# Patient Record
Sex: Male | Born: 1945 | Race: White | Hispanic: No | Marital: Married | Smoking: Never smoker
Health system: Southern US, Community
[De-identification: ages and names within clinical notes are randomized; demographics above are authoritative.]

## PROBLEM LIST (undated history)

## (undated) DIAGNOSIS — N200 Calculus of kidney: Secondary | ICD-10-CM

## (undated) DIAGNOSIS — I10 Essential (primary) hypertension: Secondary | ICD-10-CM

## (undated) DIAGNOSIS — M109 Gout, unspecified: Secondary | ICD-10-CM

## (undated) DIAGNOSIS — M1990 Unspecified osteoarthritis, unspecified site: Secondary | ICD-10-CM

## (undated) DIAGNOSIS — C4490 Unspecified malignant neoplasm of skin, unspecified: Secondary | ICD-10-CM

## (undated) DIAGNOSIS — C7951 Secondary malignant neoplasm of bone: Secondary | ICD-10-CM

## (undated) HISTORY — DX: Unspecified osteoarthritis, unspecified site: M19.90

## (undated) HISTORY — DX: Essential (primary) hypertension: I10

## (undated) HISTORY — PX: OTHER SURGICAL HISTORY: SHX169

## (undated) HISTORY — DX: Calculus of kidney: N20.0

## (undated) HISTORY — DX: Unspecified malignant neoplasm of skin, unspecified: C44.90

## (undated) HISTORY — DX: Gout, unspecified: M10.9

## (undated) HISTORY — DX: Secondary malignant neoplasm of bone: C79.51

---

## 2011-12-07 ENCOUNTER — Encounter: Payer: Self-pay | Admitting: Cardiology

## 2011-12-20 ENCOUNTER — Ambulatory Visit (INDEPENDENT_AMBULATORY_CARE_PROVIDER_SITE_OTHER): Payer: Medicare Other | Admitting: Cardiology

## 2011-12-20 ENCOUNTER — Telehealth: Payer: Self-pay

## 2011-12-20 ENCOUNTER — Encounter: Payer: Self-pay | Admitting: *Deleted

## 2011-12-20 ENCOUNTER — Other Ambulatory Visit: Payer: Self-pay | Admitting: *Deleted

## 2011-12-20 ENCOUNTER — Encounter: Payer: Self-pay | Admitting: Cardiology

## 2011-12-20 VITALS — BP 129/78 | HR 144 | Ht 72.0 in | Wt 261.0 lb

## 2011-12-20 DIAGNOSIS — Z136 Encounter for screening for cardiovascular disorders: Secondary | ICD-10-CM

## 2011-12-20 DIAGNOSIS — I4891 Unspecified atrial fibrillation: Secondary | ICD-10-CM

## 2011-12-20 MED ORDER — RIVAROXABAN 15 MG PO TABS: 15.0000 mg | ORAL_TABLET | Freq: Every day | ORAL | Status: DC

## 2011-12-20 MED ORDER — DILTIAZEM HCL ER COATED BEADS 180 MG PO CP24: 180.0000 mg | ORAL_CAPSULE | Freq: Every day | ORAL | Status: DC

## 2011-12-20 NOTE — Progress Notes (Addendum)
HPI The patient presents for evaluation of atrial fibrillation. This was newly diagnosed in early September by his primary provider. He was started on Xarelto at that time. This was found incidentally at the time of her routine followup. Even in retrospect he does not feel palpitations, presyncope or syncope. He pushes a lawnmower a couple of times weekly. With this he denies any symptoms with decreased exercise tolerance. He has no shortness of breath, PND or orthopnea. He denies any chest pressure, neck or arm discomfort. He has had no weight gain or edema. He has had no prior cardiac workup. He tolerates the anticoagulation without any suggestion of GI bleeding.   No Known Allergies  Current Outpatient Prescriptions  Medication Sig Dispense Refill  . allopurinol (ZYLOPRIM) 300 MG tablet Take 300 mg by mouth daily.      Marland Kitchen amLODipine (NORVASC) 10 MG tablet Take 10 mg by mouth daily.      . flurazepam (DALMANE) 30 MG capsule Take 30 mg by mouth at bedtime.      . gabapentin (NEURONTIN) 300 MG capsule Take 300 mg by mouth 3 (three) times daily.      Marland Kitchen lisinopril (PRINIVIL,ZESTRIL) 40 MG tablet Take 40 mg by mouth 2 (two) times daily.      . Melatonin 5 MG CAPS Take 10 mg by mouth daily.      . Multiple Vitamin (MULTIVITAMIN) tablet Take 1 tablet by mouth daily.      . Rivaroxaban (XARELTO) 15 MG TABS tablet Take 15 mg by mouth daily.        Past Medical History  Diagnosis Date  . Arthritis, degenerative   . Gout, unspecified   . Unspecified essential hypertension   . Calculus of kidney     Past Surgical History  Procedure Date  . None     Family History  Problem Relation Age of Onset  . Heart disease Mother   . Colon cancer Mother   . Alcohol abuse Father     Cause of death is Suicide    History   Social History  . Marital Status: Married    Spouse Name: N/A    Number of Children: 1  . Years of Education: N/A   Occupational History  . Retired    Social History  Main Topics  . Smoking status: Never Smoker   . Smokeless tobacco: Not on file  . Alcohol Use: No  . Drug Use: No  . Sexually Active: Not on file   Other Topics Concern  . Not on file   Social History Narrative  . No narrative on file    ROS:  Positive for seasonal allergies , easy bruising, foot pain, decreased sleep.   Otherwise as stated in the HPI and negative for all other systems.  PHYSICAL EXAM BP 129/78  Pulse 144  Ht 6' (1.829 m)  Wt 118.389 kg (261 lb)  BMI 35.40 kg/m2 GENERAL:  Well appearing HEENT:  Pupils equal round and reactive, fundi not visualized, oral mucosa unremarkable, upper dentures NECK:  No jugular venous distention, waveform within normal limits, carotid upstroke brisk and symmetric, no bruits, no thyromegaly LYMPHATICS:  No cervical, inguinal adenopathy LUNGS:  Clear to auscultation bilaterally BACK:  No CVA tenderness CHEST:  Unremarkable HEART:  PMI not displaced or sustained,S1 and S2 within normal limits, no S3,no clicks, no rubs, no murmurs, irregular ABD:  Flat, positive bowel sounds normal in frequency in pitch, no bruits, no rebound, no guarding, no midline pulsatile  mass, no hepatomegaly, no splenomegaly, obese EXT:  2 plus pulses throughout, no edema, no cyanosis no clubbing SKIN:  No rashes no nodules NEURO:  Cranial nerves II through XII grossly intact, motor grossly intact throughout PSYCH:  Cognitively intact, oriented to person place and time  EKG:  Atrial fibrillation, rate 144, axis WNL, intervals WNL. Low voltage. Poor anterior R wave progression, nonspecific ST-T wave changes.  12/20/2011  ASSESSMENT AND PLAN   Atrial fibrillation - We had a long discussion about this. I will plan cardioversion. He has been on Xarelto since early September. I will first change Norvasc to Cardizem 180 mg daily. I will check an echocardiogram. He will be scheduled for elective cardioversion. I have discussed the risks benefits of this. Of note his  CHADS score is only one and he would likely not need long-term anticoagulation.  HTN - His blood pressure will be treated in the context of rate control for his arrhythmia.  Obesity - I will begin to educate him about weight loss with diet and exercise.

## 2011-12-20 NOTE — Telephone Encounter (Signed)
No precert required 

## 2011-12-20 NOTE — Telephone Encounter (Signed)
:    DCCV on 12/29/11 with Hochrein at [facility] dx:[id number]

## 2011-12-20 NOTE — Patient Instructions (Addendum)
Your physician recommends that you schedule a follow-up appointment after cardioversion completed. Your physician has recommended you make the following change in your medication: Stop norvasc and start cardizem cd 180 mg daily. All other medications will remain the same. Your new prescription has been sent to your pharmacy. Your physician has requested that you have an echocardiogram. Echocardiography is a painless test that uses sound waves to create images of your heart. It provides your doctor with information about the size and shape of your heart and how well your heart's chambers and valves are working. This procedure takes approximately one hour. There are no restrictions for this procedure. Your physician has recommended that you have a Cardioversion (DCCV). Electrical Cardioversion uses a jolt of electricity to your heart either through paddles or wired patches attached to your chest. This is a controlled, usually prescheduled, procedure. Defibrillation is done under light anesthesia in the hospital, and you usually go home the day of the procedure. This is done to get your heart back into a normal rhythm. You are not awake for the procedure. Please see the instruction sheet given to you today.

## 2011-12-23 ENCOUNTER — Other Ambulatory Visit: Payer: Self-pay

## 2011-12-23 ENCOUNTER — Other Ambulatory Visit (INDEPENDENT_AMBULATORY_CARE_PROVIDER_SITE_OTHER): Payer: Medicare Other

## 2011-12-23 DIAGNOSIS — Z136 Encounter for screening for cardiovascular disorders: Secondary | ICD-10-CM

## 2011-12-23 DIAGNOSIS — I4891 Unspecified atrial fibrillation: Secondary | ICD-10-CM

## 2011-12-27 ENCOUNTER — Telehealth: Payer: Self-pay | Admitting: Cardiology

## 2011-12-27 ENCOUNTER — Telehealth: Payer: Self-pay | Admitting: *Deleted

## 2011-12-27 NOTE — Telephone Encounter (Signed)
Patient said that he is schedule to have procedure at hospital at 9 on Oct 16th and got a call that he is scheduled to see Hochrein 10/16 @ 300  Patient would like to know if he needs to come to Doctor appt

## 2011-12-27 NOTE — Telephone Encounter (Signed)
Patient informed and copy sent to PCP. 

## 2011-12-27 NOTE — Telephone Encounter (Signed)
Spoke with [name]. He understands about appointment for 12-29-2011.

## 2011-12-27 NOTE — Telephone Encounter (Signed)
Message copied by Eustace Moore on Mon Dec 27, 2011  3:45 PM ------      Message from: Meredeth Ide, PAMELA J      Created: Mon Dec 27, 2011 10:18 AM                   ----- Message -----         From: Rollene Rotunda, MD         Sent: 12/27/2011   1:12 AM           To: Rocco Serene, RN            His LA is large but otherwise no acute abnormalities.  Call Mr. Episcopo with the results and send results to [place], [place]

## 2011-12-29 ENCOUNTER — Ambulatory Visit: Payer: Medicare Other | Admitting: Cardiology

## 2011-12-29 DIAGNOSIS — I4891 Unspecified atrial fibrillation: Secondary | ICD-10-CM

## 2011-12-30 ENCOUNTER — Telehealth: Payer: Self-pay | Admitting: *Deleted

## 2011-12-30 NOTE — Telephone Encounter (Signed)
Per Judeth Cornfield, patient doesn't want to be seen at the [facility] office. Awaiting response from MD about whom to schedule patient with.

## 2011-12-30 NOTE — Telephone Encounter (Signed)
Staff message sent to scheduler to call pt to see if he can come in tomorrow afternoon to see Dr Antoine Poche.

## 2011-12-31 NOTE — Telephone Encounter (Signed)
Have somebody double book with me per Dr. Antoine Poche. Spoke with Meka at [facility]. Office and was given appointment for Friday 01/07/12 @4 :15pm. Nurse called and informed wife this morning.

## 2012-01-03 ENCOUNTER — Telehealth: Payer: Self-pay | Admitting: *Deleted

## 2012-01-03 NOTE — Telephone Encounter (Signed)
Message copied by Eustace Moore on Mon Jan 03, 2012  3:21 PM ------      Message from: Rollene Rotunda      Created: Fri Dec 31, 2011  2:12 PM       [facility] [place].  Call Mr. Carothers with the results and send results to [place], [place]

## 2012-01-03 NOTE — Telephone Encounter (Signed)
Patient informed via message machine. Copy sent to PCP.

## 2012-01-07 ENCOUNTER — Ambulatory Visit (INDEPENDENT_AMBULATORY_CARE_PROVIDER_SITE_OTHER): Payer: Medicare Other | Admitting: Cardiology

## 2012-01-07 ENCOUNTER — Encounter: Payer: Self-pay | Admitting: Cardiology

## 2012-01-07 VITALS — BP 152/94 | HR 104 | Ht 72.0 in | Wt 264.0 lb

## 2012-01-07 DIAGNOSIS — I4891 Unspecified atrial fibrillation: Secondary | ICD-10-CM

## 2012-01-07 MED ORDER — DILTIAZEM HCL ER COATED BEADS 240 MG PO CP24: 240.0000 mg | ORAL_CAPSULE | Freq: Every day | ORAL | Status: DC

## 2012-01-07 NOTE — Patient Instructions (Addendum)
Please increase your Diltiazem to 240 mg a day. Continue all other medications as listed.  Your physician has recommended that you wear a holter monitor in 2 weeks.  This will be placed in the [place] office.  Holter monitors are medical devices that record the heart's electrical activity. Doctors most often use these monitors to diagnose arrhythmias. Arrhythmias are problems with the speed or rhythm of the heartbeat. The monitor is a small, portable device. You can wear one while you do your normal daily activities. This is usually used to diagnose what is causing palpitations/syncope (passing out).  Follow up with Dr Antoine Poche in [place] in 2 months.

## 2012-01-07 NOTE — Progress Notes (Signed)
HPI The patient presents for evaluation of atrial fibrillation. Unfortunately he failed cardioversion recently. He does not feel palpitations. He has no presyncope or syncope. He doesn't think it has to tiredness. He has some chronic dyspnea with exertion but this is unchanged. He's not having any PND or orthopnea. He has had no weight gain or edema. He has had no chest pressure, neck or arm discomfort. He does have stress at home.  No Known Allergies  Current Outpatient Prescriptions  Medication Sig Dispense Refill  . allopurinol (ZYLOPRIM) 300 MG tablet Take 300 mg by mouth daily.      Marland Kitchen diltiazem (CARDIZEM CD) 180 MG 24 hr capsule Take 1 capsule (180 mg total) by mouth daily.  30 capsule  6  . flurazepam (DALMANE) 30 MG capsule Take 30 mg by mouth at bedtime.      . gabapentin (NEURONTIN) 300 MG capsule Take 300 mg by mouth 3 (three) times daily.      Marland Kitchen lisinopril (PRINIVIL,ZESTRIL) 40 MG tablet Take 40 mg by mouth 2 (two) times daily.      . Melatonin 5 MG CAPS Take 10 mg by mouth daily.      . Multiple Vitamin (MULTIVITAMIN) tablet Take 1 tablet by mouth daily.      . Rivaroxaban (XARELTO) 15 MG TABS tablet Take 1 tablet (15 mg total) by mouth daily.  15 tablet  0  . Specialty Vitamins Products (MAGNESIUM, AMINO ACID CHELATE,) 133 MG tablet Take 1 tablet by mouth daily.        Past Medical History  Diagnosis Date  . Arthritis, degenerative   . Gout, unspecified   . Unspecified essential hypertension   . Calculus of kidney     Past Surgical History  Procedure Date  . None     ROS:  Positive for seasonal allergies , easy bruising, foot pain, decreased sleep.   Otherwise as stated in the HPI and negative for all other systems.  PHYSICAL EXAM BP 152/94  Pulse 104  Ht 6' (1.829 m)  Wt 264 lb (119.75 kg)  BMI 35.80 kg/m2 GENERAL:  Well appearing HEENT:  Pupils equal round and reactive, fundi not visualized, oral mucosa unremarkable, upper dentures NECK:  No jugular venous  distention, waveform within normal limits, carotid upstroke brisk and symmetric, no bruits, no thyromegaly LUNGS:  Clear to auscultation bilaterally BACK:  No CVA tenderness CHEST:  Unremarkable HEART:  PMI not displaced or sustained,S1 and S2 within normal limits, no S3,no clicks, no rubs, no murmurs, irregular ABD:  Flat, positive bowel sounds normal in frequency in pitch, no bruits, no rebound, no guarding, no midline pulsatile mass, no hepatomegaly, no splenomegaly, obese EXT:  2 plus pulses throughout, no edema, no cyanosis no clubbing SKIN:  Ruddy   ASSESSMENT AND PLAN   Atrial fibrillation - I had a long discussion with the patient about this. His CHADS score is only 1 giving him a low thromboembolic risk. At this point this does not require anticoagulation though it can still be an elective consideration. We will choose to come off of the Xarelto.  We discussed rhythm versus rate management. Since he has no symptoms related to this he would prefer rate management. I will increase his Cardizem to 240 mg daily. He will get a Holter monitor in 2 weeks to judge rate control. I will see him back after this.  HTN - This is being treated in the context of treating his atrial fibrillation.  Obesity - We will  continue to discuss weight control.

## 2012-01-19 ENCOUNTER — Encounter: Payer: Medicare Other | Admitting: Cardiology

## 2012-01-24 ENCOUNTER — Encounter (INDEPENDENT_AMBULATORY_CARE_PROVIDER_SITE_OTHER): Payer: Medicare Other | Admitting: *Deleted

## 2012-01-24 DIAGNOSIS — I4891 Unspecified atrial fibrillation: Secondary | ICD-10-CM

## 2012-01-24 NOTE — Progress Notes (Unsigned)
Patient in office this morning.  24 hour holter monitor placed & instructions given.

## 2012-01-26 ENCOUNTER — Encounter: Payer: Medicare Other | Admitting: Cardiology

## 2012-02-07 ENCOUNTER — Telehealth: Payer: Self-pay | Admitting: *Deleted

## 2012-02-07 MED ORDER — DILTIAZEM HCL ER COATED BEADS 360 MG PO CP24: 360.0000 mg | ORAL_CAPSULE | Freq: Every day | ORAL | Status: DC

## 2012-02-07 NOTE — Telephone Encounter (Signed)
Patient informed and new prescription sent to pharmacy. Appointment scheduled for February 29, 2012 @10 :45 am at [facility] Office. Patient aware of appointment.

## 2012-02-07 NOTE — Telephone Encounter (Signed)
Message copied by Eustace Moore on Mon Feb 07, 2012 11:44 AM ------      Message from: Rande Brunt      Created: Mon Feb 07, 2012 11:37 AM      Regarding: RE: message from [place]       Just spoke with Dr Antoine Poche. Please verify that pt has been taking increased dose of Cardizem 240 daily, as outlined at last OV. If so, we can further increase it to 360 daily. Also, Dr Antoine Poche would like to move up pt's scheduled f/u with him, to December.            ----- Message -----         From: Eustace Moore, LPN         Sent: 02/07/2012  10:53 AM           To: Prescott Parma, PA      Subject: FW: message from [facility]                                                ----- Message -----         From: Rollene Rotunda, MD         Sent: 02/06/2012  11:20 AM           To: Eustace Moore, LPN      Subject: FW: message from [place]                                    I could not get the strips you wanted me to see.  Can you please have Gene call me to discuss.      ----- Message -----         From: Anette Guarneri         Sent: 02/01/2012   9:33 AM           To: Rollene Rotunda, MD      Subject: message from [facility]                                        Dr. Johny Drilling from [place] office called and wants you to check your [place] e-mail she sent you a message regarding the above mentioned pt and the holtor results that need to be reviewed by you.

## 2012-02-29 ENCOUNTER — Ambulatory Visit (INDEPENDENT_AMBULATORY_CARE_PROVIDER_SITE_OTHER): Payer: Medicare Other | Admitting: Cardiology

## 2012-02-29 ENCOUNTER — Encounter: Payer: Self-pay | Admitting: Cardiology

## 2012-02-29 VITALS — BP 145/70 | HR 107 | Ht 72.0 in | Wt 262.8 lb

## 2012-02-29 DIAGNOSIS — I4891 Unspecified atrial fibrillation: Secondary | ICD-10-CM

## 2012-02-29 MED ORDER — METOPROLOL SUCCINATE ER 50 MG PO TB24: 50.0000 mg | ORAL_TABLET | Freq: Every day | ORAL | Status: DC

## 2012-02-29 NOTE — Progress Notes (Signed)
HPI The patient presents for evaluation of atrial fibrillation. Unfortunately he failed cardioversion recently. He does not feel palpitations. He has no presyncope or syncope. He doesn't think it has to tiredness. He has some chronic dyspnea with exertion but this is unchanged. He's not having any PND or orthopnea. He has had no weight gain or edema. He has had no chest pressure, neck or arm discomfort. He does have stress at home.  He did wear a Holter monitor demonstrating his rate was not well controlled. At that time I increased his Cardizem to the current dose. Today I walked him around the office and his heart rate with exertion went up to 150. He doesn't really feel this.  No Known Allergies  Current Outpatient Prescriptions  Medication Sig Dispense Refill  . allopurinol (ZYLOPRIM) 300 MG tablet Take 300 mg by mouth daily.      Marland Kitchen diltiazem (CARDIZEM CD) 360 MG 24 hr capsule Take 1 capsule (360 mg total) by mouth daily.  30 capsule  3  . flurazepam (DALMANE) 30 MG capsule Take 30 mg by mouth at bedtime.      . gabapentin (NEURONTIN) 300 MG capsule Take 300 mg by mouth 3 (three) times daily.      Marland Kitchen lisinopril (PRINIVIL,ZESTRIL) 40 MG tablet Take 40 mg by mouth 2 (two) times daily.      . Melatonin 5 MG CAPS Take 10 mg by mouth daily.      . Multiple Vitamin (MULTIVITAMIN) tablet Take 1 tablet by mouth daily.      Marland Kitchen Specialty Vitamins Products (MAGNESIUM, AMINO ACID CHELATE,) 133 MG tablet Take 1 tablet by mouth daily.        Past Medical History  Diagnosis Date  . Arthritis, degenerative   . Gout, unspecified   . Unspecified essential hypertension   . Calculus of kidney     Past Surgical History  Procedure Date  . None     ROS:  Positive for seasonal allergies , easy bruising, foot pain, decreased sleep.   Otherwise as stated in the HPI and negative for all other systems.  PHYSICAL EXAM BP 145/70  Pulse 107  Ht 6' (1.829 m)  Wt 262 lb 12.8 oz (119.205 kg)  BMI 35.64  kg/m2 GENERAL:  Well appearing HEENT:  Pupils equal round and reactive, fundi not visualized, oral mucosa unremarkable, upper dentures NECK:  No jugular venous distention, waveform within normal limits, carotid upstroke brisk and symmetric, no bruits, no thyromegaly LUNGS:  Clear to auscultation bilaterally BACK:  No CVA tenderness CHEST:  Unremarkable HEART:  PMI not displaced or sustained,S1 and S2 within normal limits, no S3,no clicks, no rubs, no murmurs, irregular ABD:  Flat, positive bowel sounds normal in frequency in pitch, no bruits, no rebound, no guarding, no midline pulsatile mass, no hepatomegaly, no splenomegaly, obese EXT:  2 plus pulses throughout, no edema, no cyanosis no clubbing SKIN:  Ruddy   ASSESSMENT AND PLAN   Atrial fibrillation - I had a long discussion with the patient about this. His CHADS score is only 1 with anticoagulation suggested as a IIB recommendation. He would prefer not to take anticoagulation.  We will discuss the benefit of aspirin Plavix. For now he is going to need better rate control and I will add metoprolol 50 mg daily. A likely followup with an Holter monitor again in the future to make sure adequate rate control.  HTN - This is being treated in the context of treating his atrial fibrillation.  Obesity - We will continue to discuss weight control.

## 2012-02-29 NOTE — Patient Instructions (Addendum)
Please start Metoprolol 50 mg daily Continue all other medications as listed.  Follow up in [place] in 2 to 3 weeks with Dr Antoine Poche.

## 2012-03-16 ENCOUNTER — Ambulatory Visit: Payer: Medicare Other | Admitting: Cardiology

## 2012-03-17 ENCOUNTER — Ambulatory Visit (INDEPENDENT_AMBULATORY_CARE_PROVIDER_SITE_OTHER): Payer: Medicare Other | Admitting: Cardiology

## 2012-03-17 ENCOUNTER — Encounter: Payer: Self-pay | Admitting: Cardiology

## 2012-03-17 VITALS — BP 125/73 | HR 124 | Ht 72.0 in | Wt 265.4 lb

## 2012-03-17 DIAGNOSIS — I4891 Unspecified atrial fibrillation: Secondary | ICD-10-CM

## 2012-03-17 MED ORDER — RIVAROXABAN 20 MG PO TABS: 20.0000 mg | ORAL_TABLET | Freq: Every day | ORAL | Status: DC

## 2012-03-17 MED ORDER — METOPROLOL SUCCINATE ER 50 MG PO TB24: 50.0000 mg | ORAL_TABLET | Freq: Two times a day (BID) | ORAL | Status: DC

## 2012-03-17 NOTE — Progress Notes (Deleted)
HPI The patient presents for evaluation of atrial fibrillation. Unfortunately he failed cardioversion recently. He does not feel palpitations. He has no presyncope or syncope. At the last visit I did increase his Cardizem as he did not have good rate control. His resting heart rate today is much better. He has felt well with an we increased the dose of Cardizem. He denies any palpitations, presyncope or syncope. He has had no chest pressure, neck or arm discomfort. He has had no new shortness of breath, PND or orthopnea. He did have skin cancers removed the other day.  No Known Allergies  Current Outpatient Prescriptions  Medication Sig Dispense Refill  . allopurinol (ZYLOPRIM) 300 MG tablet Take 300 mg by mouth daily.      Marland Kitchen diltiazem (CARDIZEM CD) 360 MG 24 hr capsule Take 1 capsule (360 mg total) by mouth daily.  30 capsule  3  . flurazepam (DALMANE) 30 MG capsule Take 30 mg by mouth at bedtime.      . gabapentin (NEURONTIN) 300 MG capsule Take 300 mg by mouth 3 (three) times daily.      Marland Kitchen lisinopril (PRINIVIL,ZESTRIL) 40 MG tablet Take 40 mg by mouth 2 (two) times daily.      . Melatonin 5 MG CAPS Take 10 mg by mouth daily.      . metoprolol succinate (TOPROL-XL) 50 MG 24 hr tablet Take 1 tablet (50 mg total) by mouth daily. Take with or immediately following a meal.  90 tablet  3  . Multiple Vitamin (MULTIVITAMIN) tablet Take 1 tablet by mouth daily.      Marland Kitchen Specialty Vitamins Products (MAGNESIUM, AMINO ACID CHELATE,) 133 MG tablet Take 1 tablet by mouth daily.        Past Medical History  Diagnosis Date  . Arthritis, degenerative   . Gout, unspecified   . Unspecified essential hypertension   . Calculus of kidney     Past Surgical History  Procedure Date  . None     ROS:  Positive for seasonal allergies.  Dry mouth.   Otherwise as stated in the HPI and negative for all other systems.  PHYSICAL EXAM BP 125/73  Pulse 82  Ht 6' (1.829 m)  Wt 265 lb 6.4 oz (120.385 kg)   BMI 35.99 kg/m2 GENERAL:  Well appearing HEENT:  Pupils equal round and reactive, fundi not visualized, oral mucosa unremarkable, upper dentures NECK:  No jugular venous distention, waveform within normal limits, carotid upstroke brisk and symmetric, no bruits, no thyromegaly LUNGS:  Clear to auscultation bilaterally BACK:  No CVA tenderness CHEST:  Unremarkable HEART:  PMI not displaced or sustained,S1 and S2 within normal limits, no S3,no clicks, no rubs, no murmurs, irregular ABD:  Flat, positive bowel sounds normal in frequency in pitch, no bruits, no rebound, no guarding, no midline pulsatile mass, no hepatomegaly, no splenomegaly, obese EXT:  2 plus pulses throughout, no edema, no cyanosis no clubbing SKIN:  Ruddy   ASSESSMENT AND PLAN   Atrial fibrillation - We had another long discussion about this.  [name] has a CHA2DS2 - VASc score of 2 with a risk of stroke of 2.2.  Given this current recommendations are for anticoagulation and so he will start back on Xarelto.  We have carefully discussed the risks benefits of this. His heart rate walking in the office still went up to 124.  I will increase the beta blocker to 50 mg bid.    HTN - This is being  treated in the context of treating his atrial fibrillation.  Obesity - We will continue to discuss weight control.

## 2012-03-17 NOTE — Progress Notes (Signed)
HPI The patient presents for evaluation of atrial fibrillation. Unfortunately he failed cardioversion recently. He does not feel palpitations. He has no presyncope or syncope. He doesn't think it has to tiredness. He has some chronic dyspnea with exertion but this is unchanged. He's not having any PND or orthopnea. He has had no weight gain or edema. He has had no chest pressure, neck or arm discomfort. He does have stress at home.  He did wear a Holter monitor demonstrating his rate was not well controlled. At that time I increased his Cardizem to the current dose. Today I walked him around the office and his heart rate with exertion went up to 150. He doesn't really feel this.  No Known Allergies  Current Outpatient Prescriptions  Medication Sig Dispense Refill  . allopurinol (ZYLOPRIM) 300 MG tablet Take 300 mg by mouth daily.      Marland Kitchen diltiazem (CARDIZEM CD) 360 MG 24 hr capsule Take 1 capsule (360 mg total) by mouth daily.  30 capsule  3  . flurazepam (DALMANE) 30 MG capsule Take 30 mg by mouth at bedtime.      . gabapentin (NEURONTIN) 300 MG capsule Take 300 mg by mouth 3 (three) times daily.      Marland Kitchen lisinopril (PRINIVIL,ZESTRIL) 40 MG tablet Take 40 mg by mouth 2 (two) times daily.      . Melatonin 5 MG CAPS Take 10 mg by mouth daily.      . metoprolol succinate (TOPROL-XL) 50 MG 24 hr tablet Take 1 tablet (50 mg total) by mouth daily. Take with or immediately following a meal.  90 tablet  3  . Multiple Vitamin (MULTIVITAMIN) tablet Take 1 tablet by mouth daily.      Marland Kitchen Specialty Vitamins Products (MAGNESIUM, AMINO ACID CHELATE,) 133 MG tablet Take 1 tablet by mouth daily.        Past Medical History  Diagnosis Date  . Arthritis, degenerative   . Gout, unspecified   . Unspecified essential hypertension   . Calculus of kidney     Past Surgical History  Procedure Date  . None     ROS:  Positive for seasonal allergies , easy bruising, foot pain, decreased sleep.   Otherwise as  stated in the HPI and negative for all other systems.  PHYSICAL EXAM BP 125/73  Pulse 82  Ht 6' (1.829 m)  Wt 265 lb 6.4 oz (120.385 kg)  BMI 35.99 kg/m2 GENERAL:  Well appearing HEENT:  Pupils equal round and reactive, fundi not visualized, oral mucosa unremarkable, upper dentures NECK:  No jugular venous distention, waveform within normal limits, carotid upstroke brisk and symmetric, no bruits, no thyromegaly LUNGS:  Clear to auscultation bilaterally BACK:  No CVA tenderness CHEST:  Unremarkable HEART:  PMI not displaced or sustained,S1 and S2 within normal limits, no S3,no clicks, no rubs, no murmurs, irregular ABD:  Flat, positive bowel sounds normal in frequency in pitch, no bruits, no rebound, no guarding, no midline pulsatile mass, no hepatomegaly, no splenomegaly, obese EXT:  2 plus pulses throughout, no edema, no cyanosis no clubbing SKIN:  Ruddy   ASSESSMENT AND PLAN   Atrial fibrillation - I had a long discussion with the patient about this. His CHADS score is only 1 with anticoagulation suggested as a IIB recommendation. He would prefer not to take anticoagulation.  We will discuss the benefit of aspirin Plavix. For now he is going to need better rate control and I will add metoprolol 50 mg daily. A  likely followup with an Holter monitor again in the future to make sure adequate rate control.  HTN - This is being treated in the context of treating his atrial fibrillation.  Obesity - We will continue to discuss weight control.

## 2012-03-17 NOTE — Patient Instructions (Addendum)
Your physician recommends that you schedule a follow-up appointment in: 6 weeks. Your physician has recommended you make the following change in your medication: Increase metoprolol to 50 mg twice daily. Start xarelto 20 mg daily. Your new prescriptions have been sent to your pharmacy. All other medications will remain the same.

## 2012-05-12 ENCOUNTER — Ambulatory Visit (INDEPENDENT_AMBULATORY_CARE_PROVIDER_SITE_OTHER): Payer: Medicare Other | Admitting: Cardiology

## 2012-05-12 ENCOUNTER — Encounter: Payer: Self-pay | Admitting: Cardiology

## 2012-05-12 VITALS — BP 115/72 | HR 66 | Ht 72.0 in | Wt 266.0 lb

## 2012-05-12 DIAGNOSIS — I4891 Unspecified atrial fibrillation: Secondary | ICD-10-CM

## 2012-05-12 NOTE — Progress Notes (Signed)
.     HPI The patient presents for evaluation of atrial fibrillation and he failed cardioversion . He does not feel palpitations. He has no presyncope or syncope.  He has some chronic dyspnea with exertion but this is unchanged. He's not having any PND or orthopnea. He has had no weight gain or edema. He has had no chest pressure, neck or arm discomfort. He seems to have good rate control as we have adjusted his meds.    No Known Allergies  Current Outpatient Prescriptions  Medication Sig Dispense Refill  . allopurinol (ZYLOPRIM) 300 MG tablet Take 300 mg by mouth daily.      Marland Kitchen diltiazem (CARDIZEM CD) 360 MG 24 hr capsule Take 1 capsule (360 mg total) by mouth daily.  30 capsule  3  . flurazepam (DALMANE) 30 MG capsule Take 30 mg by mouth at bedtime.      . gabapentin (NEURONTIN) 300 MG capsule Take 300 mg by mouth 3 (three) times daily.      Marland Kitchen lisinopril (PRINIVIL,ZESTRIL) 40 MG tablet Take 40 mg by mouth 2 (two) times daily.      . Melatonin 5 MG CAPS Take 10 mg by mouth daily.      . metoprolol succinate (TOPROL-XL) 50 MG 24 hr tablet Take 1 tablet (50 mg total) by mouth 2 (two) times daily. Take with or immediately following a meal.  60 tablet  3  . Multiple Vitamin (MULTIVITAMIN) tablet Take 1 tablet by mouth daily.      . Rivaroxaban (XARELTO) 20 MG TABS Take 1 tablet (20 mg total) by mouth daily.  10 tablet  0  . Specialty Vitamins Products (MAGNESIUM, AMINO ACID CHELATE,) 133 MG tablet Take 1 tablet by mouth daily.       No current facility-administered medications for this visit.    Past Medical History  Diagnosis Date  . Arthritis, degenerative   . Gout, unspecified   . Unspecified essential hypertension   . Calculus of kidney     Past Surgical History  Procedure Laterality Date  . None      ROS:  As stated in the HPI and negative for all other systems.  PHYSICAL EXAM BP 115/72  Pulse 66  Ht 6' (1.829 m)  Wt 266 lb (120.657 kg)  BMI 36.07 kg/m2 GENERAL:  Well  appearing HEENT:  Pupils equal round and reactive, fundi not visualized, oral mucosa unremarkable, upper dentures NECK:  No jugular venous distention, waveform within normal limits, carotid upstroke brisk and symmetric, no bruits, no thyromegaly LUNGS:  Clear to auscultation bilaterally BACK:  No CVA tenderness CHEST:  Unremarkable HEART:  PMI not displaced or sustained,S1 and S2 within normal limits, no S3,no clicks, no rubs, no murmurs, irregular ABD:  Flat, positive bowel sounds normal in frequency in pitch, no bruits, no rebound, no guarding, no midline pulsatile mass, no hepatomegaly, no splenomegaly, obese EXT:  2 plus pulses throughout, no edema, no cyanosis no clubbing SKIN:  Ruddy   ASSESSMENT AND PLAN   Atrial fibrillation - I had a long discussion with the patient about this. His CHADS score is 1.  We opted for anticoagulation.  He is tolerating this.  No change in therapy is indicated.   HTN - This is being treated in the context of treating his atrial fibrillation.  Obesity - We will continue to discuss weight control.

## 2012-05-12 NOTE — Patient Instructions (Addendum)

## 2012-05-29 ENCOUNTER — Other Ambulatory Visit: Payer: Self-pay | Admitting: Cardiology

## 2012-05-30 ENCOUNTER — Other Ambulatory Visit: Payer: Self-pay | Admitting: *Deleted

## 2012-05-30 MED ORDER — DILTIAZEM HCL ER COATED BEADS 360 MG PO CP24: 360.0000 mg | ORAL_CAPSULE | Freq: Every day | ORAL | Status: DC

## 2012-07-08 ENCOUNTER — Other Ambulatory Visit: Payer: Self-pay | Admitting: Cardiology

## 2012-07-10 NOTE — Telephone Encounter (Signed)
..   Requested Prescriptions   Pending Prescriptions Disp Refills  . XARELTO 20 MG TABS [Pharmacy Med Name: XARELTO 20 MG TABLET] 30 tablet 6    Sig: TAKE 1 TABLET BY MOUTH EVERY DAY

## 2012-08-12 ENCOUNTER — Other Ambulatory Visit: Payer: Self-pay | Admitting: Cardiology

## 2012-09-27 ENCOUNTER — Other Ambulatory Visit: Payer: Self-pay | Admitting: Cardiology

## 2012-11-09 ENCOUNTER — Encounter: Payer: Self-pay | Admitting: Cardiology

## 2012-11-09 ENCOUNTER — Ambulatory Visit (INDEPENDENT_AMBULATORY_CARE_PROVIDER_SITE_OTHER): Payer: Medicare Other | Admitting: Cardiology

## 2012-11-09 VITALS — BP 142/85 | HR 95 | Ht 72.0 in | Wt 267.0 lb

## 2012-11-09 DIAGNOSIS — I4891 Unspecified atrial fibrillation: Secondary | ICD-10-CM

## 2012-11-09 MED ORDER — RIVAROXABAN 20 MG PO TABS: 20.0000 mg | ORAL_TABLET | Freq: Every day | ORAL | Status: DC

## 2012-11-09 NOTE — Progress Notes (Signed)
.  HPI The patient presents for evaluation of atrial fibrillation and he failed cardioversion . He does not feel palpitations. He has no presyncope or syncope.  He has some chronic dyspnea with exertion but this is unchanged. He's not having any PND or orthopnea. He has had no weight gain or edema. He has had no chest pressure, neck or arm discomfort.  He brings a blood pressure diary and it demonstrates well controlled heart rates but this is addressed further below.  His blood pressure is actually running low sometimes with systolics in the 90s though he's not had any syncope. He has had some orthostatic symptoms.  No Known Allergies  Current Outpatient Prescriptions  Medication Sig Dispense Refill  . allopurinol (ZYLOPRIM) 300 MG tablet Take 300 mg by mouth daily.      Marland Kitchen diltiazem (TIAZAC) 360 MG 24 hr capsule TAKE ONE CAPSULE BY MOUTH EVERY DAY  30 capsule  3  . flurazepam (DALMANE) 30 MG capsule Take 30 mg by mouth at bedtime.      . gabapentin (NEURONTIN) 300 MG capsule Take 300 mg by mouth 3 (three) times daily.      Marland Kitchen lisinopril (PRINIVIL,ZESTRIL) 40 MG tablet Take 40 mg by mouth 2 (two) times daily.      . Melatonin 5 MG CAPS Take 10 mg by mouth daily.      . metoprolol succinate (TOPROL-XL) 50 MG 24 hr tablet TAKE 1 TABLET BY MOUTH TWICE A DAY WITH FOOD  60 tablet  3  . Multiple Vitamin (MULTIVITAMIN) tablet Take 1 tablet by mouth daily.      Marland Kitchen Specialty Vitamins Products (MAGNESIUM, AMINO ACID CHELATE,) 133 MG tablet Take 1 tablet by mouth daily.      Carlena Hurl 20 MG TABS TAKE 1 TABLET BY MOUTH EVERY DAY  30 tablet  6   No current facility-administered medications for this visit.    Past Medical History  Diagnosis Date  . Arthritis, degenerative   . Gout, unspecified   . Unspecified essential hypertension   . Calculus of kidney     Past Surgical History  Procedure Laterality Date  . None      ROS:  As stated in the HPI and negative for all other systems.  PHYSICAL  EXAM BP 142/85  Pulse 95  Ht 6' (1.829 m)  Wt 267 lb (121.11 kg)  BMI 36.2 kg/m2  SpO2 97% GENERAL:  Well appearing HEENT:  Pupils equal round and reactive, fundi not visualized, oral mucosa unremarkable, upper dentures NECK:  No jugular venous distention, waveform within normal limits, carotid upstroke brisk and symmetric, no bruits, no thyromegaly LUNGS:  Clear to auscultation bilaterally BACK:  No CVA tenderness CHEST:  Unremarkable HEART:  PMI not displaced or sustained,S1 and S2 within normal limits, no S3,no clicks, no rubs, no murmurs, irregular ABD:  Flat, positive bowel sounds normal in frequency in pitch, no bruits, no rebound, no guarding, no midline pulsatile mass, no hepatomegaly, no splenomegaly, obese EXT:  2 plus pulses throughout, no edema, no cyanosis no clubbing SKIN:  No rash, no nodules   ASSESSMENT AND PLAN   Atrial fibrillation - I had a long discussion with the patient about this. His CHADS score is 1.  We have had long discussions about this. He is particularly worried about stroke and would at all costs like to avoid this so he is continuing anticoagulation. His heart rate at home is well controlled. However, just walking him around the office today demonstrated  that his heart rate went up easily. He may need further attempts at rhythm control with possibly consideration of ablation. I am going to put another 48 hour Holter monitor on him to see what his rate control is with usual activities before making this decision. He had a monitor in November of last year but since then has had med titration for rate control. I don't think I can go up on his medication because his blood pressure is low.  HTN - The blood pressure is at target. No change in medications is indicated. We will continue with therapeutic lifestyle changes (TLC).  Obesity - I again mentioned the need for weight loss.

## 2012-11-09 NOTE — Patient Instructions (Addendum)
Your physician recommends that you schedule a follow-up appointment in: 1 month. Your physician recommends that you continue on your current medications as directed. Please refer to the Current Medication list given to you today. Your physician has recommended that you wear a 48 hour holter monitor. Holter monitors are medical devices that record the heart's electrical activity. Doctors most often use these monitors to diagnose arrhythmias. Arrhythmias are problems with the speed or rhythm of the heartbeat. The monitor is a small, portable device. You can wear one while you do your normal daily activities. This is usually used to diagnose what is causing palpitations/syncope (passing out).   

## 2012-11-20 ENCOUNTER — Other Ambulatory Visit: Payer: Self-pay | Admitting: *Deleted

## 2012-11-20 ENCOUNTER — Ambulatory Visit (HOSPITAL_COMMUNITY)
Admission: RE | Admit: 2012-11-20 | Discharge: 2012-11-20 | Disposition: A | Discharge status: Home / Self Care | Payer: Medicare Other | Source: Ambulatory Visit | Attending: Cardiology | Admitting: Cardiology

## 2012-11-20 ENCOUNTER — Other Ambulatory Visit: Payer: Self-pay

## 2012-11-20 DIAGNOSIS — I4891 Unspecified atrial fibrillation: Secondary | ICD-10-CM

## 2012-11-20 NOTE — Progress Notes (Signed)
48 hour Holter Monitor in progress. 

## 2012-12-02 ENCOUNTER — Other Ambulatory Visit: Payer: Self-pay | Admitting: Cardiology

## 2012-12-25 ENCOUNTER — Telehealth: Payer: Self-pay | Admitting: *Deleted

## 2012-12-25 NOTE — Telephone Encounter (Signed)
Patient informed. 

## 2012-12-25 NOTE — Telephone Encounter (Signed)
Message copied by Eustace Moore on Mon Dec 25, 2012  3:28 PM ------      Message from: Meredeth Ide, PAMELA J      Created: Mon Dec 25, 2012  1:54 PM                   ----- Message -----         From: Rollene Rotunda, MD         Sent: 12/22/2012   7:12 PM           To: Rocco Serene, RN            He is in atrial fib but the rate is OK. Continue with the current therapy.  Call Mr. Vandergriff with the results and send results to [facility], PA-C       ------

## 2013-01-03 ENCOUNTER — Ambulatory Visit (INDEPENDENT_AMBULATORY_CARE_PROVIDER_SITE_OTHER): Payer: Medicare Other | Admitting: Cardiology

## 2013-01-03 ENCOUNTER — Encounter: Payer: Self-pay | Admitting: Cardiology

## 2013-01-03 ENCOUNTER — Telehealth: Payer: Self-pay | Admitting: *Deleted

## 2013-01-03 VITALS — BP 137/85 | HR 85 | Ht 72.0 in | Wt 271.0 lb

## 2013-01-03 DIAGNOSIS — I4891 Unspecified atrial fibrillation: Secondary | ICD-10-CM

## 2013-01-03 MED ORDER — RIVAROXABAN 20 MG PO TABS: 20.0000 mg | ORAL_TABLET | Freq: Every day | ORAL | Status: DC

## 2013-01-03 NOTE — Telephone Encounter (Signed)
Message copied by Eustace Moore on Wed Jan 03, 2013  2:24 PM ------      Message from: Rollene Rotunda      Created: Wed Jan 03, 2013  1:56 PM       If you have any Xarelto samples please call him and send it his way.  Thanks.   ------

## 2013-01-03 NOTE — Telephone Encounter (Signed)
Left message on machine at home number that samples of xarelto would be left up front.

## 2013-01-03 NOTE — Patient Instructions (Signed)
The current medical regimen is effective;  continue present plan and medications.  Follow up in 6 months with Dr Hochrein.  You will receive a letter in the mail 2 months before you are due.  Please call us when you receive this letter to schedule your follow up appointment.  

## 2013-01-03 NOTE — Progress Notes (Signed)
HPI The patient presents for evaluation of atrial fibrillation and he failed cardioversion . He does not feel palpitations. He has no presyncope or syncope.  He has some chronic dyspnea with exertion but this is unchanged. He's not having any PND or orthopnea. He has had no weight gain or edema. He has had no chest pressure, neck or arm discomfort.  Since I last saw him he wore a Holter monitor. This demonstrated good heart rate control with his fibrillation. He otherwise is doing well without new symptoms.  No Known Allergies  Current Outpatient Prescriptions  Medication Sig Dispense Refill  . allopurinol (ZYLOPRIM) 300 MG tablet Take 300 mg by mouth daily.      Marland Kitchen diltiazem (TIAZAC) 360 MG 24 hr capsule TAKE ONE CAPSULE BY MOUTH EVERY DAY  30 capsule  3  . flurazepam (DALMANE) 30 MG capsule Take 30 mg by mouth at bedtime.      . gabapentin (NEURONTIN) 300 MG capsule Take 300 mg by mouth 3 (three) times daily.      Marland Kitchen lisinopril (PRINIVIL,ZESTRIL) 40 MG tablet Take 40 mg by mouth 2 (two) times daily.      . Melatonin 5 MG CAPS Take 10 mg by mouth daily.      . metoprolol succinate (TOPROL-XL) 50 MG 24 hr tablet TAKE 1 TABLET BY MOUTH TWICE A DAY WITH FOOD  60 tablet  3  . Multiple Vitamin (MULTIVITAMIN) tablet Take 1 tablet by mouth daily.      . Rivaroxaban (XARELTO) 20 MG TABS tablet Take 1 tablet (20 mg total) by mouth daily.  90 tablet  0  . Specialty Vitamins Products (MAGNESIUM, AMINO ACID CHELATE,) 133 MG tablet Take 1 tablet by mouth daily.       No current facility-administered medications for this visit.    Past Medical History  Diagnosis Date  . Arthritis, degenerative   . Gout, unspecified   . Unspecified essential hypertension   . Calculus of kidney     Past Surgical History  Procedure Laterality Date  . None      ROS:  As stated in the HPI and negative for all other systems.  PHYSICAL EXAM BP 137/85  Pulse 85  Ht 6' (1.829 m)  Wt 271 lb (122.925 kg)  BMI  36.75 kg/m2 GENERAL:  Well appearing HEENT:  Pupils equal round and reactive, fundi not visualized, oral mucosa unremarkable, upper dentures NECK:  No jugular venous distention, waveform within normal limits, carotid upstroke brisk and symmetric, no bruits, no thyromegaly LUNGS:  Clear to auscultation bilaterally BACK:  No CVA tenderness CHEST:  Unremarkable HEART:  PMI not displaced or sustained,S1 and S2 within normal limits, no S3,no clicks, no rubs, no murmurs, irregular ABD:  Flat, positive bowel sounds normal in frequency in pitch, no bruits, no rebound, no guarding, no midline pulsatile mass, no hepatomegaly, no splenomegaly, obese EXT:  2 plus pulses throughout, no edema, no cyanosis no clubbing SKIN:  No rash, no nodules  EKG:  Atrial fib with slow ventricular rate, 68 poor anterior R wave progression, no acute ST-T wave changes.  ASSESSMENT AND PLAN  Atrial fibrillation - I had a long discussion with the patient about this. His CHADS score is 1.  We have had long discussions about this. As described previously we ultimately decided to use anticoagulation. He's doing well with this. I did place a 48 hour monitor and his rate is controlled. No change in therapy is suggested.  HTN - The blood  pressure is at target. No change in medications is indicated. We will continue with therapeutic lifestyle changes (TLC).  Obesity - I again mentioned the need for weight loss.

## 2013-01-17 NOTE — Telephone Encounter (Addendum)
Called patient and he informed nurse that he didn't need the samples he was wanting to let me know he was thankful for the last xarelto samples that were given. Xarelto samples were put back in sample drawer.

## 2013-02-11 ENCOUNTER — Other Ambulatory Visit: Payer: Self-pay | Admitting: Cardiology

## 2013-03-02 ENCOUNTER — Telehealth: Payer: Self-pay | Admitting: Cardiology

## 2013-03-09 NOTE — Telephone Encounter (Signed)
I am unable to write this.  The patient has controlled atrial fib.

## 2013-03-09 NOTE — Telephone Encounter (Signed)
Advised pt Dr. Antoine Poche statement that he is unable to write b/c pt has controlled A Fib. Pt verbalized understanding.

## 2013-03-26 ENCOUNTER — Other Ambulatory Visit: Payer: Self-pay | Admitting: Cardiology

## 2013-06-14 ENCOUNTER — Other Ambulatory Visit: Payer: Self-pay

## 2013-06-14 MED ORDER — RIVAROXABAN 20 MG PO TABS: 20.0000 mg | ORAL_TABLET | Freq: Every day | ORAL | Status: DC

## 2013-07-25 ENCOUNTER — Ambulatory Visit (INDEPENDENT_AMBULATORY_CARE_PROVIDER_SITE_OTHER): Payer: Medicare Other | Admitting: Cardiology

## 2013-07-25 ENCOUNTER — Encounter: Payer: Self-pay | Admitting: Cardiology

## 2013-07-25 VITALS — BP 136/81 | HR 81 | Ht 72.0 in | Wt 262.0 lb

## 2013-07-25 DIAGNOSIS — I4891 Unspecified atrial fibrillation: Secondary | ICD-10-CM

## 2013-07-25 NOTE — Patient Instructions (Signed)
The current medical regimen is effective;  continue present plan and medications.  Follow up in 1 year with Dr Hochrein.  You will receive a letter in the mail 2 months before you are due.  Please call us when you receive this letter to schedule your follow up appointment.  

## 2013-07-25 NOTE — Progress Notes (Signed)
    HPI The patient presents for evaluation of atrial fibrillation and he failed cardioversion . He does not feel palpitations. He has no presyncope or syncope.  He has some chronic dyspnea with exertion but this is unchanged. He's not having any PND or orthopnea. He has had no weight gain or edema. He has had no chest pressure, neck or arm discomfort.  His biggest complaint continues to be fatigue.    No Known Allergies  Current Outpatient Prescriptions  Medication Sig Dispense Refill  . allopurinol (ZYLOPRIM) 300 MG tablet Take 300 mg by mouth daily.      Marland Kitchen diltiazem (TIAZAC) 360 MG 24 hr capsule TAKE ONE CAPSULE BY MOUTH EVERY DAY  30 capsule  6  . flurazepam (DALMANE) 30 MG capsule Take 30 mg by mouth at bedtime.      . gabapentin (NEURONTIN) 300 MG capsule Take 300 mg by mouth 3 (three) times daily.      Marland Kitchen lisinopril (PRINIVIL,ZESTRIL) 40 MG tablet Take 40 mg by mouth 2 (two) times daily.      . Melatonin 5 MG CAPS Take 10 mg by mouth daily.      . metoprolol succinate (TOPROL-XL) 50 MG 24 hr tablet TAKE 1 TABLET BY MOUTH TWICE A DAY WITH FOOD  60 tablet  6  . Multiple Vitamin (MULTIVITAMIN) tablet Take 1 tablet by mouth daily.      . Rivaroxaban (XARELTO) 20 MG TABS tablet Take 1 tablet (20 mg total) by mouth daily.  30 tablet  3  . Specialty Vitamins Products (MAGNESIUM, AMINO ACID CHELATE,) 133 MG tablet Take 1 tablet by mouth daily.       No current facility-administered medications for this visit.    Past Medical History  Diagnosis Date  . Arthritis, degenerative   . Gout, unspecified   . Unspecified essential hypertension   . Calculus of kidney     Past Surgical History  Procedure Laterality Date  . None      ROS:  As stated in the HPI and negative for all other systems.  PHYSICAL EXAM BP 136/81  Pulse 81  Ht 6' (1.829 m)  Wt 262 lb (118.842 kg)  BMI 35.53 kg/m2 GENERAL:  Well appearing HEENT:  Pupils equal round and reactive, fundi not visualized, oral mucosa  unremarkable, upper dentures NECK:  No jugular venous distention, waveform within normal limits, carotid upstroke brisk and symmetric, no bruits, no thyromegaly LUNGS:  Clear to auscultation bilaterally CHEST:  Unremarkable HEART:  PMI not displaced or sustained,S1 and S2 within normal limits, no H3,ZJ clicks, no rubs, no murmurs, irregular ABD:  Flat, positive bowel sounds normal in frequency in pitch, no bruits, no rebound, no guarding, no midline pulsatile mass, no hepatomegaly, no splenomegaly, obese EXT:  2 plus pulses throughout, no edema, no cyanosis no clubbing  EKG:  Atrial fib with slow ventricular rate, 87 poor anterior R wave progression, no acute ST-T wave changes.  ASSESSMENT AND PLAN  Atrial fibrillation - The patient  tolerates this rhythm and rate control and anticoagulation. We will continue with the meds as listed.  HTN - The blood pressure is at target. No change in medications is indicated. We will continue with therapeutic lifestyle changes (TLC).  Obesity - I again mentioned the need for weight loss.  He did loose a little weight.

## 2013-09-10 ENCOUNTER — Other Ambulatory Visit: Payer: Self-pay

## 2013-09-10 MED ORDER — DILTIAZEM HCL ER BEADS 360 MG PO CP24: ORAL_CAPSULE | ORAL | Status: DC

## 2013-11-13 ENCOUNTER — Other Ambulatory Visit: Payer: Self-pay | Admitting: *Deleted

## 2013-11-13 MED ORDER — RIVAROXABAN 20 MG PO TABS: 20.0000 mg | ORAL_TABLET | Freq: Every day | ORAL | Status: DC

## 2013-11-21 ENCOUNTER — Other Ambulatory Visit: Payer: Self-pay

## 2013-11-21 MED ORDER — METOPROLOL SUCCINATE ER 50 MG PO TB24: ORAL_TABLET | ORAL | Status: DC

## 2014-06-21 ENCOUNTER — Other Ambulatory Visit: Payer: Self-pay | Admitting: *Deleted

## 2014-06-21 MED ORDER — RIVAROXABAN 20 MG PO TABS: 20.0000 mg | ORAL_TABLET | Freq: Every day | ORAL | Status: DC

## 2014-08-07 ENCOUNTER — Encounter: Payer: Self-pay | Admitting: Cardiology

## 2014-08-07 ENCOUNTER — Ambulatory Visit (INDEPENDENT_AMBULATORY_CARE_PROVIDER_SITE_OTHER): Payer: Medicare Other | Admitting: Cardiology

## 2014-08-07 VITALS — BP 128/72 | HR 80 | Ht 72.0 in | Wt 277.0 lb

## 2014-08-07 DIAGNOSIS — I4819 Other persistent atrial fibrillation: Secondary | ICD-10-CM

## 2014-08-07 DIAGNOSIS — I481 Persistent atrial fibrillation: Secondary | ICD-10-CM | POA: Diagnosis not present

## 2014-08-07 NOTE — Patient Instructions (Signed)
Medication Instructions:  Your physician recommends that you continue on your current medications as directed. Please refer to the Current Medication list given to you today.  Follow-Up: Follow up in 1 year with Dr. Percival Spanish.  You will receive a letter in the mail 2 months before you are due.  Please call us when you receive this letter to schedule your follow up appointment.   Thank you for choosing [facility]!!

## 2014-08-07 NOTE — Progress Notes (Signed)
    HPI The patient presents for evaluation of atrial fibrillation and he failed cardioversion . He does not feel palpitations. He has no presyncope or syncope.  He has some chronic dyspnea with exertion but this is unchanged. He's not having any PND or orthopnea. He has had no weight gain or edema. He has had no chest pressure, neck or arm discomfort.  His biggest complaint continues to be fatigue.  He still does some yard work.    No Known Allergies  Current Outpatient Prescriptions  Medication Sig Dispense Refill  . allopurinol (ZYLOPRIM) 300 MG tablet Take 300 mg by mouth daily.    Marland Kitchen diltiazem (TIAZAC) 360 MG 24 hr capsule TAKE ONE CAPSULE BY MOUTH EVERY DAY 30 capsule 11  . finasteride (PROSCAR) 5 MG tablet Take 5 mg by mouth daily.    . flurazepam (DALMANE) 30 MG capsule Take 30 mg by mouth at bedtime.    . gabapentin (NEURONTIN) 300 MG capsule Take 300 mg by mouth 3 (three) times daily.    Marland Kitchen lisinopril (PRINIVIL,ZESTRIL) 40 MG tablet Take 40 mg by mouth 2 (two) times daily.    . Melatonin 5 MG CAPS Take 10 mg by mouth daily.    . metoprolol succinate (TOPROL-XL) 50 MG 24 hr tablet TAKE 1 TABLET BY MOUTH TWICE A DAY WITH FOOD 60 tablet 6  . Multiple Vitamin (MULTIVITAMIN) tablet Take 1 tablet by mouth daily.    . rivaroxaban (XARELTO) 20 MG TABS tablet Take 1 tablet (20 mg total) by mouth daily. 30 tablet 6  . Specialty Vitamins Products (MAGNESIUM, AMINO ACID CHELATE,) 133 MG tablet Take 1 tablet by mouth daily.     No current facility-administered medications for this visit.    Past Medical History  Diagnosis Date  . Arthritis, degenerative   . Gout, unspecified   . Unspecified essential hypertension   . Calculus of kidney     Past Surgical History  Procedure Laterality Date  . None      ROS:  As stated in the HPI and negative for all other systems.  PHYSICAL EXAM BP 128/72 mmHg  Pulse 80  Ht 6' (1.829 m)  Wt 277 lb (125.646 kg)  BMI 37.56 kg/m2 GENERAL:  Well  appearing NECK:  No jugular venous distention, waveform within normal limits, carotid upstroke brisk and symmetric, no bruits, no thyromegaly LUNGS:  Clear to auscultation bilaterally CHEST:  Unremarkable HEART:  PMI not displaced or sustained,S1 and S2 within normal limits, no P3,AS clicks, no rubs, no murmurs, irregular ABD:  Flat, positive bowel sounds normal in frequency in pitch, no bruits, no rebound, no guarding, no midline pulsatile mass, no hepatomegaly, no splenomegaly, obese EXT:  2 plus pulses throughout, mild edema, no cyanosis no clubbing  EKG:  Atrial fib with ventricular rate 80, anterior R wave progression, no acute ST-T wave changes.  08/07/2014   ASSESSMENT AND PLAN  Atrial fibrillation - The patient  tolerates this rhythm and rate control and anticoagulation. We will continue with the meds as listed.    HTN - The blood pressure is at target. No change in medications is indicated. We will continue with therapeutic lifestyle changes (TLC).  Obesity - His weight is up and he understands the need to work on this.

## 2014-08-09 ENCOUNTER — Other Ambulatory Visit: Payer: Self-pay | Admitting: *Deleted

## 2014-08-09 MED ORDER — DILTIAZEM HCL ER BEADS 360 MG PO CP24: ORAL_CAPSULE | ORAL | Status: DC

## 2015-01-06 ENCOUNTER — Other Ambulatory Visit: Payer: Self-pay | Admitting: Cardiology

## 2015-03-05 ENCOUNTER — Other Ambulatory Visit: Payer: Self-pay

## 2015-03-05 MED ORDER — DILTIAZEM HCL ER BEADS 360 MG PO CP24: ORAL_CAPSULE | ORAL | Status: DC

## 2015-04-16 ENCOUNTER — Telehealth: Payer: Self-pay | Admitting: Cardiology

## 2015-04-16 NOTE — Telephone Encounter (Signed)
New message      Request for surgical clearance:  1. What type of surgery is being performed?  Kidney stone procedure  2. When is this surgery scheduled? Pending clearance  3. Are there any medications that need to be held prior to surgery and how long? Stop xarelto for 2 weeks  4. Name of physician performing surgery?  Dr Iona Beard Hurt---urologist  5. What is your office phone and fax number?  Fax [phone]

## 2015-04-17 NOTE — Telephone Encounter (Signed)
The patient has no contraindication to the procedure.  He can hold the Xarelto for 3 days prior to the procedure.  I am not sure why his urologist would want him to hold it for 2 weeks prior to the procedure.

## 2015-04-18 NOTE — Telephone Encounter (Signed)
Called Deanna back who requested instructions be faxed. Message routed.

## 2015-04-21 ENCOUNTER — Telehealth: Payer: Self-pay | Admitting: Cardiology

## 2015-04-21 NOTE — Telephone Encounter (Signed)
Will fax the telephone note from 04-16-15 to [phone]

## 2015-04-21 NOTE — Telephone Encounter (Addendum)
New message      Calling to talk about clearance to be off xarelto for 2 weeks.  They received a clearance but it is to be off xarelto for 3 day.

## 2015-04-28 ENCOUNTER — Telehealth: Payer: Self-pay | Admitting: Cardiology

## 2015-04-28 NOTE — Telephone Encounter (Signed)
Please call her,concerning Christopher Baird.

## 2015-04-28 NOTE — Telephone Encounter (Signed)
ROUTED TO DR HURT'S OFFICE  NOTIFIED [facility]

## 2015-04-28 NOTE — Telephone Encounter (Signed)
Spoke to Deanna ,  Dr Lerry Liner wanted to know if the patient can hold Xarelto at least 7 days after lithotripsy. Patient will have ultrasound afterwards,if patient has hemotoma,would like to hold Xarelto for 7 days. Would like clearance from Dr Curly Rim addition to holding XARELTO for 3 days prior to lithotripsy. Will defer to Dr Percival Spanish.

## 2015-04-28 NOTE — Telephone Encounter (Signed)
The Xarelto can be held for the duration determined necessary per the operating physician.  Resume as soon as the risk of bleeding is thought to be acceptable.

## 2015-04-29 ENCOUNTER — Telehealth: Payer: Self-pay | Admitting: Cardiology

## 2015-04-29 NOTE — Telephone Encounter (Signed)
Spoke w patient and discussed concerns - informed him message was routed yesterday and Tilda Burrow was informed of revised clearance note. Pt voiced understanding. Pt to follow up w/ urology.

## 2015-04-29 NOTE — Telephone Encounter (Addendum)
New message      Pt lives in [place].  He has a kidney stone.  Has his urologist contacted [place] to get clearance?  Please call pt and give him an update---he is bleeding and in pain.

## 2015-08-02 ENCOUNTER — Other Ambulatory Visit: Payer: Self-pay | Admitting: Cardiology

## 2015-09-09 NOTE — Progress Notes (Signed)
HPI The patient presents for evaluation of atrial fibrillation.  He failed cardioversion in the past.  Since I last saw him he held his Xarelto for a   urologic procedure.  He's been fatigue but this has been a somewhat chronic problem. It might be slightly worse. He's had a lot of problems with skin cancer. He otherwise is doing well. He still does some yard work. The patient denies any new symptoms such as chest discomfort, neck or arm discomfort. There has been no new shortness of breath, PND or orthopnea. There have been no reported palpitations, presyncope or syncope.  No Known Allergies  Current Outpatient Prescriptions  Medication Sig Dispense Refill  . allopurinol (ZYLOPRIM) 300 MG tablet Take 300 mg by mouth daily.    Marland Kitchen diltiazem (TIAZAC) 360 MG 24 hr capsule TAKE ONE CAPSULE BY MOUTH EVERY DAY 30 capsule 6  . finasteride (PROSCAR) 5 MG tablet Take 5 mg by mouth daily.    . flurazepam (DALMANE) 30 MG capsule Take 30 mg by mouth at bedtime.    . gabapentin (NEURONTIN) 300 MG capsule Take 300 mg by mouth 3 (three) times daily.    Marland Kitchen lisinopril (PRINIVIL,ZESTRIL) 40 MG tablet Take 40 mg by mouth daily.     . Melatonin 5 MG CAPS Take 10 mg by mouth daily.    . metFORMIN (GLUCOPHAGE) 500 MG tablet Take 500 mg by mouth daily.  3  . metoprolol succinate (TOPROL-XL) 50 MG 24 hr tablet TAKE 1 TABLET BY MOUTH TWICE A DAY WITH FOOD 60 tablet 6  . Multiple Vitamin (MULTIVITAMIN) tablet Take 1 tablet by mouth daily.    Marland Kitchen Specialty Vitamins Products (MAGNESIUM, AMINO ACID CHELATE,) 133 MG tablet Take 1 tablet by mouth daily.    Alveda Reasons 20 MG TABS tablet TAKE 1 TABLET BY MOUTH EVERY DAY 30 tablet 6   No current facility-administered medications for this visit.    Past Medical History  Diagnosis Date  . Arthritis, degenerative   . Gout, unspecified   . Unspecified essential hypertension   . Calculus of kidney     Past Surgical History  Procedure Laterality Date  . None      ROS:   As stated in the HPI and negative for all other systems.  PHYSICAL EXAM BP 112/70 mmHg  Pulse 66  Ht 6' (1.829 m)  Wt 257 lb (116.574 kg)  BMI 34.85 kg/m2 GENERAL:  Well appearing NECK:  No jugular venous distention, waveform within normal limits, carotid upstroke brisk and symmetric, no bruits, no thyromegaly LUNGS:  Clear to auscultation bilaterally CHEST:  Unremarkable HEART:  PMI not displaced or sustained,S1 and S2 within normal limits, no Q6,VH clicks, no rubs, no murmurs, irregular ABD:  Flat, positive bowel sounds normal in frequency in pitch, no bruits, no rebound, no guarding, no midline pulsatile mass, no hepatomegaly, no splenomegaly, obese EXT:  2 plus pulses throughout, mild edema, no cyanosis no clubbing  EKG:  Atrial fib with ventricular rate 66, anterior R wave progression, no acute ST-T wave changes.  09/10/2015   ASSESSMENT AND PLAN  Atrial fibrillation - He is fatigue and his rate seems slower. Reduce his Cardizem to 240 mg daily.  Mr. Kayden Hutmacher has a CHA2DS2 - VASc score of 3 with a risk of stroke of 3.2%.  He tolerates anticoagulation.  HTN - The blood pressure is at target. No change in medications is indicated. We will continue with therapeutic lifestyle changes (TLC).  Obesity - His  weight is up and he understands the need to work on this.

## 2015-09-10 ENCOUNTER — Ambulatory Visit (INDEPENDENT_AMBULATORY_CARE_PROVIDER_SITE_OTHER): Payer: Medicare Other | Admitting: Cardiology

## 2015-09-10 ENCOUNTER — Encounter: Payer: Self-pay | Admitting: Cardiology

## 2015-09-10 VITALS — BP 112/70 | HR 66 | Ht 72.0 in | Wt 257.0 lb

## 2015-09-10 DIAGNOSIS — I481 Persistent atrial fibrillation: Secondary | ICD-10-CM

## 2015-09-10 DIAGNOSIS — I480 Paroxysmal atrial fibrillation: Secondary | ICD-10-CM

## 2015-09-10 DIAGNOSIS — I4819 Other persistent atrial fibrillation: Secondary | ICD-10-CM

## 2015-09-10 MED ORDER — DILTIAZEM HCL ER BEADS 240 MG PO CP24: ORAL_CAPSULE | ORAL | Status: DC

## 2015-09-10 NOTE — Patient Instructions (Signed)
Medication Instructions:  Please decrease your Cardizem to 240 mg a day. Continue all other medications as listed.  Follow-Up: Follow up in 1 year with Dr. Percival Spanish in [place].  You will receive a letter in the mail 2 months before you are due.  Please call us when you receive this letter to schedule your follow up appointment.  If you need a refill on your cardiac medications before your next appointment, please call your pharmacy.  Thank you for choosing [facility]!!

## 2016-02-24 ENCOUNTER — Other Ambulatory Visit: Payer: Self-pay | Admitting: Cardiology

## 2016-08-01 ENCOUNTER — Other Ambulatory Visit: Payer: Self-pay | Admitting: Cardiology

## 2016-08-02 NOTE — Telephone Encounter (Signed)
Needs CBC & BMP

## 2016-08-02 NOTE — Telephone Encounter (Signed)
Blood work to be requested from PCP (talked to patient's wife)  Rx for 30day supply sent to pharmacy.   No more refills until recent BMP & CBC obtained

## 2016-09-11 ENCOUNTER — Other Ambulatory Visit: Payer: Self-pay | Admitting: Cardiology

## 2016-09-22 ENCOUNTER — Other Ambulatory Visit: Payer: Self-pay | Admitting: Cardiology

## 2016-09-22 MED ORDER — DILTIAZEM HCL ER COATED BEADS 240 MG PO CP24: 240.0000 mg | ORAL_CAPSULE | Freq: Every day | ORAL | 0 refills | Status: DC

## 2016-09-22 MED ORDER — RIVAROXABAN 20 MG PO TABS: 20.0000 mg | ORAL_TABLET | Freq: Every day | ORAL | 0 refills | Status: DC

## 2016-09-22 NOTE — Telephone Encounter (Signed)
LMTCB

## 2016-09-22 NOTE — Telephone Encounter (Signed)
Rx's sent to [facility] per patient request; patient directly notified.

## 2016-09-22 NOTE — Telephone Encounter (Signed)
New Message   *STAT* If patient is at the pharmacy, call can be transferred to refill team.   1. Which medications need to be refilled? (please list name of each medication and dose if known) Diltiazem 240mg ....Marland KitchenMarland KitchenMarland Kitchen Xarelto 20mg   2. Which pharmacy/location (including street and city if local pharmacy) is medication to be sent to? [facility] [place]  3. Do they need a 30 day or 90 day supply? [place]

## 2016-10-11 ENCOUNTER — Other Ambulatory Visit: Payer: Self-pay | Admitting: Cardiology

## 2016-10-12 ENCOUNTER — Other Ambulatory Visit: Payer: Self-pay | Admitting: Cardiology

## 2016-10-20 ENCOUNTER — Other Ambulatory Visit: Payer: Self-pay | Admitting: Cardiology

## 2016-10-20 NOTE — Telephone Encounter (Signed)
Christopher Baird, [place]

## 2016-10-25 ENCOUNTER — Encounter: Payer: Self-pay | Admitting: Cardiology

## 2016-11-09 NOTE — Progress Notes (Signed)
HPI The patient presents for evaluation of atrial fibrillation.  He failed cardioversion in the past.  Since I last saw him he continues to have problems with skin cancer.  However, he has had no cardiovascular complaints.  He is not very active.  His wife, he reports, has early dementia.  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  The patient denies any new symptoms such as chest discomfort, neck or arm discomfort. There has been no new shortness of breath, PND or orthopnea. There have been no reported palpitations, presyncope or syncope.   No Known Allergies  Current Outpatient Prescriptions  Medication Sig Dispense Refill  . allopurinol (ZYLOPRIM) 300 MG tablet Take 300 mg by mouth daily.    Marland Kitchen atorvastatin (LIPITOR) 10 MG tablet Take 10 mg by mouth daily.    Marland Kitchen diltiazem (CARDIZEM CD) 240 MG 24 hr capsule Take 1 capsule (240 mg total) by mouth daily. 30 capsule 11  . finasteride (PROSCAR) 5 MG tablet Take 5 mg by mouth daily.    . flurazepam (DALMANE) 30 MG capsule Take 30 mg by mouth at bedtime.    . gabapentin (NEURONTIN) 300 MG capsule Take 300 mg by mouth 3 (three) times daily.    Marland Kitchen lisinopril (PRINIVIL,ZESTRIL) 40 MG tablet Take 40 mg by mouth daily.     . metFORMIN (GLUCOPHAGE) 500 MG tablet Take 500 mg by mouth daily.  3  . metoprolol succinate (TOPROL-XL) 50 MG 24 hr tablet TAKE 1 TABLET BY MOUTH TWICE A DAY WITH FOOD 60 tablet 6  . Multiple Vitamin (MULTIVITAMIN) tablet Take 1 tablet by mouth daily.    . rivaroxaban (XARELTO) 20 MG TABS tablet Take 1 tablet (20 mg total) by mouth daily with supper. 30 tablet 11  . Specialty Vitamins Products (MAGNESIUM, AMINO ACID CHELATE,) 133 MG tablet Take 1 tablet by mouth daily.    . Melatonin 5 MG CAPS Take 10 mg by mouth daily.     No current facility-administered medications for this visit.     Past Medical History:  Diagnosis Date  . Arthritis, degenerative   . Calculus of kidney   . Gout, unspecified   . Unspecified essential hypertension      Past Surgical History:  Procedure Laterality Date  . None      ROS:  As stated in the HPI and negative for all other systems.  PHYSICAL EXAM BP 128/88   Pulse 78   Ht 6' (1.829 m)   Wt 263 lb (119.3 kg)   BMI 35.67 kg/m   GENERAL:  Well appearing NECK:  No jugular venous distention, waveform within normal limits, carotid upstroke brisk and symmetric, no bruits, no thyromegaly LUNGS:  Clear to auscultation bilaterally CHEST:  Unremarkable HEART:  PMI not displaced or sustained,S1 and S2 within normal limits, no S3,  no clicks, no rubs, no murmurs, irregular ABD:  Flat, positive bowel sounds normal in frequency in pitch, no bruits, no rebound, no guarding, no midline pulsatile mass, no hepatomegaly, no splenomegaly EXT:  2 plus pulses throughout, no edema, no cyanosis no clubbing   EKG:  Atrial fib with ventricular rate 77, anterior R wave progression, no acute ST-T wave changes.  Poor anterior R wave progression  11/10/2016   ASSESSMENT AND PLAN  Atrial fibrillation -  Mr. Yesenia Fontenette has a CHA2DS2 - VASc score of 3 with a risk of stroke of 3.2%.  The patient  tolerates this rhythm and rate control and anticoagulation. We will continue with the meds  as listed.  He should not be on ASA.    HTN - The blood pressure is at target.  No change in therapy.   Obesity - His weight is down from the peak but up from his lowest.  We have had multiple discussions about this.

## 2016-11-10 ENCOUNTER — Encounter: Payer: Self-pay | Admitting: Cardiology

## 2016-11-10 ENCOUNTER — Ambulatory Visit (INDEPENDENT_AMBULATORY_CARE_PROVIDER_SITE_OTHER): Payer: Medicare Other | Admitting: Cardiology

## 2016-11-10 VITALS — BP 128/88 | HR 78 | Ht 72.0 in | Wt 263.0 lb

## 2016-11-10 DIAGNOSIS — I482 Chronic atrial fibrillation: Secondary | ICD-10-CM | POA: Diagnosis not present

## 2016-11-10 DIAGNOSIS — I4821 Permanent atrial fibrillation: Secondary | ICD-10-CM

## 2016-11-10 DIAGNOSIS — I10 Essential (primary) hypertension: Secondary | ICD-10-CM | POA: Diagnosis not present

## 2016-11-10 MED ORDER — RIVAROXABAN 20 MG PO TABS: 20.0000 mg | ORAL_TABLET | Freq: Every day | ORAL | 11 refills | Status: DC

## 2016-11-10 MED ORDER — DILTIAZEM HCL ER COATED BEADS 240 MG PO CP24: 240.0000 mg | ORAL_CAPSULE | Freq: Every day | ORAL | 11 refills | Status: DC

## 2016-11-10 NOTE — Patient Instructions (Signed)
Medication Instructions:  Please stop your aspirin.  Continue all other medications as listed.  Follow-Up: Follow up in 1 year with Dr. Warren Lacy.  You will receive a letter in the mail 2 months before you are due.  Please call us when you receive this letter to schedule your follow up appointment.  If you need a refill on your cardiac medications before your next appointment, please call your pharmacy.  Thank you for choosing [facility]!!

## 2016-12-15 ENCOUNTER — Other Ambulatory Visit: Payer: Self-pay | Admitting: *Deleted

## 2016-12-15 MED ORDER — RIVAROXABAN 20 MG PO TABS: 20.0000 mg | ORAL_TABLET | Freq: Every day | ORAL | 3 refills | Status: DC

## 2016-12-15 NOTE — Telephone Encounter (Signed)
Pharmacy requests ninety day rx.

## 2016-12-15 NOTE — Telephone Encounter (Signed)
Request for Xarelto 20mg  received; pt is 70 yrs, Wt-119.3kg, Crea-1.29 on 10/07/16 via scanned labs in the chart, Last seen by Dr. Percival Spanish on 11/10/16, CrCl-87.58ml/min. Will send in refill request.

## 2016-12-20 ENCOUNTER — Other Ambulatory Visit: Payer: Self-pay | Admitting: Pharmacist Clinician (PhC)/ Clinical Pharmacy Specialist

## 2016-12-20 MED ORDER — RIVAROXABAN 20 MG PO TABS: 20.0000 mg | ORAL_TABLET | Freq: Every day | ORAL | 1 refills | Status: DC

## 2017-11-10 ENCOUNTER — Encounter (HOSPITAL_COMMUNITY): Payer: Self-pay | Admitting: Hematology

## 2017-11-10 ENCOUNTER — Inpatient Hospital Stay (HOSPITAL_COMMUNITY): Payer: Medicare Other

## 2017-11-10 ENCOUNTER — Other Ambulatory Visit: Payer: Self-pay

## 2017-11-10 ENCOUNTER — Inpatient Hospital Stay (HOSPITAL_COMMUNITY): Payer: Medicare Other | Attending: Hematology | Admitting: Hematology

## 2017-11-10 VITALS — BP 126/70 | HR 101 | Temp 97.8°F | Resp 20 | Ht 72.0 in | Wt 258.0 lb

## 2017-11-10 DIAGNOSIS — C439 Malignant melanoma of skin, unspecified: Secondary | ICD-10-CM

## 2017-11-10 DIAGNOSIS — C7801 Secondary malignant neoplasm of right lung: Secondary | ICD-10-CM | POA: Diagnosis not present

## 2017-11-10 DIAGNOSIS — Z85828 Personal history of other malignant neoplasm of skin: Secondary | ICD-10-CM

## 2017-11-10 DIAGNOSIS — Z803 Family history of malignant neoplasm of breast: Secondary | ICD-10-CM

## 2017-11-10 DIAGNOSIS — Z7901 Long term (current) use of anticoagulants: Secondary | ICD-10-CM | POA: Diagnosis not present

## 2017-11-10 DIAGNOSIS — I4891 Unspecified atrial fibrillation: Secondary | ICD-10-CM | POA: Insufficient documentation

## 2017-11-10 DIAGNOSIS — Z79899 Other long term (current) drug therapy: Secondary | ICD-10-CM | POA: Insufficient documentation

## 2017-11-10 DIAGNOSIS — Z800 Family history of malignant neoplasm of digestive organs: Secondary | ICD-10-CM | POA: Diagnosis not present

## 2017-11-10 DIAGNOSIS — E119 Type 2 diabetes mellitus without complications: Secondary | ICD-10-CM | POA: Diagnosis not present

## 2017-11-10 DIAGNOSIS — Z7984 Long term (current) use of oral hypoglycemic drugs: Secondary | ICD-10-CM | POA: Diagnosis not present

## 2017-11-10 DIAGNOSIS — C434 Malignant melanoma of scalp and neck: Secondary | ICD-10-CM | POA: Diagnosis present

## 2017-11-10 LAB — COMPREHENSIVE METABOLIC PANEL
ALBUMIN: 3.4 g/dL — AB (ref 3.5–5.0)
ALT: 14 U/L (ref 0–44)
AST: 21 U/L (ref 15–41)
Alkaline Phosphatase: 93 U/L (ref 38–126)
Anion gap: 12 (ref 5–15)
BILIRUBIN TOTAL: 1.2 mg/dL (ref 0.3–1.2)
BUN: 10 mg/dL (ref 8–23)
CO2: 25 mmol/L (ref 22–32)
CREATININE: 1.14 mg/dL (ref 0.61–1.24)
Calcium: 8.8 mg/dL — ABNORMAL LOW (ref 8.9–10.3)
Chloride: 103 mmol/L (ref 98–111)
GFR calc Af Amer: >60 mL/min (ref >60)
Glucose, Bld: 138 mg/dL — ABNORMAL HIGH (ref 70–99)
POTASSIUM: 3.1 mmol/L — AB (ref 3.5–5.1)
Sodium: 140 mmol/L (ref 135–145)
TOTAL PROTEIN: 7.2 g/dL (ref 6.5–8.1)

## 2017-11-10 LAB — CBC WITH DIFFERENTIAL/PLATELET
BASOS ABS: 0 10*3/uL (ref 0.0–0.1)
BASOS PCT: 0 %
Eosinophils Absolute: 0.1 10*3/uL (ref 0.0–0.7)
Eosinophils Relative: 1 %
HEMATOCRIT: 38.7 % — AB (ref 39.0–52.0)
HEMOGLOBIN: 12.7 g/dL — AB (ref 13.0–17.0)
LYMPHS PCT: 25 %
Lymphs Abs: 2.3 10*3/uL (ref 0.7–4.0)
MCH: 28.3 pg (ref 26.0–34.0)
MCHC: 32.8 g/dL (ref 30.0–36.0)
MCV: 86.4 fL (ref 78.0–100.0)
MONO ABS: 0.9 10*3/uL (ref 0.1–1.0)
MONOS PCT: 9 %
NEUTROS PCT: 65 %
Neutro Abs: 6.1 10*3/uL (ref 1.7–7.7)
Platelets: 290 10*3/uL (ref 150–400)
RBC: 4.48 MIL/uL (ref 4.22–5.81)
RDW: 15.2 % (ref 11.5–15.5)
WBC: 9.3 10*3/uL (ref 4.0–10.5)

## 2017-11-10 LAB — LACTATE DEHYDROGENASE: LDH: 187 U/L (ref 98–192)

## 2017-11-10 NOTE — Assessment & Plan Note (Addendum)
1.  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  Underwent modified most, diagnosed as a collision tumor (melanoma and carcinoma), likely metastatic.  Lymph nodes were clinically negative. -PET CT scan on 07/13/2017 shows hypermetabolic right lower lobe pulmonary nodule concerning for metastasis.  Additional pulmonary nodules of the right lung which are below resolution of the PET CT.  Focal highly hypermetabolic lesion of the descending colon, status post colonoscopy on 09/21/2017-tubulovillous, tubular and adenomatous polyps. - Underwent CT-guided right lower lobe lung biopsy on 10/05/2017-consistent with malignant melanoma. -Notes from Dr. Baltazar Najjar indicate BRAF V600 mutation was negative.  We will obtain reports of it. - We discussed about the normal prognosis of metastatic malignant melanoma.  We talked about treatment options including single agent ipilimumab/pembrolizumab with the overall objective response rate of 40 to 45% and 4-year overall survival of 42%.  We also discussed response rates of combination immunotherapy with ipilimumab (3 mg/kg) and nivolumab (1 mg/kg) every 3 weeks for 4 doses followed by maintenance nivolumab to be around 60% with 3-year overall survival approaching 70%.  Overall survival in 2 months without a BRAF mutation is slightly lower. - Because of his good performance status, I think he will be a good candidate for combination immunotherapy.  However I will make final recommendations after I see him back in the next few weeks.  I have recommended a whole-body PET CT scan for restaging.  We will also obtain MRI of the brain with and without contrast.  We  will check an LDH level.  2.  Atrial fibrillation: -He is on Xarelto.

## 2017-11-10 NOTE — Progress Notes (Signed)
[facility] NOTE  Patient Care Team: Tillman Abide, FNP as PCP - General (Family Medicine)  CHIEF COMPLAINTS/PURPOSE OF CONSULTATION:  Malignant melanoma metastatic to the lung.  HISTORY OF PRESENTING ILLNESS:  Christopher Baird 72 y.o. male is seen in consultation today for further work-up and management of metastatic malignant melanoma to the lung.  He had a melanoma in the scalp vertex resected in November 2017 by Mohs surgery.  Uncertain staging as I do not have access to the pathology.  He also had many nonmelanoma skin cancers.  He developed a lesion on the left frontal scalp, shave biopsy showed poorly differentiated carcinoma with clear cell change and dermal fibrosis.  He underwent modified Mohs on May 25, 2017, diagnosed as a collision tumor (melanoma and carcinoma), likely metastatic.  Lymph nodes were clinically negative.  A PET CT scan on 07/13/2017 showed hypermetabolic right lower lobe pulmonary nodule concerning for metastasis.  Additional pulmonary nodules of the right lung which are below the resolution of PET CT scan however, demonstrate some metabolic activity perceptible by PET, making them suspicious for additional metastatic lesions.  There was also focal highly hypermetabolic lesion of the descending colon without CT correlate noted.  He underwent a colonoscopy on 09/21/2017 which showed tubulovillous, and adenomatous polyps. On October 05, 2017, he underwent a CT-guided biopsy of the right lower lobe lung lesion, FNA and cytology core biopsy were consistent with malignant melanoma.  He met with Dr[facility]Triozzi at [facility] who has recommended single agent immunotherapy with nivolumab.  His BRAFmutation was wild type.  He is accompanied by his wife today.  He lives in [place].  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  He is independent of ADLs and IADLs.  Only other family member  with malignancy was sister with breast cancer.  He is on Xarelto for atrial fibrillation.  Also has diabetes mellitus.  MEDICAL HISTORY:  Past Medical History:  Diagnosis Date  . Arthritis, degenerative   . Calculus of kidney   . Gout, unspecified   . Skin cancer   . Unspecified essential hypertension     SURGICAL HISTORY: Past Surgical History:  Procedure Laterality Date  . None      SOCIAL HISTORY: Social History   Socioeconomic History  . Marital status: Married    Spouse name: Not on file  . Number of children: 1  . Years of education: Not on file  . Highest education level: Not on file  Occupational History  . Occupation: Retired    Comment: Chief Financial Officer  Social Needs  . Financial resource strain: Not hard at all  . Food insecurity:    Worry: Never true    Inability: Never true  . Transportation needs:    Medical: No    Non-medical: No  Tobacco Use  . Smoking status: Never Smoker  . Smokeless tobacco: Never Used  Substance and Sexual Activity  . Alcohol use: No  . Drug use: No  . Sexual activity: Not on file  Lifestyle  . Physical activity:    Days per week: 0 days    Minutes per session: 0 min  . Stress: To some extent  Relationships  . Social connections:    Talks on phone: More than three times a week    Gets together: Once a week    Attends religious service: 1 to 4 times per year  Active member of club or organization: No    Attends meetings of clubs or organizations: Never    Relationship status: Married  . Intimate partner violence:    Fear of current or ex partner: No    Emotionally abused: No    Physically abused: No    Forced sexual activity: No  Other Topics Concern  . Not on file  Social History Narrative  . Not on file    FAMILY HISTORY: Family History  Problem Relation Age of Onset  . Heart disease Mother   . Colon cancer Mother   . Stroke Mother   . Alcohol abuse Father        Cause of death is Suicide   . Breast cancer Sister   . Mental illness Paternal Aunt   . Alcohol abuse Paternal Uncle     ALLERGIES:  has No Known Allergies.  MEDICATIONS:  Current Outpatient Medications  Medication Sig Dispense Refill  . allopurinol (ZYLOPRIM) 300 MG tablet Take 300 mg by mouth daily.    Marland Kitchen atorvastatin (LIPITOR) 10 MG tablet Take 10 mg by mouth daily.    Marland Kitchen diltiazem (CARDIZEM CD) 240 MG 24 hr capsule Take 1 capsule (240 mg total) by mouth daily. 30 capsule 11  . flurazepam (DALMANE) 30 MG capsule Take 30 mg by mouth at bedtime.    . gabapentin (NEURONTIN) 300 MG capsule Take 300 mg by mouth 3 (three) times daily.    Marland Kitchen lisinopril (PRINIVIL,ZESTRIL) 40 MG tablet Take 40 mg by mouth daily.     . metFORMIN (GLUCOPHAGE) 500 MG tablet Take 500 mg by mouth daily.  3  . metoprolol succinate (TOPROL-XL) 50 MG 24 hr tablet TAKE 1 TABLET BY MOUTH TWICE A DAY WITH FOOD 60 tablet 6  . Multiple Vitamin (MULTIVITAMIN) tablet Take 1 tablet by mouth daily.    . rivaroxaban (XARELTO) 20 MG TABS tablet Take 1 tablet (20 mg total) by mouth daily with supper. 90 tablet 1  . Melatonin 5 MG CAPS Take 10 mg by mouth daily.     No current facility-administered medications for this visit.     REVIEW OF SYSTEMS:   Constitutional: Denies fevers, chills or abnormal night sweats Eyes: Denies blurriness of vision, double vision or watery eyes Ears, nose, mouth, throat, and face: Denies mucositis or sore throat Respiratory: Denies cough, dyspnea or wheezes Cardiovascular: Denies palpitation, chest discomfort or lower extremity swelling Gastrointestinal:  Denies nausea, heartburn or change in bowel habits Skin: Denies abnormal skin rashes Lymphatics: Denies new lymphadenopathy or easy bruising Neurological:Denies numbness, tingling or new weaknesses Behavioral/Psych: Mood is stable, no new changes  All other systems were reviewed with the patient and are negative.  PHYSICAL EXAMINATION: ECOG PERFORMANCE STATUS: 1 -  Symptomatic but completely ambulatory  Vitals:   11/10/17 0941  BP: 126/70  Pulse: (!) 101  Resp: 20  Temp: 97.8 F (36.6 C)  SpO2: 96%   Filed Weights   11/10/17 0941  Weight: 258 lb (117 kg)    GENERAL:alert, no distress and comfortable SKIN: Skin of the scalp was consistent with previous surgeries which are well-healed. EYES: normal, conjunctiva are pink and non-injected, sclera clear OROPHARYNX:no exudate, no erythema and lips, buccal mucosa, and tongue normal  NECK: supple, thyroid normal size, non-tender, without nodularity LYMPH:  no palpable lymphadenopathy in the cervical, axillary or inguinal LUNGS: clear to auscultation and percussion with normal breathing effort HEART: regular rate & rhythm and no murmurs and no lower extremity edema ABDOMEN:abdomen soft,  non-tender and normal bowel sounds Musculoskeletal:no cyanosis of digits and no clubbing  PSYCH: alert & oriented x 3 with fluent speech NEURO: no focal motor/sensory deficits  LABORATORY DATA:  I have reviewed the data as listed Lab Results  Component Value Date   WBC 9.3 11/10/2017   HGB 12.7 (L) 11/10/2017   HCT 38.7 (L) 11/10/2017   MCV 86.4 11/10/2017   PLT 290 11/10/2017     Chemistry      Component Value Date/Time   NA 140 11/10/2017 1022   K 3.1 (L) 11/10/2017 1022   CL 103 11/10/2017 1022   CO2 25 11/10/2017 1022   BUN 10 11/10/2017 1022   CREATININE 1.14 11/10/2017 1022      Component Value Date/Time   CALCIUM 8.8 (L) 11/10/2017 1022   ALKPHOS 93 11/10/2017 1022   AST 21 11/10/2017 1022   ALT 14 11/10/2017 1022   BILITOT 1.2 11/10/2017 1022       RADIOGRAPHIC STUDIES: I have independently reviewed the PET CT scan from 07/13/2017 done at [facility]:  Metastatic melanoma to lung, right ([place]) 1.  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  Underwent modified most, diagnosed as a collision tumor (melanoma and carcinoma), likely metastatic.  Lymph nodes were clinically negative. -PET CT scan on 07/13/2017 shows hypermetabolic right lower lobe pulmonary nodule concerning for metastasis.  Additional pulmonary nodules of the right lung which are below resolution of the PET CT.  Focal highly hypermetabolic lesion of the descending colon, status post colonoscopy on 09/21/2017-tubulovillous, tubular and adenomatous polyps. - Underwent CT-guided right lower lobe lung biopsy on 10/05/2017-consistent with malignant melanoma. -Notes from Dr. Baltazar Najjar indicate BRAF V600 mutation was negative.  We will obtain reports of it. - We discussed about the normal prognosis of metastatic malignant melanoma.  We talked about treatment options including single agent ipilimumab/pembrolizumab with the overall objective response rate of 40 to 45% and 4-year overall survival of 42%.  We also discussed response rates of combination immunotherapy with ipilimumab (3 mg/kg) and nivolumab (1 mg/kg) every 3 weeks for 4 doses followed by maintenance nivolumab to be around 60% with 3-year overall survival approaching 70%.  Overall survival in 2 months without a BRAF mutation is slightly lower. - Because of his good performance status, I think he will be a good candidate for combination immunotherapy.  However I will make final recommendations after I see him back in the next few weeks.  I have recommended a whole-body PET CT scan for restaging.  We will also obtain MRI of the brain with and without contrast.  We will check an LDH level.  2.  Atrial fibrillation: -He is on Xarelto.  Orders Placed This Encounter  Procedures  . NM PET Image Restag (PS) Skull Base To Thigh     Standing Status:   Future    Standing Expiration Date:   11/10/2018    Order Specific Question:   ** REASON FOR EXAM (FREE TEXT)    Answer:   melenoma    Order Specific Question:   If indicated for the ordered procedure, I authorize the administration of a radiopharmaceutical per Radiology protocol    Answer:  Yes    Order Specific Question:   Preferred imaging location?    Answer:   [facility]    Order Specific Question:   Radiology Contrast Protocol - do NOT remove file path    Answer:   [email or web address]  . MR Brain W Wo Contrast    Standing Status:   Future    Standing Expiration Date:   11/10/2018    Order Specific Question:   ** REASON FOR EXAM (FREE TEXT)    Answer:   melenoma    Order Specific Question:   If indicated for the ordered procedure, I authorize the administration of contrast media per Radiology protocol    Answer:   Yes    Order Specific Question:   What is the patient's sedation requirement?    Answer:   No Sedation    Order Specific Question:   Does the patient have a pacemaker or implanted devices?    Answer:   No    Order Specific Question:   Use SRS Protocol?    Answer:   Yes    Order Specific Question:   Radiology Contrast Protocol - do NOT remove file path    Answer:   [email or web address]    Order Specific Question:   Preferred imaging location?    Answer:   [facility] (table limit-350lbs)  . CBC with Differential/Platelet  . Comprehensive metabolic panel  . Lactate dehydrogenase    Standing Status:   Future    Number of Occurrences:   1    Standing Expiration Date:   11/10/2018    All questions were answered. The patient knows to call the clinic with any problems, questions or concerns.     Derek Jack, MD 11/10/2017 6:11 PM

## 2017-11-10 NOTE — Patient Instructions (Signed)
[place] [facility] Discharge Instructions  Follow up after your scans for results and your care plan.    Thank you for choosing [place] at [facility] to provide your oncology and hematology care.  To afford each patient quality time with our provider, please arrive at least 15 minutes before your scheduled appointment time.   If you have a lab appointment with the [facility] please come in thru the  Main Entrance and check in at the main information desk  You need to re-schedule your appointment should you arrive 10 or more minutes late.  We strive to give you quality time with our providers, and arriving late affects you and other patients whose appointments are after yours.  Also, if you no show three or more times for appointments you may be dismissed from the clinic at the providers discretion.     Again, thank you for choosing [facility].  Our hope is that these requests will decrease the amount of time that you wait before being seen by our physicians.       _____________________________________________________________  Should you have questions after your visit to [facility], please contact our office at [phone] between the hours of 8:00 a.m. and 4:30 p.m.  Voicemails left after 4:00 p.m. will not be returned until the following business day.  For prescription refill requests, have your pharmacy contact our office and allow 72 hours.    Cancer Center Support Programs:   > Cancer Support Group  2nd Tuesday of the month 1pm-2pm, Journey Room

## 2017-11-18 ENCOUNTER — Ambulatory Visit (HOSPITAL_COMMUNITY)
Admission: RE | Admit: 2017-11-18 | Discharge: 2017-11-18 | Disposition: A | Discharge status: Home / Self Care | Payer: Medicare Other | Source: Ambulatory Visit | Attending: Nurse Practitioner | Admitting: Nurse Practitioner

## 2017-11-18 DIAGNOSIS — C439 Malignant melanoma of skin, unspecified: Secondary | ICD-10-CM | POA: Insufficient documentation

## 2017-11-18 DIAGNOSIS — C7931 Secondary malignant neoplasm of brain: Secondary | ICD-10-CM | POA: Insufficient documentation

## 2017-11-18 MED ORDER — GADOBENATE DIMEGLUMINE 529 MG/ML IV SOLN
20.0000 mL | Freq: Once | INTRAVENOUS | Status: AC | PRN
  Administered 2017-11-18: 20 mL via INTRAVENOUS

## 2017-11-21 NOTE — Progress Notes (Signed)
HPI The patient presents for evaluation of atrial fibrillation.  He failed cardioversion in the past.  Since I last saw him he has had no acute cardiovascular problems.  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  He is going to have immunotherapy of this and there is a possibility of resection.  He has had biopsy in refill he came off his anticoagulation for this.  He does not notice his fibrillation.  He is not really having any palpitations, presyncope or syncope.  He has some chronic dyspnea with exertion but this is unchanged from previous.  He denies any PND or orthopnea.  No Known Allergies  Current Outpatient Medications  Medication Sig Dispense Refill  . allopurinol (ZYLOPRIM) 300 MG tablet Take 300 mg by mouth daily.    Marland Kitchen atorvastatin (LIPITOR) 10 MG tablet Take 10 mg by mouth daily.    Marland Kitchen diltiazem (CARDIZEM CD) 240 MG 24 hr capsule Take 1 capsule (240 mg total) by mouth daily. 30 capsule 11  . flurazepam (DALMANE) 30 MG capsule Take 30 mg by mouth at bedtime.    . gabapentin (NEURONTIN) 300 MG capsule Take 300 mg by mouth 3 (three) times daily.    Marland Kitchen lisinopril (PRINIVIL,ZESTRIL) 40 MG tablet Take 40 mg by mouth daily.     . metFORMIN (GLUCOPHAGE) 500 MG tablet Take 500 mg by mouth daily.  3  . metoprolol succinate (TOPROL-XL) 50 MG 24 hr tablet TAKE 1 TABLET BY MOUTH TWICE A DAY WITH FOOD 60 tablet 6  . Multiple Vitamin (MULTIVITAMIN) tablet Take 1 tablet by mouth daily.    . rivaroxaban (XARELTO) 20 MG TABS tablet Take 1 tablet (20 mg total) by mouth daily with supper. 90 tablet 1   No current facility-administered medications for this visit.     Past Medical History:  Diagnosis Date  . Arthritis, degenerative   . Calculus of kidney   . Gout, unspecified   . Skin cancer   . Unspecified essential hypertension     Past Surgical History:  Procedure Laterality Date  . None      ROS:    As stated in the HPI and negative for all other  systems.  PHYSICAL EXAM BP 112/70   Pulse 80   Ht 6' (1.829 m)   Wt 256 lb (116.1 kg)   BMI 34.72 kg/m   GENERAL:  Well appearing NECK:  No jugular venous distention, waveform within normal limits, carotid upstroke brisk and symmetric, no bruits, no thyromegaly LUNGS:  Clear to auscultation bilaterally CHEST:  Unremarkable HEART:  PMI not displaced or sustained,S1 and S2 within normal limits, no S3,, no clicks, no rubs, no murmurs, irregular ABD:  Flat, positive bowel sounds normal in frequency in pitch, no bruits, no rebound, no guarding, no midline pulsatile mass, no hepatomegaly, no splenomegaly EXT:  2 plus pulses throughout, no edema, no cyanosis no clubbing   EKG:  Atrial fib with ventricular rate 80, anterior R wave progression, no acute ST-T wave changes.  Poor anterior R wave progression  11/23/2017   ASSESSMENT AND PLAN  Atrial fibrillation -  [name] has a CHA2DS2 - VASc score of 3 with a risk of stroke of 3.2%.  I did discuss with him that his blood pressure is well controlled but if it drops or if he is a lower heart rate particularly as he goes through immunotherapy and might lose weight we could reduce his dose of Cardizem.  He may be certainly safe to hold his Xarelto as needed in the future for invasive procedures.  Would be happy to discuss at any time.  HTN - This is managed in the context of treating his fibrillation and rate control.  No change in therapy and less as indicated as above.  Obesity - His weight is an issue but not the biggest problem at this point.

## 2017-11-22 ENCOUNTER — Ambulatory Visit (HOSPITAL_COMMUNITY)
Admission: RE | Admit: 2017-11-22 | Discharge: 2017-11-22 | Disposition: A | Discharge status: Home / Self Care | Payer: Medicare Other | Source: Ambulatory Visit | Attending: Nurse Practitioner | Admitting: Nurse Practitioner

## 2017-11-22 DIAGNOSIS — C7951 Secondary malignant neoplasm of bone: Secondary | ICD-10-CM | POA: Insufficient documentation

## 2017-11-22 DIAGNOSIS — R918 Other nonspecific abnormal finding of lung field: Secondary | ICD-10-CM | POA: Insufficient documentation

## 2017-11-22 DIAGNOSIS — C787 Secondary malignant neoplasm of liver and intrahepatic bile duct: Secondary | ICD-10-CM | POA: Diagnosis not present

## 2017-11-22 DIAGNOSIS — C439 Malignant melanoma of skin, unspecified: Secondary | ICD-10-CM | POA: Diagnosis not present

## 2017-11-22 LAB — GLUCOSE, CAPILLARY: GLUCOSE-CAPILLARY: 146 mg/dL — AB (ref 70–99)

## 2017-11-22 MED ORDER — FLUDEOXYGLUCOSE F - 18 (FDG) INJECTION
12.8200 | Freq: Once | INTRAVENOUS | Status: AC | PRN
  Administered 2017-11-22: 12.82 via INTRAVENOUS

## 2017-11-23 ENCOUNTER — Encounter: Payer: Self-pay | Admitting: Cardiology

## 2017-11-23 ENCOUNTER — Ambulatory Visit (INDEPENDENT_AMBULATORY_CARE_PROVIDER_SITE_OTHER): Payer: Medicare Other | Admitting: Cardiology

## 2017-11-23 ENCOUNTER — Other Ambulatory Visit: Payer: Self-pay | Admitting: Cardiology

## 2017-11-23 VITALS — BP 112/70 | HR 80 | Ht 72.0 in | Wt 256.0 lb

## 2017-11-23 DIAGNOSIS — I482 Chronic atrial fibrillation: Secondary | ICD-10-CM

## 2017-11-23 DIAGNOSIS — I4821 Permanent atrial fibrillation: Secondary | ICD-10-CM

## 2017-11-23 DIAGNOSIS — I10 Essential (primary) hypertension: Secondary | ICD-10-CM | POA: Diagnosis not present

## 2017-11-23 NOTE — Patient Instructions (Addendum)

## 2017-12-07 ENCOUNTER — Inpatient Hospital Stay (HOSPITAL_COMMUNITY): Payer: Medicare Other

## 2017-12-07 ENCOUNTER — Encounter (HOSPITAL_COMMUNITY): Payer: Self-pay | Admitting: Hematology

## 2017-12-07 ENCOUNTER — Other Ambulatory Visit: Payer: Self-pay

## 2017-12-07 ENCOUNTER — Inpatient Hospital Stay (HOSPITAL_COMMUNITY): Payer: Medicare Other | Attending: Hematology | Admitting: Hematology

## 2017-12-07 VITALS — BP 136/87 | HR 98 | Temp 97.6°F | Resp 18 | Wt 255.4 lb

## 2017-12-07 DIAGNOSIS — C7951 Secondary malignant neoplasm of bone: Secondary | ICD-10-CM | POA: Diagnosis not present

## 2017-12-07 DIAGNOSIS — R530 Neoplastic (malignant) related fatigue: Secondary | ICD-10-CM | POA: Diagnosis not present

## 2017-12-07 DIAGNOSIS — C7801 Secondary malignant neoplasm of right lung: Secondary | ICD-10-CM

## 2017-12-07 DIAGNOSIS — R531 Weakness: Secondary | ICD-10-CM | POA: Diagnosis not present

## 2017-12-07 DIAGNOSIS — Z5112 Encounter for antineoplastic immunotherapy: Secondary | ICD-10-CM | POA: Insufficient documentation

## 2017-12-07 DIAGNOSIS — C434 Malignant melanoma of scalp and neck: Secondary | ICD-10-CM | POA: Diagnosis present

## 2017-12-07 DIAGNOSIS — E119 Type 2 diabetes mellitus without complications: Secondary | ICD-10-CM | POA: Insufficient documentation

## 2017-12-07 DIAGNOSIS — C787 Secondary malignant neoplasm of liver and intrahepatic bile duct: Secondary | ICD-10-CM | POA: Insufficient documentation

## 2017-12-07 DIAGNOSIS — Z7901 Long term (current) use of anticoagulants: Secondary | ICD-10-CM | POA: Diagnosis not present

## 2017-12-07 DIAGNOSIS — C439 Malignant melanoma of skin, unspecified: Secondary | ICD-10-CM

## 2017-12-07 DIAGNOSIS — Z803 Family history of malignant neoplasm of breast: Secondary | ICD-10-CM | POA: Diagnosis not present

## 2017-12-07 DIAGNOSIS — Z79899 Other long term (current) drug therapy: Secondary | ICD-10-CM | POA: Diagnosis not present

## 2017-12-07 DIAGNOSIS — I4891 Unspecified atrial fibrillation: Secondary | ICD-10-CM | POA: Insufficient documentation

## 2017-12-07 DIAGNOSIS — Z7984 Long term (current) use of oral hypoglycemic drugs: Secondary | ICD-10-CM | POA: Diagnosis not present

## 2017-12-07 DIAGNOSIS — R600 Localized edema: Secondary | ICD-10-CM

## 2017-12-07 DIAGNOSIS — Z800 Family history of malignant neoplasm of digestive organs: Secondary | ICD-10-CM | POA: Diagnosis not present

## 2017-12-07 DIAGNOSIS — Z7189 Other specified counseling: Secondary | ICD-10-CM | POA: Insufficient documentation

## 2017-12-07 LAB — COMPREHENSIVE METABOLIC PANEL
ALBUMIN: 3.4 g/dL — AB (ref 3.5–5.0)
ALT: 21 U/L (ref 0–44)
AST: 26 U/L (ref 15–41)
Alkaline Phosphatase: 86 U/L (ref 38–126)
Anion gap: 10 (ref 5–15)
BUN: 10 mg/dL (ref 8–23)
CO2: 29 mmol/L (ref 22–32)
Calcium: 8.9 mg/dL (ref 8.9–10.3)
Chloride: 100 mmol/L (ref 98–111)
Creatinine, Ser: 1.02 mg/dL (ref 0.61–1.24)
GFR calc Af Amer: >60 mL/min (ref >60)
GFR calc non Af Amer: >60 mL/min (ref >60)
Glucose, Bld: 125 mg/dL — ABNORMAL HIGH (ref 70–99)
POTASSIUM: 3.4 mmol/L — AB (ref 3.5–5.1)
SODIUM: 139 mmol/L (ref 135–145)
Total Bilirubin: 1.1 mg/dL (ref 0.3–1.2)
Total Protein: 7.4 g/dL (ref 6.5–8.1)

## 2017-12-07 LAB — CBC WITH DIFFERENTIAL/PLATELET
Basophils Absolute: 0 10*3/uL (ref 0.0–0.1)
Basophils Relative: 0 %
EOS ABS: 0.1 10*3/uL (ref 0.0–0.7)
Eosinophils Relative: 1 %
HCT: 38.4 % — ABNORMAL LOW (ref 39.0–52.0)
HEMOGLOBIN: 12.3 g/dL — AB (ref 13.0–17.0)
LYMPHS ABS: 2.2 10*3/uL (ref 0.7–4.0)
Lymphocytes Relative: 27 %
MCH: 27.8 pg (ref 26.0–34.0)
MCHC: 32 g/dL (ref 30.0–36.0)
MCV: 86.9 fL (ref 78.0–100.0)
Monocytes Absolute: 0.7 10*3/uL (ref 0.1–1.0)
Monocytes Relative: 9 %
NEUTROS PCT: 63 %
Neutro Abs: 5.1 10*3/uL (ref 1.7–7.7)
Platelets: 306 10*3/uL (ref 150–400)
RBC: 4.42 MIL/uL (ref 4.22–5.81)
RDW: 15.4 % (ref 11.5–15.5)
WBC: 8.1 10*3/uL (ref 4.0–10.5)

## 2017-12-07 LAB — LACTATE DEHYDROGENASE: LDH: 189 U/L (ref 98–192)

## 2017-12-07 LAB — TSH: TSH: 0.95 u[IU]/mL (ref 0.350–4.500)

## 2017-12-07 MED ORDER — PROCHLORPERAZINE MALEATE 10 MG PO TABS: 10.0000 mg | ORAL_TABLET | Freq: Four times a day (QID) | ORAL | 0 refills | Status: DC | PRN

## 2017-12-07 NOTE — Patient Instructions (Signed)
[place] at [facility] Discharge Instructions  Follow up in 3 weeks with labs.  You will start treatment next week with labs.    Thank you for choosing [place] at [facility] to provide your oncology and hematology care.  To afford each patient quality time with our provider, please arrive at least 15 minutes before your scheduled appointment time.   If you have a lab appointment with the [facility] please come in thru the  Main Entrance and check in at the main information desk  You need to re-schedule your appointment should you arrive 10 or more minutes late.  We strive to give you quality time with our providers, and arriving late affects you and other patients whose appointments are after yours.  Also, if you no show three or more times for appointments you may be dismissed from the clinic at the providers discretion.     Again, thank you for choosing [facility].  Our hope is that these requests will decrease the amount of time that you wait before being seen by our physicians.       _____________________________________________________________  Should you have questions after your visit to [facility], please contact our office at [phone] between the hours of 8:00 a.m. and 4:30 p.m.  Voicemails left after 4:00 p.m. will not be returned until the following business day.  For prescription refill requests, have your pharmacy contact our office and allow 72 hours.    Cancer Center Support Programs:   > Cancer Support Group  2nd Tuesday of the month 1pm-2pm, Journey Room

## 2017-12-07 NOTE — Progress Notes (Signed)
 [facility] [address] [place], [place] [address]   CLINIC:  Medical Oncology/Hematology  PCP:  Winikur, Melinda, FNP [address] [place] [address] [phone]   REASON FOR VISIT: Follow-up for Malignant melanoma metastatic to the lung.  CURRENT THERAPY: Nivolumab (Opdivo) and Ipilimumab (Yervoy)   INTERVAL HISTORY:  Mr. Skipper 71 y.o. male returns for routine follow-up for malignant melanoma. He is here today to discuss the plan of treatment. He reports bilateral lower extremity edema and fatigue. He also experiences night sweats. He reports his appetite at 75% and is maintaining his weight at this time. His energy level is 25%. He is not very active. He denies any new pains. Denies any nausea, vomiting, or diarrhea. Denies any vision changes or headaches. Denies any bleeding anywhere.    REVIEW OF SYSTEMS:  Review of Systems  Constitutional: Positive for fatigue.       Night sweats  Cardiovascular: Positive for leg swelling.  Neurological: Positive for extremity weakness.  All other systems reviewed and are negative.    PAST MEDICAL/SURGICAL HISTORY:  Past Medical History:  Diagnosis Date  . Arthritis, degenerative   . Calculus of kidney   . Gout, unspecified   . Skin cancer   . Unspecified essential hypertension    Past Surgical History:  Procedure Laterality Date  . None       SOCIAL HISTORY:  Social History   Socioeconomic History  . Marital status: Married    Spouse name: Not on file  . Number of children: 1  . Years of education: Not on file  . Highest education level: Not on file  Occupational History  . Occupation: Retired    Comment: [employer]  Social Needs  . Financial resource strain: Not hard at all  . Food insecurity:    Worry: Never true    Inability: Never true  . Transportation needs:    Medical: No    Non-medical: No  Tobacco Use  . Smoking status: Never Smoker  . Smokeless tobacco:  Never Used  Substance and Sexual Activity  . Alcohol use: No  . Drug use: No  . Sexual activity: Not on file  Lifestyle  . Physical activity:    Days per week: 0 days    Minutes per session: 0 min  . Stress: To some extent  Relationships  . Social connections:    Talks on phone: More than three times a week    Gets together: Once a week    Attends religious service: 1 to 4 times per year    Active member of club or organization: No    Attends meetings of clubs or organizations: Never    Relationship status: Married  . Intimate partner violence:    Fear of current or ex partner: No    Emotionally abused: No    Physically abused: No    Forced sexual activity: No  Other Topics Concern  . Not on file  Social History Narrative  . Not on file    FAMILY HISTORY:  Family History  Problem Relation Age of Onset  . Heart disease Mother   . Colon cancer Mother   . Stroke Mother   . Alcohol abuse Father        Cause of death is Suicide  . Breast cancer Sister   . Mental illness Paternal Aunt   . Alcohol abuse Paternal Uncle     CURRENT MEDICATIONS:  Outpatient Encounter Medications as of   12/07/2017  Medication Sig  . allopurinol (ZYLOPRIM) 300 MG tablet Take 300 mg by mouth daily.  . atorvastatin (LIPITOR) 10 MG tablet Take 10 mg by mouth daily.  . diltiazem (CARDIZEM CD) 240 MG 24 hr capsule TAKE 1 CAPSULE (240 MG TOTAL) BY MOUTH EVERY DAY  . escitalopram (LEXAPRO) 10 MG tablet Take by mouth.  . flurazepam (DALMANE) 30 MG capsule Take 30 mg by mouth at bedtime.  . gabapentin (NEURONTIN) 300 MG capsule Take 300 mg by mouth 3 (three) times daily.  . lisinopril (PRINIVIL,ZESTRIL) 40 MG tablet Take 40 mg by mouth daily.   . metFORMIN (GLUCOPHAGE) 500 MG tablet Take 500 mg by mouth daily.  . metoprolol succinate (TOPROL-XL) 50 MG 24 hr tablet TAKE 1 TABLET BY MOUTH TWICE A DAY WITH FOOD  . Multiple Vitamin (MULTIVITAMIN) tablet Take 1 tablet by mouth daily.  . rivaroxaban  (XARELTO) 20 MG TABS tablet Take 1 tablet (20 mg total) by mouth daily with supper.   No facility-administered encounter medications on file as of 12/07/2017.     ALLERGIES:  No Known Allergies   PHYSICAL EXAM:  ECOG Performance status: 1  Vitals:   12/07/17 1308  BP: 136/87  Pulse: 98  Resp: 18  Temp: 97.6 F (36.4 C)  SpO2: 96%   Filed Weights   12/07/17 1308  Weight: 255 lb 6.4 oz (115.8 kg)    Physical Exam  Constitutional: He is oriented to person, place, and time. He appears well-developed and well-nourished.  Abdominal: Soft.  Musculoskeletal: Normal range of motion.  Neurological: He is alert and oriented to person, place, and time.  Skin: Skin is warm and dry.  Psychiatric: He has a normal mood and affect. His behavior is normal. Judgment and thought content normal.     LABORATORY DATA:  I have reviewed the labs as listed.  CBC    Component Value Date/Time   WBC 8.1 12/07/2017 1413   RBC 4.42 12/07/2017 1413   HGB 12.3 (L) 12/07/2017 1413   HCT 38.4 (L) 12/07/2017 1413   PLT 306 12/07/2017 1413   MCV 86.9 12/07/2017 1413   MCH 27.8 12/07/2017 1413   MCHC 32.0 12/07/2017 1413   RDW 15.4 12/07/2017 1413   LYMPHSABS 2.2 12/07/2017 1413   MONOABS 0.7 12/07/2017 1413   EOSABS 0.1 12/07/2017 1413   BASOSABS 0.0 12/07/2017 1413   CMP Latest Ref Rng & Units 12/07/2017 11/10/2017  Glucose 70 - 99 mg/dL 125(H) 138(H)  BUN 8 - 23 mg/dL 10 10  Creatinine 0.61 - 1.24 mg/dL 1.02 1.14  Sodium 135 - 145 mmol/L 139 140  Potassium 3.5 - 5.1 mmol/L 3.4(L) 3.1(L)  Chloride 98 - 111 mmol/L 100 103  CO2 22 - 32 mmol/L 29 25  Calcium 8.9 - 10.3 mg/dL 8.9 8.8(L)  Total Protein 6.5 - 8.1 g/dL 7.4 7.2  Total Bilirubin 0.3 - 1.2 mg/dL 1.1 1.2  Alkaline Phos 38 - 126 U/L 86 93  AST 15 - 41 U/L 26 21  ALT 0 - 44 U/L 21 14       DIAGNOSTIC IMAGING:  I have independently reviewed images of the PET CT scan dated 11/22/2017 and discussed with the patient in  detail.     ASSESSMENT & PLAN:   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  Metastatic melanoma to the lung, BRAF negative: - History of melanoma of the scalp vertex treated with Mohs surgery in November 2017, uncertain staging as no access to pathology. 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  Underwent modified most, diagnosed as a collision tumor (melanoma and carcinoma), likely metastatic.  Lymph nodes were clinically negative. -PET CT scan on 07/13/2017 shows hypermetabolic right lower lobe pulmonary nodule concerning for metastasis.  Additional pulmonary nodules of the right lung which are below resolution of the PET CT.  Focal highly hypermetabolic lesion of the descending colon, status post colonoscopy on 09/21/2017-tubulovillous, tubular and adenomatous polyps. - Underwent CT-guided right lower lobe lung biopsy on 10/05/2017-consistent with malignant melanoma. -Notes from Dr. Triozzi indicate BRAF V600 mutation was negative.  We will obtain reports of it. - We discussed about the normal prognosis of metastatic malignant melanoma.  We talked about treatment options including single agent ipilimumab/nivolumab  with the overall objective response rate of 40 to 45% and 4-year overall survival of 42%.  We also discussed response rates of combination immunotherapy with ipilimumab (3 mg/kg) and nivolumab (1 mg/kg) every 3 weeks for 4 doses followed by maintenance nivolumab to be around 60% with 3-year overall survival approaching 70%.  Overall survival in patients without a BRAF mutation is slightly lower. - Today have discussed the results of the PET CT scan dated 11/22/2017 which showed at least 3 hypermetabolic right lung nodules and several additional 2 small to characterize nodules in both lungs.  Hypermetabolic metastasis in the lateral segment  of the left lobe of liver noted.  At least 3 hypermetabolic peritoneal nodules identified.  Multifocal bone metastasis, predominantly in the left humerus, thoracic vertebra, right ilium and right femur noted.  I have also reviewed the results of the brain MRI which did not show any obvious metastatic disease. - I talked him about combination immunotherapy with ipilimumab and nivolumab for 4 cycles followed by nivolumab monthly.  We talked about the side effects in detail including but not limited to dermatitis, pneumonitis, hepatitis, hypophysitis, thyroid abnormalities, colitis among others.  He understands and gives his permission to proceed with the treatment.  We plan to rescan him after 3 cycles.  2.  Atrial fibrillation: -He is on Xarelto.  3.  Bone metastasis: -We also talked about starting him on denosumab monthly injections to decrease skeletal related events.  We talked about side effects including rare chance of osteonecrosis of the jaw.  I have recommended him to start taking calcium and vitamin D.  We plan to start this with cycle 2.      Orders placed this encounter:  Orders Placed This Encounter  Procedures  . CBC with Differential/Platelet  . Comprehensive metabolic panel  . TSH  . Lactate dehydrogenase  . CBC with Differential/Platelet  . Comprehensive metabolic panel  . Lactate dehydrogenase  . CBC with Differential/Platelet  . Comprehensive metabolic panel  . TSH  . Lactate dehydrogenase      Sreedhar Katragadda, MD [facility] [phone]  

## 2017-12-07 NOTE — Patient Instructions (Addendum)
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  We are going to treat you with immunotherapy.  We will treat you with palliative intent, which means your cancer is treatable but not curable.   You will get the drugs Opdivo (nivolumab) and Yervoy (ipilimumab).  You will get these every 2 weeks.  You will see the doctor regularly throughout treatment.  We monitor your lab work prior to every treatment. The doctor monitors your response to treatment by the way you are feeling, your blood work, and scans periodically.  There will be wait times while you are here for treatment.  It will take about 30 minutes to 1 hour for your lab work to result.  Then there will be wait times while pharmacy mixes your medications.   You will get the following pre-medications prior to each treatment: Benadryl - antihistamine to prevent allergic reaction to immunotherapy Pepcid - antihistamine to prevent allergic reaction to immunotherapy  Nivolumab (Opdivo)  About This Drug Nivolumab is used to treat cancer. It is given in the vein (IV).  Possible Side Effects . Bone marrow depression. This is a decrease in the number of white blood cells, red blood cells, and platelets. This may raise your risk of infection, make you tired and weak (fatigue), and raise your risk of bleeding. . Tiredness and weakness . Joint, muscle and bone pain . Back pain . Loose bowel movements (diarrhea) . Nausea . Decreased appetite (decreased hunger) . Constipation (not able to move bowels) . Cough and trouble breathing . Upper respiratory infection . Fever . Rash and itching . Electrolyte changes Note: Each of the side effects above was reported in 20% or greater of patients treated with nivolumab. Not all possible side effects are included above. Your side effects may be different or more severe if you receive nivolumab in combination with other chemotherapy  agents.  Warnings and Precautions . This drug works with your immune system and can cause inflammation in any of your organs and tissues and can change how they work. This may put you at risk for developing serious medical problems which can very rarely be fatal. . Colitis (swelling (inflammation) in the colon) - symptoms are loose bowel movements (diarrhea) stomach cramping, and sometimes blood in the bowel movements . Changes in liver function . Changes in kidney function . Inflammation (swelling) of the lungs which can very rarely be fatal - you may have a dry cough or trouble breathing. . This drug may affect some of your hormone glands (especially the thyroid, adrenals, pituitary and pancreas). . Blood sugar levels may change and you may develop diabetes. If you already have diabetes, changes may need to be made to your diabetes medication. . Severe allergic skin reaction which can very rarely be fatal. You may develop blisters on your skin that are filled with fluid or a severe red rash all over your body that may be painful. . Changes in your central nervous system can happen. The central nervous system is made up of your brain and spinal cord. You could feel extreme tiredness, agitation, confusion, hallucinations (see or hear things that are not there), trouble understanding or speaking, loss of control of your bowels or bladder, eyesight changes, numbness or lack of strength to your arms, legs, face, or body, and coma. If you start to have any of these symptoms let your doctor know right away. . While you are getting this  drug in your vein (IV), you may have a reaction to the drug. Sometimes you may be given medication to stop or lessen these side effects. Your nurse will check you closely for these signs: fever or shaking chills, flushing, facial swelling, feeling dizzy, headache, trouble breathing, rash, itching, chest tightness, or chest pain. These reactions may happen after your infusion.  If this happens, call 911 for emergency care. . Increased risk of complications, which may very rarely be fatal, in patients who will undergo a stem cell transplant after receiving nivolumab.  Important Information . This drug may be present in the saliva, tears, sweat, urine, stool, vomit, semen, and vaginal secretions. Talk to your doctor and/or your nurse about the necessary precautions to take during this time.  Treating Side Effects . To decrease infection, wash your hands regularly . Avoid close contact with people who have a cold, the flu, or other infections. . Take your temperature as your doctor or nurse tells you, and whenever you feel like you may have a fever . To help decrease bleeding, use a soft toothbrush. Check with your nurse before using dental floss. . Be very careful when using knives or tools . Use an electric shaver instead of a razor . Ask your doctor or nurse about medicines that are available to help stop or lessen constipation, diarrhea and/or nausea. . Drink plenty of fluids (a minimum of eight glasses per day is recommended). . If you are not able to move your bowels, check with your doctor or nurse before you use any enemas, laxatives, or suppositories . To help with nausea and vomiting, eat small, frequent meals instead of three large meals a day. Choose foods and drinks that are at room temperature. Ask your nurse or doctor about other helpful tips and medicine that is available to help or stop lessen these symptoms. . If you get diarrhea, eat low-fiber foods that are high in protein and calories and avoid foods that can irritate your digestive tracts or lead to cramping. Ask your nurse or doctor about medicine that can lessen or stop your diarrhea. . To help with decreased appetite, eat small, frequent meals . Eat high caloric food such as pudding, ice cream, yogurt and milkshakes. . Manage tiredness by pacing your activities for the day. Be sure to include  periods of rest between energy-draining activities . Keeping your pain under control is important to your wellbeing. Please tell your doctor or nurse if you are experiencing pain. . If you have diabetes, keep good control of your blood sugar level. Tell your nurse or your doctor if your glucose levels are higher or lower than normal . If you get a rash do not put anything on it unless your doctor or nurse says you may. Keep the area around the rash clean and dry. Ask your doctor for medicine if your rash bothers you. . Infusion reactions may occur after your infusion. If this happens, call 911 for emergency care.  Food and Drug Interactions . There are no known interactions of nivolumab with food or other medications. . Tell your doctor and pharmacist about all the medicines and dietary supplements (vitamins, minerals, herbs and others) that you are taking at this time. The safety and use of dietary supplements and alternative diets are often not known. Using these might affect your cancer or interfere with your treatment. Until more is known, you should not use dietary supplements or alternative diets without your cancer doctor's help.  When to  Call the Doctor Call your doctor or nurse if you have any of these symptoms and/or any new or unusual symptoms: . Fever of 100.5 F (38 C) or higher . Chills . Easy bleeding or bruising . Wheezing or trouble breathing . Dry cough, or cough with yellow, green or bloody mucus . Confusion and/or agitation . Hallucinations . Trouble understanding or speaking . Blurry vision or changes in your eyesight . Numbness or lack of strength to your arms, legs, face, or body . Feeling dizzy or lightheaded . Loose bowel movements (diarrhea) more than 4 times a day or diarrhea with weakness or lightheadedness . Nausea that stops you from eating or drinking, and/or that is not relieved by prescribed medicines . Lasting loss of appetite or rapid weight loss of five  pounds in a week . No bowel movement for 3 days or you feel uncomfortable . Bad abdominal pain, especially in upper right area . Fatigue or extreme weakness that interferes with normal activities . Decreased urine . Unusual thirst or passing urine often . Rash that is not relieved by prescribed medicines . Rash or itching . Flu-like symptoms: fever, headache, muscle and joint aches, and fatigue (low energy, feeling weak) . Signs of liver problems: dark urine, pale bowel movements, bad stomach pain, feeling very tired and weak, unusual itching, or yellowing of the eyes or skin. . Signs of infusion reaction: fever or shaking chills, flushing, facial swelling, feeling dizzy, headache, trouble breathing, rash, itching, chest tightness, or chest pain. . If you think you may be pregnant  Reproduction Warnings . Pregnancy warning: This drug can have harmful effects on the unborn baby. Women of child bearing potential should use effective methods of birth control during your cancer treatment and for at least 5 months after treatment. Let your doctor know right away if you think you may be pregnant. . Breastfeeding warning: It is not known if this drug passes into breast milk. For this reason, women should not breast feed during treatment because this drug could enter the breast milk and cause harm to a breast feeding baby. . Fertility warning: Human fertility studies have not been done with this drug. Talk with your doctor or nurse if you plan to have children. Ask for information on sperm or egg banking.   Ipilimumab Curt Bears)  About This Drug Ipilimumab is used to treat cancer. It is given in the vein (IV).  This drug will take 90 minutes to infuse.   Possible Side Effects . Tiredness . Loose bowel movements (diarrhea) . Colitis. This is swelling (inflammation) in the colon - symptoms are loose bowel movements (diarrhea) stomach cramping, and sometimes blood in the bowel movements . Nausea and  throwing up (vomiting) . Decreased appetite (decreased hunger) . Weight loss . Headache . Fever . Trouble sleeping . Rash and itching Note: Each of the side effects above was reported in 5% or greater of patients treated with ipilimumab. Not all possible side effects are included above.  Warnings and Precautions . This drug works with your immune system and can cause inflammation in any of your organs and tissues and can change how they work. This may put you at risk for developing serious medical problems which can very rarely be fatal. . Severe allergic skin reaction which can very rarely be fatal. You may develop blisters on your skin that are filled with fluid or a severe red rash all over your body that may be painful. . Severe colitis which can very  rarely be fatal. This is swelling (inflammation) in the colon. . Changes in your liver function which can very rarely cause liver failure and be fatal . This drug may affect some of your hormone glands (especially the thyroid, adrenals, pituitary and pancreas). . Inflammation of your nerves. You may feel numbness, tingling, or pain in your hands and feet. It may be hard for you to button your clothes, open jars, or walk as usual. The effect on the nerves may get worse with more doses of the drug. These effects get better in some people after the drug is stopped but it does not get better in all people. Very rarely, this can affect the nerves and muscles in your upper and lower body and cause paralysis. . Inflammation of your eye and/or other changes in eyesight  Important Information . This drug may be present in the saliva, tears, sweat, urine, stool, vomit, semen, and vaginal secretions. Talk to your doctor and/or your nurse about the necessary precautions to take during this time.  Treating Side Effects . Drink plenty of fluids (a minimum of eight glasses per day is recommended) . To help with decreased appetite, eat small, frequent  meals . Eat high caloric food such as pudding, ice cream, yogurt and milkshakes. . To help with weight loss, drink fluids that contribute calories (whole milk, juice, soft drinks, sweetened beverages, milkshakes, and nutritional supplements) instead of water. . Include a source of protein at every meal and snack, such as meat, poultry, fish, dry beans, tofu, eggs, nuts, milk, yogurt, cheese, ice cream, pudding, and nutritional supplements. . If you throw up or have loose bowel movements, you should drink more fluids so that you do not become dehydrated (lack water in the body from losing too much fluid). . To help with nausea and vomiting, eat small, frequent meals instead of three large meals a day. Choose foods and drinks that are at room temperature. Ask your nurse or doctor about other helpful tips and medicine that is available to help or stop lessen these symptoms. . If you get diarrhea, eat low-fiber foods that are high in protein and calories and avoid foods that can irritate your digestive tracts or lead to cramping. . Ask your nurse or doctor about medicine that can lessen or stop your diarrhea. Marland Kitchen Keeping your pain under control is important to your well-being. Please tell your doctor or nurse if you are experiencing pain. . If you get a rash do not put anything on it unless your doctor or nurse says you may. Keep the area around the rash clean and dry. Ask your doctor for medicine if your rash bothers you . If you are having trouble sleeping, talk to your nurse or doctor on tips to help you sleep better . If you have numbness and tingling in your hands and feet, be careful when cooking, walking, and handling sharp objects and hot liquids.  Food and Drug Interactions . There are no known interactions of ipilimumab with food or other medications. . Tell your doctor and pharmacist about all the medicines and dietary supplements (vitamins, minerals, herbs and others) that you are taking at  this time. The safety and use of dietary supplements and alternative diets are often not known. Using these might affect your cancer or interfere with your treatment. Until more is known, you should not use dietary supplements or alternative diets without your cancer doctor's help.  When to Call the Doctor Call your doctor or nurse if  you have any of these symptoms and/or any new or unusual symptoms: . Fever of 100.5 F (38 C) or higher . Chills . Flu-like symptoms: fever, headache, muscle and joint aches, and fatigue (low energy, feeling weak) . Lasting loss of appetite or rapid weight loss of five pounds in a week . Nausea that stops you from eating or drinking and/or is not relieved by prescribed medicines . Throwing up more than 3 times a day . Loose bowel movements (diarrhea) 4 times or loose bowel movements with lack of strength or a feeling of being dizzy . Pain in your abdomen that does not go away . Blood in your stool . Headache that does not go away . Blurred vision or other changes in eyesight . Signs of possible liver problems: dark urine, pale bowel movements, bad stomach pain, feeling very tired and weak, unusual itching, or yellowing of the eyes or skin . A new rash or a rash that is not relieved by prescribed medicines . Numbness, tingling, or pain your hands and feet . If you think you may be pregnant  Reproduction Warnings . Pregnancy warning: This drug can have harmful effects on the unborn baby. Women of child bearing potential should use effective methods of birth control during your cancer treatment and for at least 3 months after treatment. Let your doctor know right away if you think you may be pregnant . Breastfeeding warning: It is not known if this drug passes into breast milk. For this reason, women should not breast feed during treatment and for 3 months after treatment because this drug could enter the breast milk and cause harm to a breast feeding baby. .  Fertility warning: Human fertility studies have not been done with this drug. Talk with your doctor or nurse if you plan to have children. Ask for information on sperm or egg banking.  SELF CARE ACTIVITIES WHILE ON CHEMOTHERAPY:  Hydration Increase your fluid intake 48 hours prior to treatment and drink at least 8 to 12 cups (64 ounces) of water/decaffeinated beverages per day after treatment. You can still have your cup of coffee or soda but these beverages do not count as part of your 8 to 12 cups that you need to drink daily. No alcohol intake.  Medications Continue taking your normal prescription medication as prescribed.  If you start any new herbal or new supplements please let us know first to make sure it is safe.  Mouth Care Have teeth cleaned professionally before starting treatment. Keep dentures and partial plates clean. Use soft toothbrush and do not use mouthwashes that contain alcohol. Biotene is a good mouthwash that is available at most pharmacies or may be ordered by calling [phone]. Use warm salt water gargles (1 teaspoon salt per 1 quart warm water) before and after meals and at bedtime. Or you may rinse with 2 tablespoons of three-percent hydrogen peroxide mixed in eight ounces of water. If you are still having problems with your mouth or sores in your mouth please call the clinic. If you need dental work, please let the doctor know before you go for your appointment so that we can coordinate the best possible time for you in regards to your chemo regimen. You need to also let your dentist know that you are actively taking chemo. We may need to do labs prior to your dental appointment.  Skin Care Always use sunscreen that has not expired and with SPF (Sun Protection Factor) of 50 or higher. Wear  hats to protect your head from the sun. Remember to use sunscreen on your hands, ears, face, & feet.  Use good moisturizing lotions such as udder cream, eucerin, or even Vaseline.  Some chemotherapies can cause dry skin, color changes in your skin and nails.    . Avoid long, hot showers or baths. . Use gentle, fragrance-free soaps and laundry detergent. . Use moisturizers, preferably creams or ointments rather than lotions because the thicker consistency is better at preventing skin dehydration. Apply the cream or ointment within 15 minutes of showering. Reapply moisturizer at night, and moisturize your hands every time after you wash them.  Hair Loss (if your doctor says your hair will fall out)  . If your doctor says that your hair is likely to fall out, decide before you begin chemo whether you want to wear a wig. You may want to shop before treatment to match your hair color. . Hats, turbans, and scarves can also camouflage hair loss, although some people prefer to leave their heads uncovered. If you go bare-headed outdoors, be sure to use sunscreen on your scalp. . Cut your hair short. It eases the inconvenience of shedding lots of hair, but it also can reduce the emotional impact of watching your hair fall out. . Don't perm or color your hair during chemotherapy. Those chemical treatments are already damaging to hair and can enhance hair loss. Once your chemo treatments are done and your hair has grown back, it's OK to resume dyeing or perming hair. With chemotherapy, hair loss is almost always temporary. But when it grows back, it may be a different color or texture. In older adults who still had hair color before chemotherapy, the new growth may be completely gray.  Often, new hair is very fine and soft.  Infection Prevention Please wash your hands for at least 30 seconds using warm soapy water. Handwashing is the #1 way to prevent the spread of germs. Stay away from sick people or people who are getting over a cold. If you develop respiratory systems such as green/yellow mucus production or productive cough or persistent cough let us know and we will see if you need an  antibiotic. It is a good idea to keep a pair of gloves on when going into grocery stores/[employer] to decrease your risk of coming into contact with germs on the carts, etc. Carry alcohol hand gel with you at all times and use it frequently if out in public. If your temperature reaches 100.5 or higher please call the clinic and let us know.  If it is after hours or on the weekend please go to the ER if your temperature is over 100.5.  Please have your own personal thermometer at home to use.    Sex and bodily fluids If you are going to have sex, a condom must be used to protect the person that isn't taking chemotherapy. Chemo can decrease your libido (sex drive). For a few days after chemotherapy, chemotherapy can be excreted through your bodily fluids.  When using the toilet please close the lid and flush the toilet twice.  Do this for a few day after you have had chemotherapy.   Effects of chemotherapy on your sex life Some changes are simple and won't last long. They won't affect your sex life permanently. Sometimes you may feel: . too tired . not strong enough to be very active . sick or sore  . not in the mood . anxious or low Your anxiety  might not seem related to sex. For example, you may be worried about the cancer and how your treatment is going. Or you may be worried about money, or about how you family are coping with your illness. These things can cause stress, which can affect your interest in sex. It's important to talk to your partner about how you feel. Remember - the changes to your sex life don't usually last long. There's usually no medical reason to stop having sex during chemo. The drugs won't have any long term physical effects on your performance or enjoyment of sex. Cancer can't be passed on to your partner during sex  Contraception It's important to use reliable contraception during treatment. Avoid getting pregnant while you or your partner are having chemotherapy. This is  because the drugs may harm the baby. Sometimes chemotherapy drugs can leave a man or woman infertile.  This means you would not be able to have children in the future. You might want to talk to someone about permanent infertility. It can be very difficult to learn that you may no longer be able to have children. Some people find counselling helpful. There might be ways to preserve your fertility, although this is easier for men than for women. You may want to speak to a fertility expert. You can talk about sperm banking or harvesting your eggs. You can also ask about other fertility options, such as donor eggs. If you have or have had breast cancer, your doctor might advise you not to take the contraceptive pill. This is because the hormones in it might affect the cancer.  It is not known for sure whether or not chemotherapy drugs can be passed on through semen or secretions from the vagina. Because of this some doctors advise people to use a barrier method if you have sex during treatment. This applies to vaginal, anal or oral sex. Generally, doctors advise a barrier method only for the time you are actually having the treatment and for about a week after your treatment. Advice like this can be worrying, but this does not mean that you have to avoid being intimate with your partner. You can still have close contact with your partner and continue to enjoy sex.  Animals If you have cats or birds we just ask that you not change the litter or change the cage.  Please have someone else do this for you while you are on chemotherapy.   Food Safety During and After Cancer Treatment Food safety is important for people both during and after cancer treatment. Cancer and cancer treatments, such as chemotherapy, radiation therapy, and stem cell/bone marrow transplantation, often weaken the immune system. This makes it harder for your body to protect itself from foodborne illness, also called food poisoning. Foodborne  illness is caused by eating food that contains harmful bacteria, parasites, or viruses.  Foods to avoid Some foods have a higher risk of becoming tainted with bacteria. These include: Marland Kitchen Unwashed fresh fruit and vegetables, especially leafy vegetables that can hide dirt and other contaminants . Raw sprouts, such as alfalfa sprouts . Raw or undercooked beef, especially ground beef, or other raw or undercooked meat and poultry . Fatty, fried, or spicy foods immediately before or after treatment.  These can sit heavy on your stomach and make you feel nauseous. . Raw or undercooked shellfish, such as oysters. . Sushi and sashimi, which often contain raw fish.  . Unpasteurized beverages, such as unpasteurized fruit juices, raw milk, raw yogurt,  or cider . Undercooked eggs, such as soft boiled, over easy, and poached; raw, unpasteurized eggs; or foods made with raw egg, such as homemade raw cookie dough and homemade mayonnaise Simple steps for food safety Shop smart. . Do not buy food stored or displayed in an unclean area. . Do not buy bruised or damaged fruits or vegetables. . Do not buy cans that have cracks, dents, or bulges. . Pick up foods that can spoil at the end of your shopping trip and store them in a cooler on the way home. Prepare and clean up foods carefully. . Rinse all fresh fruits and vegetables under running water, and dry them with a clean towel or paper towel. . Clean the top of cans before opening them. . After preparing food, wash your hands for 20 seconds with hot water and soap. Pay special attention to areas between fingers and under nails. . Clean your utensils and dishes with hot water and soap. [place] Kitchen Disinfect your kitchen and cutting boards using 1 teaspoon of liquid, unscented bleach mixed into 1 quart of water.   Dispose of old food. . Eat canned and packaged food before its expiration date (the "use by" or "best before" date). . Consume refrigerated leftovers within 3  to 4 days. After that time, throw out the food. Even if the food does not smell or look spoiled, it still may be unsafe. Some bacteria, such as Listeria, can grow even on foods stored in the refrigerator if they are kept for too long. Take precautions when eating out. . At restaurants, avoid buffets and salad bars where food sits out for a long time and comes in contact with many people. Food can become contaminated when someone with a virus, often a norovirus, or another "bug" handles it. . Put any leftover food in a "to-go" container yourself, rather than having the server do it. And, refrigerate leftovers as soon as you get home. . Choose restaurants that are clean and that are willing to prepare your food as you order it cooked.   MEDICATIONS:                                                                                                                                                                Compazine/Prochlorperazine 10mg  tablet. Take 1 tablet every 6 hours as needed for nausea/vomiting. (This can make you sleepy)   EMLA cream. Apply a quarter size amount to port site 1 hour prior to chemo. Do not rub in. Cover with plastic wrap.   Over-the-Counter Meds:  Colace - 100 mg capsules - take 2 capsules daily.  If this doesn't help then you can increase to 2 capsules twice daily.  Call us if this does not help your bowels move.   Imodium  2mg  capsule. Take 2 capsules after the 1st loose stool and then 1 capsule every 2 hours until you go a total of 12 hours without having a loose stool. Call the [facility] if loose stools continue. If diarrhea occurs at bedtime, take 2 capsules at bedtime. Then take 2 capsules every 4 hours until morning. Call [facility].    Diarrhea Sheet   If you are having loose stools/diarrhea, please purchase Imodium and begin taking as outlined:  At the first sign of poorly formed or loose stools you should begin taking Imodium (loperamide) 2 mg  capsules.  Take two caplets (4mg ) followed by one caplet (2mg ) every 2 hours until you have had no diarrhea for 12 hours.  During the night take two caplets (4mg ) at bedtime and continue every 4 hours during the night until the morning.  Stop taking Imodium only after there is no sign of diarrhea for 12 hours.    Always call the [facility] if you are having loose stools/diarrhea that you can't get under control.  Loose stools/diarrhea leads to dehydration (loss of water) in your body.  We have other options of trying to get the loose stools/diarrhea to stop but you must let us know!   Constipation Sheet  Colace - 100 mg capsules - take 2 capsules daily.  If this doesn't help then you can increase to 2 capsules twice daily.  Please call if the above does not work for you.   Do not go more than 2 days without a bowel movement.  It is very important that you do not become constipated.  It will make you feel sick to your stomach (nausea) and can cause abdominal pain and vomiting.   Nausea Sheet   Compazine/Prochlorperazine 10mg  tablet. Take 1 tablet every 6 hours as needed for nausea/vomiting. (This can make you sleepy)  If you are having persistent nausea (nausea that does not stop) please call the [facility] and let us know the amount of nausea that you are experiencing.  If you begin to vomit, you need to call the [facility] and if it is the weekend and you have vomited more than one time and can't get it to stop-go to the Emergency Room.  Persistent nausea/vomiting can lead to dehydration (loss of fluid in your body) and will make you feel terrible.   Ice chips, sips of clear liquids, foods that are @ room temperature, crackers, and toast tend to be better tolerated.   SYMPTOMS TO REPORT AS SOON AS POSSIBLE AFTER TREATMENT:   FEVER GREATER THAN 100.5 F  CHILLS WITH OR WITHOUT FEVER  NAUSEA AND VOMITING THAT IS NOT CONTROLLED WITH YOUR NAUSEA MEDICATION  UNUSUAL SHORTNESS OF  BREATH  UNUSUAL BRUISING OR BLEEDING  TENDERNESS IN MOUTH AND THROAT WITH OR WITHOUT PRESENCE OF ULCERS  URINARY PROBLEMS  BOWEL PROBLEMS  UNUSUAL RASH      Wear comfortable clothing and clothing appropriate for easy access to any Portacath or PICC line. Let us know if there is anything that we can do to make your therapy better!    What to do if you need assistance after hours or on the weekends: CALL [phone].  HOLD on the line, do not hang up.  You will hear multiple messages but at the end you will be connected with a nurse triage line.  They will contact the doctor if necessary.  Most of the time they will be able to assist you.  Do not call the  hospital operator.      I have been informed and understand all of the instructions given to me and have received a copy. I have been instructed to call the clinic [phone] or my family physician as soon as possible for continued medical care, if indicated. I do not have any more questions at this time but understand that I may call the [facility] or the Patient Navigator at [phone] during office hours should I have questions or need assistance in obtaining follow-up care.

## 2017-12-07 NOTE — Assessment & Plan Note (Signed)
1.  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  Underwent modified most, diagnosed as a collision tumor (melanoma and carcinoma), likely metastatic.  Lymph nodes were clinically negative. -PET CT scan on 07/13/2017 shows hypermetabolic right lower lobe pulmonary nodule concerning for metastasis.  Additional pulmonary nodules of the right lung which are below resolution of the PET CT.  Focal highly hypermetabolic lesion of the descending colon, status post colonoscopy on 09/21/2017-tubulovillous, tubular and adenomatous polyps. - Underwent CT-guided right lower lobe lung biopsy on 10/05/2017-consistent with malignant melanoma. -Notes from Dr. Baltazar Najjar indicate BRAF V600 mutation was negative.  We will obtain reports of it. - We discussed about the normal prognosis of metastatic malignant melanoma.  We talked about treatment options including single agent ipilimumab/nivolumab  with the overall objective response rate of 40 to 45% and 4-year overall survival of 42%.  We also discussed response rates of combination immunotherapy with ipilimumab (3 mg/kg) and nivolumab (1 mg/kg) every 3 weeks for 4 doses followed by maintenance nivolumab to be around 60% with 3-year overall survival approaching 70%.  Overall survival in patients without a BRAF mutation is slightly lower. - Today have discussed the results of the PET CT scan dated 11/22/2017 which showed at least 3 hypermetabolic right lung nodules and several additional 2 small to characterize nodules in both lungs.  Hypermetabolic metastasis in the lateral segment of the left lobe of liver noted.  At least 3 hypermetabolic peritoneal nodules  identified.  Multifocal bone metastasis, predominantly in the left humerus, thoracic vertebra, right ilium and right femur noted.  I have also reviewed the results of the brain MRI which did not show any obvious metastatic disease. - I talked him about combination immunotherapy with ipilimumab and nivolumab for 4 cycles followed by nivolumab monthly.  We talked about the side effects in detail including but not limited to dermatitis, pneumonitis, hepatitis, hypophysitis, thyroid abnormalities, colitis among others.  He understands and gives his permission to proceed with the treatment.  We plan to rescan him after 3 cycles.  2.  Atrial fibrillation: -He is on Xarelto.  3.  Bone metastasis: -We also talked about starting him on denosumab monthly injections to decrease skeletal related events.  We talked about side effects including rare chance of osteonecrosis of the jaw.  I have recommended him to start taking calcium and vitamin D.  We plan to start this with cycle 2.

## 2017-12-07 NOTE — Progress Notes (Signed)
START ON PATHWAY REGIMEN - Melanoma   Nivolumab 1 mg/kg + Ipilimumab 3 mg/kg q21 Days x 4 Doses:   A cycle is every 21 days:     Nivolumab      Ipilimumab   **Always confirm dose/schedule in your pharmacy ordering system**  Nivolumab 240 mg q14 Days:   A cycle is every 14 days:     Nivolumab   **Always confirm dose/schedule in your pharmacy ordering system**  Patient Characteristics: Stage IV, Unresectable, Asymptomatic, First Line, BRAF V600 Wild Type / BRAF V600 Results Pending or Unknown Disease Subtype: Cutaneous Current Disease Status: Distant Metastases AJCC 8 Stage Grouping: IV AJCC T Category: TX AJCC N Category: NX AJCC M Category: M1d(1) BRAF V600 Mutation Status: BRAF V600 Wild Type (No Mutation) Metastatic Disease Type: Asymptomatic Line of Therapy: First Line Intent of Therapy: Non-Curative / Palliative Intent, Discussed with Patient

## 2017-12-08 ENCOUNTER — Encounter (HOSPITAL_COMMUNITY): Payer: Self-pay

## 2017-12-08 ENCOUNTER — Inpatient Hospital Stay (HOSPITAL_COMMUNITY): Payer: Medicare Other

## 2017-12-08 VITALS — BP 139/69 | HR 71 | Temp 98.0°F | Resp 20 | Wt 259.6 lb

## 2017-12-08 DIAGNOSIS — Z5112 Encounter for antineoplastic immunotherapy: Secondary | ICD-10-CM | POA: Diagnosis not present

## 2017-12-08 DIAGNOSIS — C7801 Secondary malignant neoplasm of right lung: Secondary | ICD-10-CM

## 2017-12-08 MED ORDER — SODIUM CHLORIDE 0.9 % IV SOLN
1.0000 mg/kg | Freq: Once | INTRAVENOUS | Status: AC
  Administered 2017-12-08: 116 mg via INTRAVENOUS
  Filled 2017-12-08: qty 10

## 2017-12-08 MED ORDER — FAMOTIDINE IN NACL 20-0.9 MG/50ML-% IV SOLN
INTRAVENOUS | Status: AC
  Filled 2017-12-08: qty 50

## 2017-12-08 MED ORDER — DIPHENHYDRAMINE HCL 50 MG/ML IJ SOLN
INTRAMUSCULAR | Status: AC
  Filled 2017-12-08: qty 1

## 2017-12-08 MED ORDER — SODIUM CHLORIDE 0.9 % IV SOLN
3.0000 mg/kg | Freq: Once | INTRAVENOUS | Status: AC
  Administered 2017-12-08: 350 mg via INTRAVENOUS
  Filled 2017-12-08: qty 40

## 2017-12-08 MED ORDER — SODIUM CHLORIDE 0.9% FLUSH
10.0000 mL | INTRAVENOUS | Status: DC | PRN
  Administered 2017-12-08: 10 mL
  Filled 2017-12-08: qty 10

## 2017-12-08 MED ORDER — FAMOTIDINE IN NACL 20-0.9 MG/50ML-% IV SOLN
20.0000 mg | Freq: Once | INTRAVENOUS | Status: AC
  Administered 2017-12-08: 20 mg via INTRAVENOUS

## 2017-12-08 MED ORDER — DIPHENHYDRAMINE HCL 50 MG/ML IJ SOLN
25.0000 mg | Freq: Once | INTRAMUSCULAR | Status: AC
  Administered 2017-12-08: 25 mg via INTRAVENOUS

## 2017-12-08 MED ORDER — SODIUM CHLORIDE 0.9 % IV SOLN
Freq: Once | INTRAVENOUS | Status: AC
  Administered 2017-12-08: 14:00:00 via INTRAVENOUS

## 2017-12-08 NOTE — Patient Instructions (Signed)
[facility] Discharge Instructions for Patients Receiving Chemotherapy  Today you received the following chemotherapy agents yervoy and opdivo.    If you develop nausea and vomiting that is not controlled by your nausea medication, call the clinic.   BELOW ARE SYMPTOMS THAT SHOULD BE REPORTED IMMEDIATELY:  *FEVER GREATER THAN 100.5 F  *CHILLS WITH OR WITHOUT FEVER  NAUSEA AND VOMITING THAT IS NOT CONTROLLED WITH YOUR NAUSEA MEDICATION  *UNUSUAL SHORTNESS OF BREATH  *UNUSUAL BRUISING OR BLEEDING  TENDERNESS IN MOUTH AND THROAT WITH OR WITHOUT PRESENCE OF ULCERS  *URINARY PROBLEMS  *BOWEL PROBLEMS  UNUSUAL RASH Items with * indicate a potential emergency and should be followed up as soon as possible.  Feel free to call the clinic should you have any questions or concerns. The clinic phone number is [phone].  Please show the [facility] at check-in to the Emergency Department and triage nurse.

## 2017-12-08 NOTE — Progress Notes (Signed)
Patient for Curt Bears and Opdivo today.   All questions asked and answered.  Wife at side.  No s/s of distress noted.    Patient tolerated therapy with no complaints voiced.  Peripheral IV site with good blood return before and after treatment.  No bruising or swelling noted at site.  Band aid applied.  VSs with discharge and left ambulatory with no s/s of distress noted.  Wife with patient.

## 2017-12-09 ENCOUNTER — Telehealth (HOSPITAL_COMMUNITY): Payer: Self-pay | Admitting: *Deleted

## 2017-12-09 NOTE — Telephone Encounter (Signed)
Contacted for 24 hour f/u.  Denies any s/s after first infusions Yervoy and Opdivo. Instructed to call the clinic with any questions or concerns. Verbalizes understanding.

## 2017-12-22 ENCOUNTER — Ambulatory Visit (HOSPITAL_COMMUNITY): Payer: Medicare Other

## 2017-12-22 ENCOUNTER — Other Ambulatory Visit (HOSPITAL_COMMUNITY): Payer: Medicare Other

## 2017-12-22 ENCOUNTER — Ambulatory Visit (HOSPITAL_COMMUNITY): Payer: Medicare Other | Admitting: Hematology

## 2017-12-29 ENCOUNTER — Inpatient Hospital Stay (HOSPITAL_COMMUNITY): Payer: Medicare Other

## 2017-12-29 ENCOUNTER — Other Ambulatory Visit: Payer: Self-pay

## 2017-12-29 ENCOUNTER — Inpatient Hospital Stay (HOSPITAL_COMMUNITY): Payer: Medicare Other | Attending: Hematology

## 2017-12-29 ENCOUNTER — Inpatient Hospital Stay (HOSPITAL_BASED_OUTPATIENT_CLINIC_OR_DEPARTMENT_OTHER): Payer: Medicare Other | Admitting: Internal Medicine

## 2017-12-29 ENCOUNTER — Encounter (HOSPITAL_COMMUNITY): Payer: Self-pay | Admitting: Internal Medicine

## 2017-12-29 VITALS — BP 131/68 | HR 86 | Temp 97.6°F | Resp 18

## 2017-12-29 VITALS — BP 152/77 | HR 84 | Temp 98.3°F | Resp 20 | Wt 258.4 lb

## 2017-12-29 DIAGNOSIS — I10 Essential (primary) hypertension: Secondary | ICD-10-CM | POA: Diagnosis not present

## 2017-12-29 DIAGNOSIS — Z8249 Family history of ischemic heart disease and other diseases of the circulatory system: Secondary | ICD-10-CM

## 2017-12-29 DIAGNOSIS — R530 Neoplastic (malignant) related fatigue: Secondary | ICD-10-CM | POA: Diagnosis not present

## 2017-12-29 DIAGNOSIS — C787 Secondary malignant neoplasm of liver and intrahepatic bile duct: Secondary | ICD-10-CM

## 2017-12-29 DIAGNOSIS — C434 Malignant melanoma of scalp and neck: Secondary | ICD-10-CM | POA: Insufficient documentation

## 2017-12-29 DIAGNOSIS — Z79899 Other long term (current) drug therapy: Secondary | ICD-10-CM | POA: Diagnosis not present

## 2017-12-29 DIAGNOSIS — I4891 Unspecified atrial fibrillation: Secondary | ICD-10-CM

## 2017-12-29 DIAGNOSIS — Z7901 Long term (current) use of anticoagulants: Secondary | ICD-10-CM | POA: Diagnosis not present

## 2017-12-29 DIAGNOSIS — C7951 Secondary malignant neoplasm of bone: Secondary | ICD-10-CM | POA: Insufficient documentation

## 2017-12-29 DIAGNOSIS — E119 Type 2 diabetes mellitus without complications: Secondary | ICD-10-CM

## 2017-12-29 DIAGNOSIS — Z7984 Long term (current) use of oral hypoglycemic drugs: Secondary | ICD-10-CM

## 2017-12-29 DIAGNOSIS — Z5112 Encounter for antineoplastic immunotherapy: Secondary | ICD-10-CM | POA: Insufficient documentation

## 2017-12-29 DIAGNOSIS — C7801 Secondary malignant neoplasm of right lung: Secondary | ICD-10-CM | POA: Insufficient documentation

## 2017-12-29 DIAGNOSIS — C439 Malignant melanoma of skin, unspecified: Secondary | ICD-10-CM

## 2017-12-29 HISTORY — DX: Secondary malignant neoplasm of bone: C79.51

## 2017-12-29 LAB — CBC WITH DIFFERENTIAL/PLATELET
Abs Immature Granulocytes: 0.05 10*3/uL (ref 0.00–0.07)
Basophils Absolute: 0.1 10*3/uL (ref 0.0–0.1)
Basophils Relative: 1 %
EOS ABS: 0.2 10*3/uL (ref 0.0–0.5)
EOS PCT: 2 %
HEMATOCRIT: 38.6 % — AB (ref 39.0–52.0)
Hemoglobin: 11.7 g/dL — ABNORMAL LOW (ref 13.0–17.0)
Immature Granulocytes: 1 %
LYMPHS ABS: 2.4 10*3/uL (ref 0.7–4.0)
Lymphocytes Relative: 28 %
MCH: 27 pg (ref 26.0–34.0)
MCHC: 30.3 g/dL (ref 30.0–36.0)
MCV: 88.9 fL (ref 80.0–100.0)
MONO ABS: 0.8 10*3/uL (ref 0.1–1.0)
MONOS PCT: 9 %
NRBC: 0 % (ref 0.0–0.2)
Neutro Abs: 5.1 10*3/uL (ref 1.7–7.7)
Neutrophils Relative %: 59 %
Platelets: 281 10*3/uL (ref 150–400)
RBC: 4.34 MIL/uL (ref 4.22–5.81)
RDW: 16.5 % — AB (ref 11.5–15.5)
WBC: 8.5 10*3/uL (ref 4.0–10.5)

## 2017-12-29 LAB — LACTATE DEHYDROGENASE: LDH: 155 U/L (ref 98–192)

## 2017-12-29 LAB — COMPREHENSIVE METABOLIC PANEL
ALT: 19 U/L (ref 0–44)
ANION GAP: 10 (ref 5–15)
AST: 21 U/L (ref 15–41)
Albumin: 3.2 g/dL — ABNORMAL LOW (ref 3.5–5.0)
Alkaline Phosphatase: 90 U/L (ref 38–126)
BILIRUBIN TOTAL: 0.7 mg/dL (ref 0.3–1.2)
BUN: 13 mg/dL (ref 8–23)
CO2: 27 mmol/L (ref 22–32)
Calcium: 8.7 mg/dL — ABNORMAL LOW (ref 8.9–10.3)
Chloride: 104 mmol/L (ref 98–111)
Creatinine, Ser: 1.07 mg/dL (ref 0.61–1.24)
GFR calc Af Amer: >60 mL/min (ref >60)
Glucose, Bld: 86 mg/dL (ref 70–99)
POTASSIUM: 3.4 mmol/L — AB (ref 3.5–5.1)
Sodium: 141 mmol/L (ref 135–145)
TOTAL PROTEIN: 6.8 g/dL (ref 6.5–8.1)

## 2017-12-29 LAB — TSH: TSH: 1.455 u[IU]/mL (ref 0.350–4.500)

## 2017-12-29 MED ORDER — SODIUM CHLORIDE 0.9 % IV SOLN
Freq: Once | INTRAVENOUS | Status: AC
  Administered 2017-12-29: 12:00:00 via INTRAVENOUS

## 2017-12-29 MED ORDER — DIPHENHYDRAMINE HCL 50 MG/ML IJ SOLN
25.0000 mg | Freq: Once | INTRAMUSCULAR | Status: AC
  Administered 2017-12-29: 25 mg via INTRAVENOUS
  Filled 2017-12-29: qty 1

## 2017-12-29 MED ORDER — FAMOTIDINE IN NACL 20-0.9 MG/50ML-% IV SOLN
20.0000 mg | Freq: Once | INTRAVENOUS | Status: AC
  Administered 2017-12-29: 20 mg via INTRAVENOUS
  Filled 2017-12-29: qty 50

## 2017-12-29 MED ORDER — SODIUM CHLORIDE 0.9 % IV SOLN
3.0000 mg/kg | Freq: Once | INTRAVENOUS | Status: AC
  Administered 2017-12-29: 350 mg via INTRAVENOUS
  Filled 2017-12-29: qty 70

## 2017-12-29 MED ORDER — SODIUM CHLORIDE 0.9% FLUSH
3.0000 mL | INTRAVENOUS | Status: DC | PRN
  Administered 2017-12-29: 3 mL
  Filled 2017-12-29: qty 10

## 2017-12-29 MED ORDER — SODIUM CHLORIDE 0.9 % IV SOLN
1.0000 mg/kg | Freq: Once | INTRAVENOUS | Status: AC
  Administered 2017-12-29: 116 mg via INTRAVENOUS
  Filled 2017-12-29: qty 1.6

## 2017-12-29 NOTE — Patient Instructions (Signed)
[facility] Discharge Instructions for Patients Receiving Chemotherapy   Beginning January 23rd 2017 lab work for the [facility] will be done in the  Main lab at [facility] on 1st floor. If you have a lab appointment with the [facility] please come in thru the  Main Entrance and check in at the main information desk   Today you received the following chemotherapy agents Opdivo and Yervoy. Follow-up as scheduled. Call clinic for any questions or concerns  To help prevent nausea and vomiting after your treatment, we encourage you to take your nausea medication.   If you develop nausea and vomiting, or diarrhea that is not controlled by your medication, call the clinic.  The clinic phone number is [phone]. Office hours are Monday-Friday 8:30am-5:00pm.  BELOW ARE SYMPTOMS THAT SHOULD BE REPORTED IMMEDIATELY:  *FEVER GREATER THAN 101.0 F  *CHILLS WITH OR WITHOUT FEVER  NAUSEA AND VOMITING THAT IS NOT CONTROLLED WITH YOUR NAUSEA MEDICATION  *UNUSUAL SHORTNESS OF BREATH  *UNUSUAL BRUISING OR BLEEDING  TENDERNESS IN MOUTH AND THROAT WITH OR WITHOUT PRESENCE OF ULCERS  *URINARY PROBLEMS  *BOWEL PROBLEMS  UNUSUAL RASH Items with * indicate a potential emergency and should be followed up as soon as possible. If you have an emergency after office hours please contact your primary care physician or go to the nearest emergency department.  Please call the clinic during office hours if you have any questions or concerns.   You may also contact the Patient Navigator at [phone] should you have any questions or need assistance in obtaining follow up care.      Resources For Cancer Patients and their Caregivers ? American Cancer Society: Can assist with transportation, wigs, general needs, runs Look Good Feel Better.        (385)039-6699 ? Cancer Care: Provides financial assistance, online support groups, medication/co-pay assistance.   1-800-813-HOPE 7016070233) ? [facility] Assists [place] cancer patients and their families through emotional , educational and financial support.  (339)469-5910 ? [place] DSS Where to apply for food stamps, Medicaid and utility assistance. 770-288-9418 ? [facility]: Transportation to medical appointments. 260-509-0513 ? Social Security Administration: May apply for disability if have a Stage IV cancer. [phone] 301-066-0579 ? [employer], Disability and Transit Services: Assists with nutrition, care and transit needs. (941)200-3897

## 2017-12-29 NOTE — Progress Notes (Signed)
1200 Labs reviewed with and pt seen by Dr. Walden Field today and pt approved for treatment per MD.                                                                           Elta Guadeloupe tolerated Yervoy and Opdivo infusions well without complaints or incident. Peripheral IV site checked by 2 RN's with positive blood return noted prior to and after each infusion.Questioned B.Sonny Masters ,pharmacist, regarding Yervoy infusion rate and time and informed by him to infuse over 30 minutes as the label says. VSS upon discharge. Pt discharged self ambulatory in satisfactory condition accompanied by his wife

## 2017-12-29 NOTE — Progress Notes (Signed)
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  Underwent modified most, diagnosed as a collision tumor (melanoma and carcinoma), likely metastatic.  Lymph nodes were clinically negative. -PET CT scan on 07/13/2017 shows hypermetabolic right lower lobe pulmonary nodule concerning for metastasis.  Additional pulmonary nodules of the right lung which are below resolution of the PET CT.  Focal highly hypermetabolic lesion of the descending colon, status post colonoscopy on 09/21/2017-tubulovillous, tubular and adenomatous polyps. - Underwent CT-guided right lower lobe lung biopsy on 10/05/2017-consistent with malignant melanoma. -Notes from Dr. Baltazar Najjar indicate BRAF V600 mutation was negative.   PET CT scan dated 11/22/2017 showed at least 3 hypermetabolic right lung nodules and several additional 2 small to characterize nodules in both lungs.  Hypermetabolic metastasis in the lateral segment of the left lobe  of liver noted.  At least 3 hypermetabolic peritoneal nodules identified.  Multifocal bone metastasis, predominantly in the left humerus, thoracic vertebra, right ilium and right femur noted.  Brain MRI which did not show any obvious metastatic disease.  Pt being treated with ipilimumab and nivolumab for 4 cycles followed by nivolumab monthly.  Ipilimumab (3 mg/kg) and nivolumab (1 mg/kg) every 3 weeks for 4 doses followed by maintenance nivolumab.    He is here for evaluation prior to C2 of IPI/Nivo.  He reports he tolerated C1 without problems. Labs done 12/29/2017 reviewed and showed WBC 8.5 HB 11.7 plts 281,000.  Chemistries WNL with K+ 3.4 Cr 1 and normal LFTs.  Bilirubin is 0.7   TSH 1.455.  Pt will follow-up with Dr. Worthy Keeler in 3 weeks with C3.     2.  Atrial fibrillation:  Pt on Xarelto.  Follow-up with PCP or cardiology as directed.    3.  Bone metastasis:  Pt was recommended for Xgeva 120 mg SQ  Monthly for prevention of skeletal related events.    4.  Fatigue.  TSH WNL at 1.455.  Will continue to monitor as therapy proceeds.    25 minutes spent with more than 50% spent in counseling and coordination of care.    Current Status:  Pt seen today for follow-up prior to Ipi/Nivo.  He reports some fatigue.      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       Problem List Patient Active Problem List   Diagnosis Date Noted  . Goals of care, counseling/discussion [Z71.89]  12/07/2017  . 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 Atrial fibrillation ([place]) [I48.91] 12/20/2011    Past Medical History Past Medical History:  Diagnosis Date  . Arthritis, degenerative   . Calculus of kidney   . Gout, unspecified   . Skin cancer   . Unspecified essential hypertension     Past Surgical History Past Surgical History:  Procedure Laterality Date  . None      Family History Family History  Problem Relation Age of Onset  . Heart disease Mother   . Colon cancer Mother   . Stroke Mother   . Alcohol abuse Father        Cause of death is Suicide  . Breast cancer Sister   . Mental illness Paternal Aunt   . Alcohol abuse Paternal Uncle      Social History  reports that he has never smoked. He has never used smokeless tobacco. He reports that he does not drink alcohol or use drugs.  Medications  Current Outpatient Medications:  .  allopurinol (ZYLOPRIM) 300 MG tablet, Take 300 mg by mouth daily., Disp: , Rfl:  .  atorvastatin (LIPITOR) 10 MG tablet, Take 10 mg by mouth daily., Disp: , Rfl:  .  diltiazem (CARDIZEM CD) 240 MG 24 hr capsule, TAKE 1 CAPSULE (240 MG TOTAL) BY MOUTH EVERY DAY, Disp: 90 capsule, Rfl: 3 .  flurazepam (DALMANE) 30 MG capsule, Take 30 mg by mouth at bedtime., Disp: , Rfl:  .  gabapentin (NEURONTIN) 300 MG capsule, Take 300 mg by mouth 3 (three) times daily., Disp: , Rfl:  .  Ipilimumab (YERVOY IV), Inject into the vein every 21 ( twenty-one) days. , Disp: , Rfl:  .  lisinopril (PRINIVIL,ZESTRIL) 40 MG tablet, Take 40 mg by mouth daily. , Disp: , Rfl:  .  metFORMIN (GLUCOPHAGE) 500 MG tablet, Take 500 mg by mouth daily., Disp: , Rfl: 3 .  metoprolol succinate (TOPROL-XL) 50 MG 24 hr tablet, TAKE 1 TABLET BY MOUTH TWICE A DAY WITH FOOD, Disp: 60 tablet, Rfl: 6 .  Multiple Vitamin (MULTIVITAMIN) tablet, Take 1 tablet by mouth daily., Disp: , Rfl:  .  Nivolumab (OPDIVO IV), Inject into the vein every 21 (  twenty-one) days. , Disp: , Rfl:  .  rivaroxaban (XARELTO) 20 MG TABS tablet, Take 1 tablet (20 mg total) by mouth daily with supper., Disp: 90 tablet, Rfl: 1 .  prochlorperazine (COMPAZINE) 10 MG tablet, Take 1 tablet (10 mg total) by mouth every 6 (six) hours as needed for nausea or vomiting. (Patient not taking: Reported on 12/29/2017), Disp: 30 tablet, Rfl: 0 No current facility-administered medications for this visit.   Facility-Administered Medications Ordered in Other Visits:  .  sodium chloride flush (NS) 0.9 % injection 3 mL, 3 mL, Intracatheter, PRN, Cicely Ortner, MD, 3 mL at 12/29/17 1207  Allergies Patient has no known allergies.  Review of Systems Review of Systems - Oncology ROS negative other than fatigue   Physical Exam  Vitals Wt Readings from Last 3 Encounters:  12/29/17 258 lb 6.4 oz (117.2 kg)  12/08/17 259 lb 9.6 oz (117.8 kg)  12/07/17 255 lb 6.4 oz (115.8 kg)  Temp Readings from Last 3 Encounters:  12/29/17 98.3 F (36.8 C) (Oral)  12/29/17 97.6 F (36.4 C) (Oral)  12/08/17 98 F (36.7 C) (Oral)   BP Readings from Last 3 Encounters:  12/29/17 (!) 152/77  12/29/17 131/68  12/08/17 139/69   Pulse Readings from Last 3 Encounters:  12/29/17 84  12/29/17 86  12/08/17 71    Constitutional: Well-developed, well-nourished, and in no distress.   HENT: Head: Normocephalic and atraumatic.  Mouth/Throat: No oropharyngeal exudate. Mucosa moist. Eyes: Pupils are equal, round, and reactive to light. Conjunctivae are normal. No scleral icterus.  Neck: Normal range of motion. Neck supple. No JVD present.  Cardiovascular: Normal rate, regular rhythm and normal heart sounds.  Exam reveals no gallop and no friction rub.   No murmur heard. Pulmonary/Chest: Effort normal and breath sounds normal. No respiratory distress. No wheezes.No rales.  Abdominal: Soft. Bowel sounds are normal. No distension. There is no tenderness. There is no guarding.  Musculoskeletal:  No edema or tenderness.  Lymphadenopathy: No cervical, axillary or supraclavicular adenopathy.  Neurological: Alert and oriented to person, place, and time. No cranial nerve deficit.  Skin: Skin is warm and dry. No rash noted. No erythema. No pallor.  Psychiatric: Affect and judgment normal.   Labs Appointment on 12/29/2017  Component Date Value Ref Range Status  . WBC 12/29/2017 8.5  4.0 - 10.5 K/uL Final  . RBC 12/29/2017 4.34  4.22 - 5.81 MIL/uL Final  . Hemoglobin 12/29/2017 11.7* 13.0 - 17.0 g/dL Final  . HCT 12/29/2017 38.6* 39.0 - 52.0 % Final  . MCV 12/29/2017 88.9  80.0 - 100.0 fL Final  . MCH 12/29/2017 27.0  26.0 - 34.0 pg Final  . MCHC 12/29/2017 30.3  30.0 - 36.0 g/dL Final  . RDW 12/29/2017 16.5* 11.5 - 15.5 % Final  . Platelets 12/29/2017 281  150 - 400 K/uL Final  . nRBC 12/29/2017 0.0  0.0 - 0.2 % Final  . Neutrophils Relative % 12/29/2017 59  % Final  . Neutro Abs 12/29/2017 5.1  1.7 - 7.7 K/uL Final  . Lymphocytes Relative 12/29/2017 28  % Final  . Lymphs Abs 12/29/2017 2.4  0.7 - 4.0 K/uL Final  . Monocytes Relative 12/29/2017 9  % Final  . Monocytes Absolute 12/29/2017 0.8  0.1 - 1.0 K/uL Final  . Eosinophils Relative 12/29/2017 2  % Final  . Eosinophils Absolute 12/29/2017 0.2  0.0 - 0.5 K/uL Final  . Basophils Relative 12/29/2017 1  % Final  . Basophils Absolute 12/29/2017 0.1  0.0 - 0.1 K/uL Final  . Immature Granulocytes 12/29/2017 1  % Final  . Abs Immature Granulocytes 12/29/2017 0.05  0.00 - 0.07 K/uL Final   Performed at [facility], [address], [place], [place] [address]  . Sodium 12/29/2017 141  135 - 145 mmol/L Final  . Potassium 12/29/2017 3.4* 3.5 - 5.1 mmol/L Final  . Chloride 12/29/2017 104  98 - 111 mmol/L Final  . CO2 12/29/2017 27  22 - 32 mmol/L Final  . Glucose, Bld 12/29/2017 86  70 - 99 mg/dL Final  . BUN 12/29/2017 13  8 - 23 mg/dL Final  . Creatinine, Ser 12/29/2017 1.07  0.61 - 1.24 mg/dL Final  . Calcium 12/29/2017 8.7* 8.9 -  10.3 mg/dL Final  . Total Protein 12/29/2017 6.8  6.5 - 8.1 g/dL Final  . Albumin 12/29/2017 3.2* 3.5 - 5.0 g/dL Final  . AST 12/29/2017 21  15 - 41 U/L Final  .  ALT 12/29/2017 19  0 - 44 U/L Final  . Alkaline Phosphatase 12/29/2017 90  38 - 126 U/L Final  . Total Bilirubin 12/29/2017 0.7  0.3 - 1.2 mg/dL Final  . GFR calc non Af Amer 12/29/2017 >60  >60 mL/min Final  . GFR calc Af Amer 12/29/2017 >60  >60 mL/min Final   Comment: (NOTE) The eGFR has been calculated using the CKD EPI equation. This calculation has not been validated in all clinical situations. eGFR's persistently <60 mL/min signify possible Chronic Kidney Disease.   Georgiann Hahn gap 12/29/2017 10  5 - 15 Final   Performed at [facility], [address], [place], [place] [address]  . TSH 12/29/2017 1.455  0.350 - 4.500 uIU/mL Final   Comment: Performed by a 3rd Generation assay with a functional sensitivity of <=0.01 uIU/mL. Performed at [facility], [address], [place], [place] [address]   . LDH 12/29/2017 155  98 - 192 U/L Final   Performed at [facility], [address] [place],  [address]     Pathology Orders Placed This Encounter  Procedures  . CBC with Differential/Platelet    Standing Status:   Future    Standing Expiration Date:   12/30/2018  . Comprehensive metabolic panel    Standing Status:   Future    Standing Expiration Date:   12/30/2018  . Lactate dehydrogenase    Standing Status:   Future    Standing Expiration Date:   12/30/2018       Zoila Shutter MD

## 2018-01-20 ENCOUNTER — Inpatient Hospital Stay (HOSPITAL_BASED_OUTPATIENT_CLINIC_OR_DEPARTMENT_OTHER): Payer: Medicare Other | Admitting: Hematology

## 2018-01-20 ENCOUNTER — Other Ambulatory Visit: Payer: Self-pay

## 2018-01-20 ENCOUNTER — Inpatient Hospital Stay (HOSPITAL_COMMUNITY): Payer: Medicare Other

## 2018-01-20 ENCOUNTER — Encounter (HOSPITAL_COMMUNITY): Payer: Self-pay | Admitting: Hematology

## 2018-01-20 ENCOUNTER — Inpatient Hospital Stay (HOSPITAL_COMMUNITY): Payer: Medicare Other | Attending: Hematology

## 2018-01-20 VITALS — BP 114/50 | HR 85 | Temp 97.7°F | Resp 18

## 2018-01-20 VITALS — BP 124/64 | HR 98 | Temp 97.9°F | Resp 20 | Wt 247.0 lb

## 2018-01-20 DIAGNOSIS — C7951 Secondary malignant neoplasm of bone: Secondary | ICD-10-CM | POA: Insufficient documentation

## 2018-01-20 DIAGNOSIS — C7801 Secondary malignant neoplasm of right lung: Secondary | ICD-10-CM | POA: Diagnosis not present

## 2018-01-20 DIAGNOSIS — Z7901 Long term (current) use of anticoagulants: Secondary | ICD-10-CM | POA: Insufficient documentation

## 2018-01-20 DIAGNOSIS — Z803 Family history of malignant neoplasm of breast: Secondary | ICD-10-CM | POA: Diagnosis not present

## 2018-01-20 DIAGNOSIS — C787 Secondary malignant neoplasm of liver and intrahepatic bile duct: Secondary | ICD-10-CM

## 2018-01-20 DIAGNOSIS — R531 Weakness: Secondary | ICD-10-CM | POA: Insufficient documentation

## 2018-01-20 DIAGNOSIS — R5383 Other fatigue: Secondary | ICD-10-CM | POA: Diagnosis not present

## 2018-01-20 DIAGNOSIS — Z5112 Encounter for antineoplastic immunotherapy: Secondary | ICD-10-CM | POA: Diagnosis present

## 2018-01-20 DIAGNOSIS — E119 Type 2 diabetes mellitus without complications: Secondary | ICD-10-CM | POA: Insufficient documentation

## 2018-01-20 DIAGNOSIS — I4891 Unspecified atrial fibrillation: Secondary | ICD-10-CM | POA: Diagnosis not present

## 2018-01-20 DIAGNOSIS — Z79899 Other long term (current) drug therapy: Secondary | ICD-10-CM | POA: Insufficient documentation

## 2018-01-20 DIAGNOSIS — R42 Dizziness and giddiness: Secondary | ICD-10-CM | POA: Diagnosis not present

## 2018-01-20 DIAGNOSIS — C3491 Malignant neoplasm of unspecified part of right bronchus or lung: Secondary | ICD-10-CM | POA: Diagnosis not present

## 2018-01-20 DIAGNOSIS — I10 Essential (primary) hypertension: Secondary | ICD-10-CM | POA: Diagnosis not present

## 2018-01-20 DIAGNOSIS — K5900 Constipation, unspecified: Secondary | ICD-10-CM | POA: Insufficient documentation

## 2018-01-20 DIAGNOSIS — Z8249 Family history of ischemic heart disease and other diseases of the circulatory system: Secondary | ICD-10-CM

## 2018-01-20 DIAGNOSIS — Z85828 Personal history of other malignant neoplasm of skin: Secondary | ICD-10-CM

## 2018-01-20 DIAGNOSIS — Z800 Family history of malignant neoplasm of digestive organs: Secondary | ICD-10-CM | POA: Insufficient documentation

## 2018-01-20 DIAGNOSIS — Z7984 Long term (current) use of oral hypoglycemic drugs: Secondary | ICD-10-CM | POA: Insufficient documentation

## 2018-01-20 LAB — LACTATE DEHYDROGENASE: LDH: 144 U/L (ref 98–192)

## 2018-01-20 LAB — CBC WITH DIFFERENTIAL/PLATELET
Abs Immature Granulocytes: 0.01 10*3/uL (ref 0.00–0.07)
BASOS PCT: 1 %
Basophils Absolute: 0 10*3/uL (ref 0.0–0.1)
EOS ABS: 0.2 10*3/uL (ref 0.0–0.5)
EOS PCT: 3 %
HEMATOCRIT: 41.1 % (ref 39.0–52.0)
Hemoglobin: 12.3 g/dL — ABNORMAL LOW (ref 13.0–17.0)
Immature Granulocytes: 0 %
LYMPHS ABS: 2 10*3/uL (ref 0.7–4.0)
Lymphocytes Relative: 29 %
MCH: 26.1 pg (ref 26.0–34.0)
MCHC: 29.9 g/dL — AB (ref 30.0–36.0)
MCV: 87.1 fL (ref 80.0–100.0)
MONOS PCT: 7 %
Monocytes Absolute: 0.5 10*3/uL (ref 0.1–1.0)
NEUTROS PCT: 60 %
Neutro Abs: 4.2 10*3/uL (ref 1.7–7.7)
PLATELETS: 243 10*3/uL (ref 150–400)
RBC: 4.72 MIL/uL (ref 4.22–5.81)
RDW: 16.4 % — AB (ref 11.5–15.5)
WBC: 7 10*3/uL (ref 4.0–10.5)
nRBC: 0 % (ref 0.0–0.2)

## 2018-01-20 LAB — COMPREHENSIVE METABOLIC PANEL
ALT: 15 U/L (ref 0–44)
ANION GAP: 10 (ref 5–15)
AST: 24 U/L (ref 15–41)
Albumin: 3.6 g/dL (ref 3.5–5.0)
Alkaline Phosphatase: 105 U/L (ref 38–126)
BILIRUBIN TOTAL: 0.8 mg/dL (ref 0.3–1.2)
BUN: 12 mg/dL (ref 8–23)
CHLORIDE: 103 mmol/L (ref 98–111)
CO2: 27 mmol/L (ref 22–32)
Calcium: 9.1 mg/dL (ref 8.9–10.3)
Creatinine, Ser: 1.14 mg/dL (ref 0.61–1.24)
Glucose, Bld: 163 mg/dL — ABNORMAL HIGH (ref 70–99)
POTASSIUM: 3.3 mmol/L — AB (ref 3.5–5.1)
Sodium: 140 mmol/L (ref 135–145)
TOTAL PROTEIN: 7.3 g/dL (ref 6.5–8.1)

## 2018-01-20 MED ORDER — FAMOTIDINE IN NACL 20-0.9 MG/50ML-% IV SOLN
20.0000 mg | Freq: Once | INTRAVENOUS | Status: AC
  Administered 2018-01-20: 20 mg via INTRAVENOUS
  Filled 2018-01-20: qty 50

## 2018-01-20 MED ORDER — DIPHENHYDRAMINE HCL 50 MG/ML IJ SOLN
25.0000 mg | Freq: Once | INTRAMUSCULAR | Status: AC
  Administered 2018-01-20: 25 mg via INTRAVENOUS
  Filled 2018-01-20: qty 1

## 2018-01-20 MED ORDER — SODIUM CHLORIDE 0.9 % IV SOLN
1.0000 mg/kg | Freq: Once | INTRAVENOUS | Status: AC
  Administered 2018-01-20: 116 mg via INTRAVENOUS
  Filled 2018-01-20: qty 10

## 2018-01-20 MED ORDER — SODIUM CHLORIDE 0.9 % IV SOLN
3.0000 mg/kg | Freq: Once | INTRAVENOUS | Status: AC
  Administered 2018-01-20: 350 mg via INTRAVENOUS
  Filled 2018-01-20: qty 30

## 2018-01-20 MED ORDER — SODIUM CHLORIDE 0.9 % IV SOLN
Freq: Once | INTRAVENOUS | Status: AC
  Administered 2018-01-20: 10:00:00 via INTRAVENOUS

## 2018-01-20 MED ORDER — INFLUENZA VAC SPLIT HIGH-DOSE 0.5 ML IM SUSY
0.5000 mL | PREFILLED_SYRINGE | Freq: Once | INTRAMUSCULAR | Status: DC
  Filled 2018-01-20: qty 0.5

## 2018-01-20 MED ORDER — INFLUENZA VAC SPLIT HIGH-DOSE 0.5 ML IM SUSY
PREFILLED_SYRINGE | INTRAMUSCULAR | Status: AC
  Filled 2018-01-20: qty 0.5

## 2018-01-20 NOTE — Progress Notes (Signed)
[place] reviewed with and pt seen by Dr. Delton Coombes and pt approved for chemo tx today per MD                                                       Elta Guadeloupe tolerated chemo tx well without complaints or incident. Peripheral IV site checked by 2 RN's with positive blood return prior to and after infusions.VSS upon discharge. Pt discharged self ambulatory in satisfactory condition

## 2018-01-20 NOTE — Patient Instructions (Signed)
[facility] Discharge Instructions for Patients Receiving Chemotherapy   Beginning January 23rd 2017 lab work for the [facility] will be done in the  Main lab at [facility] on 1st floor. If you have a lab appointment with the [facility] please come in thru the  Main Entrance and check in at the main information desk   Today you received the following chemotherapy agents Opdivo and Yervoy. Follow-up as scheduled. Call clinic for any questions or concerns  To help prevent nausea and vomiting after your treatment, we encourage you to take your nausea medication   If you develop nausea and vomiting, or diarrhea that is not controlled by your medication, call the clinic.  The clinic phone number is [phone]. Office hours are Monday-Friday 8:30am-5:00pm.  BELOW ARE SYMPTOMS THAT SHOULD BE REPORTED IMMEDIATELY:  *FEVER GREATER THAN 101.0 F  *CHILLS WITH OR WITHOUT FEVER  NAUSEA AND VOMITING THAT IS NOT CONTROLLED WITH YOUR NAUSEA MEDICATION  *UNUSUAL SHORTNESS OF BREATH  *UNUSUAL BRUISING OR BLEEDING  TENDERNESS IN MOUTH AND THROAT WITH OR WITHOUT PRESENCE OF ULCERS  *URINARY PROBLEMS  *BOWEL PROBLEMS  UNUSUAL RASH Items with * indicate a potential emergency and should be followed up as soon as possible. If you have an emergency after office hours please contact your primary care physician or go to the nearest emergency department.  Please call the clinic during office hours if you have any questions or concerns.   You may also contact the Patient Navigator at [phone] should you have any questions or need assistance in obtaining follow up care.      Resources For Cancer Patients and their Caregivers ? American Cancer Society: Can assist with transportation, wigs, general needs, runs Look Good Feel Better.        ([phone] 826-6764 ? Cancer Care: Provides financial assistance, online support groups, medication/co-pay assistance.   1-800-813-HOPE 5188061907) ? [facility] Assists [place] cancer patients and their families through emotional , educational and financial support.  980-787-5014 ? [place] DSS Where to apply for food stamps, Medicaid and utility assistance. 267-526-0099 ? [facility]: Transportation to medical appointments. 682-191-9550 ? Social Security Administration: May apply for disability if have a Stage IV cancer. 9144339875 9522796226 ? [employer], Disability and Transit Services: Assists with nutrition, care and transit needs. [phone]

## 2018-01-20 NOTE — Assessment & Plan Note (Signed)
1.  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  Underwent modified most, diagnosed as a collision tumor (melanoma and carcinoma), likely metastatic.  Lymph nodes were clinically negative. -PET CT scan on 07/13/2017 shows hypermetabolic right lower lobe pulmonary nodule concerning for metastasis.  Additional pulmonary nodules of the right lung which are below resolution of the PET CT.  Focal highly hypermetabolic lesion of the descending colon, status post colonoscopy on 09/21/2017-tubulovillous, tubular and adenomatous polyps. - Underwent CT-guided right lower lobe lung biopsy on 10/05/2017-consistent with malignant melanoma. -Notes from Dr. Baltazar Najjar indicate BRAF V600 mutation was negative.  We will obtain reports of it. - We discussed about the normal prognosis of metastatic malignant melanoma.  We talked about treatment options including single agent ipilimumab/nivolumab  with the overall objective response rate of 40 to 45% and 4-year overall survival of 42%.  We also discussed response rates of combination immunotherapy with ipilimumab (3 mg/kg) and nivolumab (1 mg/kg) every 3 weeks for 4 doses followed by maintenance nivolumab to be around 60% with 3-year overall survival approaching 70%.  Overall survival in patients without a BRAF mutation is slightly lower. -PET CT scan dated 11/22/2017  showed at least 3 hypermetabolic right lung nodules and several additional 2 small to characterize nodules in both lungs.  Hypermetabolic metastasis in the lateral segment of the left lobe of liver noted.  At least 3 hypermetabolic peritoneal nodules identified.  Multifocal bone metastasis,  predominantly in the left humerus, thoracic vertebra, right ilium and right femur noted.  I have also reviewed the results of the brain MRI which did not show any obvious metastatic disease. - Combination immunotherapy with Yervoy (3 mg/kg) and Opdivo (1 mg/kg) cycle 1 on 12/08/2017 and cycle 2 on 12/29/2017. -He has tolerated combination immunotherapy very well.  He did not have any immunotherapy related side effects.  We reviewed his blood work.  They are within normal limits. -He may proceed with cycle 3 today.  I plan to repeat PET CT scan prior to next visit in 3 weeks.  2.  Atrial fibrillation: -He is on Xarelto.  3.  Bone metastasis: -He was recommended to take calcium and vitamin D. -I also talked to him about starting him on Xgeva to decrease skeletal related events.  We talked about side effects in detail. -We will start Xgeva along with monthly Opdivo.

## 2018-01-20 NOTE — Progress Notes (Signed)
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  CURRENT THERAPY: Nivolumab (Opdivo) and Ipilimumab Curt Bears)  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, right ([place])   11/10/2017 Initial Diagnosis    Metastatic melanoma to lung, right ([place])    12/07/2017 -  Chemotherapy    The patient had ipilimumab (YERVOY) 350 mg in sodium chloride 0.9 % 150 mL chemo infusion, 3 mg/kg = 350 mg, Intravenous,  Once, 3 of 4 cycles Administration: 350 mg (12/08/2017), 350 mg (12/29/2017), 350 mg (01/20/2018) nivolumab (OPDIVO) 116 mg in sodium chloride 0.9 % 100 mL chemo infusion, 1 mg/kg = 116 mg, Intravenous, Once, 3 of 10 cycles Administration: 116 mg (12/08/2017), 116 mg (12/29/2017), 116 mg (01/20/2018)  for chemotherapy treatment.      INTERVAL HISTORY:  Christopher Baird 72 y.o. 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 He reports weakness and fatigued throughout the day. He has constipation occasionally. He had one episode of feeling dizzy like he was going to pass out but once he sat down he was fine. He feels he just got too hot. He denies any new cough or hemoptysis. Denies any skin rashes. Denies any fevers or recent infections. He reports his appetite at 75% and his energy level is 25%.    REVIEW OF SYSTEMS:  Review of Systems  Constitutional: Positive for fatigue.  Gastrointestinal: Positive for constipation.  Neurological: Positive for extremity weakness.  All other systems reviewed and are negative.    PAST MEDICAL/SURGICAL HISTORY:  Past Medical History:  Diagnosis Date  . Arthritis, degenerative   . Bone metastases ([place]) 12/29/2017  . Calculus of kidney   . Gout, unspecified   . Skin cancer   . Unspecified essential  hypertension    Past Surgical History:  Procedure Laterality Date  . None       SOCIAL HISTORY:  Social History   Socioeconomic History  . Marital status: Married    Spouse name: Not on file  . Number of children: 1  . Years of education: Not on file  . Highest education level: Not on file  Occupational History  . Occupation: Retired    Comment: Chief Financial Officer  Social Needs  . Financial resource strain: Not hard at all  . Food insecurity:    Worry: Never true    Inability: Never true  . Transportation needs:    Medical: No    Non-medical: No  Tobacco Use  . Smoking status: Never Smoker  . Smokeless tobacco: Never Used  Substance and Sexual Activity  . Alcohol use: No  . Drug use: No  . Sexual activity: Not on file  Lifestyle  . Physical activity:    Days per week: 0 days    Minutes per session: 0 min  . Stress: To some extent  Relationships  . Social connections:    Talks on phone: More than three times a week    Gets together: Once a week    Attends religious service: 1 to 4 times per year    Active member of club or organization: No    Attends meetings of clubs or organizations: Never    Relationship status: Married  . Intimate partner violence:    Fear of current or ex  partner: No    Emotionally abused: No    Physically abused: No    Forced sexual activity: No  Other Topics Concern  . Not on file  Social History Narrative  . Not on file    FAMILY HISTORY:  Family History  Problem Relation Age of Onset  . Heart disease Mother   . Colon cancer Mother   . Stroke Mother   . Alcohol abuse Father        Cause of death is Suicide  . Breast cancer Sister   . Mental illness Paternal Aunt   . Alcohol abuse Paternal Uncle     CURRENT MEDICATIONS:  Outpatient Encounter Medications as of 01/20/2018  Medication Sig  . allopurinol (ZYLOPRIM) 300 MG tablet Take 300 mg by mouth daily.  Marland Kitchen atorvastatin (LIPITOR) 10 MG tablet Take 10 mg by  mouth daily.  Marland Kitchen diltiazem (CARDIZEM CD) 240 MG 24 hr capsule TAKE 1 CAPSULE (240 MG TOTAL) BY MOUTH EVERY DAY  . flurazepam (DALMANE) 30 MG capsule Take 30 mg by mouth at bedtime.  . gabapentin (NEURONTIN) 300 MG capsule Take 300 mg by mouth 3 (three) times daily.  Marland Kitchen Ipilimumab (YERVOY IV) Inject into the vein every 21 ( twenty-one) days.   Marland Kitchen lisinopril (PRINIVIL,ZESTRIL) 40 MG tablet Take 40 mg by mouth daily.   . metFORMIN (GLUCOPHAGE) 500 MG tablet Take 500 mg by mouth daily.  . metoprolol succinate (TOPROL-XL) 50 MG 24 hr tablet TAKE 1 TABLET BY MOUTH TWICE A DAY WITH FOOD  . Multiple Vitamin (MULTIVITAMIN) tablet Take 1 tablet by mouth daily.  . Nivolumab (OPDIVO IV) Inject into the vein every 21 ( twenty-one) days.   . prochlorperazine (COMPAZINE) 10 MG tablet Take 1 tablet (10 mg total) by mouth every 6 (six) hours as needed for nausea or vomiting.  . rivaroxaban (XARELTO) 20 MG TABS tablet Take 1 tablet (20 mg total) by mouth daily with supper.   No facility-administered encounter medications on file as of 01/20/2018.     ALLERGIES:  No Known Allergies   PHYSICAL EXAM:  ECOG Performance status: 1  Vitals:   01/20/18 0934  BP: 124/64  Pulse: 98  Resp: 20  Temp: 97.9 F (36.6 C)  SpO2: 97%   Filed Weights   01/20/18 0934  Weight: 247 lb (112 kg)    Physical Exam  Constitutional: He is oriented to person, place, and time. He appears well-developed and well-nourished.  Cardiovascular: Normal rate, regular rhythm and normal heart sounds.  Pulmonary/Chest: Effort normal and breath sounds normal.  Musculoskeletal: Normal range of motion.  Neurological: He is alert and oriented to person, place, and time.  Skin: Skin is warm and dry.  Psychiatric: He has a normal mood and affect. His behavior is normal. Judgment and thought content normal.     LABORATORY DATA:  I have reviewed the labs as listed.  CBC    Component Value Date/Time   WBC 7.0 01/20/2018 0812   RBC  4.72 01/20/2018 0812   HGB 12.3 (L) 01/20/2018 0812   HCT 41.1 01/20/2018 0812   PLT 243 01/20/2018 0812   MCV 87.1 01/20/2018 0812   MCH 26.1 01/20/2018 0812   MCHC 29.9 (L) 01/20/2018 0812   RDW 16.4 (H) 01/20/2018 0812   LYMPHSABS 2.0 01/20/2018 0812   MONOABS 0.5 01/20/2018 0812   EOSABS 0.2 01/20/2018 0812   BASOSABS 0.0 01/20/2018 0812   CMP Latest Ref Rng & Units 01/20/2018 12/29/2017 12/07/2017  Glucose 70 - 99  mg/dL 163(H) 86 125(H)  BUN 8 - 23 mg/dL 12 13 10   Creatinine 0.61 - 1.24 mg/dL 1.14 1.07 1.02  Sodium 135 - 145 mmol/L 140 141 139  Potassium 3.5 - 5.1 mmol/L 3.3(L) 3.4(L) 3.4(L)  Chloride 98 - 111 mmol/L 103 104 100  CO2 22 - 32 mmol/L 27 27 29   Calcium 8.9 - 10.3 mg/dL 9.1 8.7(L) 8.9  Total Protein 6.5 - 8.1 g/dL 7.3 6.8 7.4  Total Bilirubin 0.3 - 1.2 mg/dL 0.8 0.7 1.1  Alkaline Phos 38 - 126 U/L 105 90 86  AST 15 - 41 U/L 24 21 26   ALT 0 - 44 U/L 15 19 21         ASSESSMENT & PLAN:   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  Metastatic melanoma to the lung, BRAF negative: - History of melanoma of the scalp vertex treated with Mohs surgery in November 4696, uncertain staging as no access to pathology. 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  Underwent modified most, diagnosed as a collision tumor (melanoma and carcinoma), likely metastatic.  Lymph nodes were clinically negative. -PET CT scan on 07/13/2017 shows hypermetabolic right lower lobe pulmonary nodule concerning for metastasis.  Additional pulmonary nodules of the right lung which are below resolution of the PET CT.  Focal highly hypermetabolic lesion of the descending colon, status post colonoscopy on 09/21/2017-tubulovillous, tubular and adenomatous polyps. - Underwent CT-guided right lower lobe lung biopsy on 10/05/2017-consistent with malignant  melanoma. -Notes from Dr. Baltazar Najjar indicate BRAF V600 mutation was negative.  We will obtain reports of it. - We discussed about the normal prognosis of metastatic malignant melanoma.  We talked about treatment options including single agent ipilimumab/nivolumab  with the overall objective response rate of 40 to 45% and 4-year overall survival of 42%.  We also discussed response rates of combination immunotherapy with ipilimumab (3 mg/kg) and nivolumab (1 mg/kg) every 3 weeks for 4 doses followed by maintenance nivolumab to be around 60% with 3-year overall survival approaching 70%.  Overall survival in patients without a BRAF mutation is slightly lower. -PET CT scan dated 11/22/2017  showed at least 3 hypermetabolic right lung nodules and several additional 2 small to characterize nodules in both lungs.  Hypermetabolic metastasis in the lateral segment of the left lobe of liver noted.  At least 3 hypermetabolic peritoneal nodules identified.  Multifocal bone metastasis, predominantly in the left humerus, thoracic vertebra, right ilium and right femur noted.  I have also reviewed the results of the brain MRI which did not show any obvious metastatic disease. - Combination immunotherapy with Yervoy (3 mg/kg) and Opdivo (1 mg/kg) cycle 1 on 12/08/2017 and cycle 2 on 12/29/2017. -He has tolerated combination immunotherapy very well.  He did not have any immunotherapy related side effects.  We reviewed his blood work.  They are within normal limits. -He may proceed with cycle 3 today.  I plan to repeat PET CT scan prior to next visit in 3 weeks.  2.  Atrial fibrillation: -He is on Xarelto.  3.  Bone metastasis: -He was recommended to take calcium and vitamin D. -I also talked to him about starting him on Xgeva to decrease skeletal related events.  We talked about side effects in detail. -We will start Xgeva along with monthly Opdivo.      Orders placed this encounter:  Orders Placed This  Encounter    Procedures  . NM PET Image Restag (PS) Skull Base To Thigh  . TSH  . Lactate dehydrogenase  . Magnesium  . CBC with Differential/Platelet  . Comprehensive metabolic panel      Derek Jack, MD [place] [phone]

## 2018-01-20 NOTE — Patient Instructions (Signed)
[place] at [facility] Discharge Instructions  Follow up in 3 weeks with labs and PET scan  Thank you for choosing [place] at [facility] to provide your oncology and hematology care.  To afford each patient quality time with our provider, please arrive at least 15 minutes before your scheduled appointment time.   If you have a lab appointment with the [facility] please come in thru the  Main Entrance and check in at the main information desk  You need to re-schedule your appointment should you arrive 10 or more minutes late.  We strive to give you quality time with our providers, and arriving late affects you and other patients whose appointments are after yours.  Also, if you no show three or more times for appointments you may be dismissed from the clinic at the providers discretion.     Again, thank you for choosing [facility].  Our hope is that these requests will decrease the amount of time that you wait before being seen by our physicians.       _____________________________________________________________  Should you have questions after your visit to [facility], please contact our office at [phone] between the hours of 8:00 a.m. and 4:30 p.m.  Voicemails left after 4:00 p.m. will not be returned until the following business day.  For prescription refill requests, have your pharmacy contact our office and allow 72 hours.    Cancer Center Support Programs:   > Cancer Support Group  2nd Tuesday of the month 1pm-2pm, Journey Room

## 2018-01-23 ENCOUNTER — Ambulatory Visit (HOSPITAL_COMMUNITY): Payer: Medicare Other | Admitting: Hematology

## 2018-02-06 ENCOUNTER — Ambulatory Visit (HOSPITAL_COMMUNITY)
Admission: RE | Admit: 2018-02-06 | Discharge: 2018-02-06 | Disposition: A | Discharge status: Home / Self Care | Payer: Medicare Other | Source: Ambulatory Visit | Attending: Nurse Practitioner | Admitting: Nurse Practitioner

## 2018-02-06 DIAGNOSIS — C7801 Secondary malignant neoplasm of right lung: Secondary | ICD-10-CM | POA: Diagnosis not present

## 2018-02-06 MED ORDER — FLUDEOXYGLUCOSE F - 18 (FDG) INJECTION
13.7500 | Freq: Once | INTRAVENOUS | Status: AC | PRN
  Administered 2018-02-06: 13.75 via INTRAVENOUS

## 2018-02-15 ENCOUNTER — Inpatient Hospital Stay (HOSPITAL_BASED_OUTPATIENT_CLINIC_OR_DEPARTMENT_OTHER): Payer: Medicare Other | Admitting: Hematology

## 2018-02-15 ENCOUNTER — Other Ambulatory Visit: Payer: Self-pay

## 2018-02-15 ENCOUNTER — Encounter (HOSPITAL_COMMUNITY): Payer: Self-pay | Admitting: Hematology

## 2018-02-15 ENCOUNTER — Inpatient Hospital Stay (HOSPITAL_COMMUNITY): Payer: Medicare Other | Attending: Hematology

## 2018-02-15 ENCOUNTER — Encounter (HOSPITAL_COMMUNITY): Payer: Self-pay

## 2018-02-15 ENCOUNTER — Inpatient Hospital Stay (HOSPITAL_COMMUNITY): Payer: Medicare Other

## 2018-02-15 VITALS — BP 125/67 | HR 59 | Temp 97.7°F | Resp 18

## 2018-02-15 VITALS — BP 129/75 | HR 59 | Temp 97.7°F | Resp 20 | Wt 249.0 lb

## 2018-02-15 DIAGNOSIS — Z79899 Other long term (current) drug therapy: Secondary | ICD-10-CM

## 2018-02-15 DIAGNOSIS — C434 Malignant melanoma of scalp and neck: Secondary | ICD-10-CM | POA: Insufficient documentation

## 2018-02-15 DIAGNOSIS — R197 Diarrhea, unspecified: Secondary | ICD-10-CM | POA: Diagnosis not present

## 2018-02-15 DIAGNOSIS — Z7901 Long term (current) use of anticoagulants: Secondary | ICD-10-CM | POA: Diagnosis not present

## 2018-02-15 DIAGNOSIS — C7951 Secondary malignant neoplasm of bone: Secondary | ICD-10-CM | POA: Diagnosis not present

## 2018-02-15 DIAGNOSIS — C787 Secondary malignant neoplasm of liver and intrahepatic bile duct: Secondary | ICD-10-CM | POA: Diagnosis not present

## 2018-02-15 DIAGNOSIS — Z7984 Long term (current) use of oral hypoglycemic drugs: Secondary | ICD-10-CM | POA: Insufficient documentation

## 2018-02-15 DIAGNOSIS — I4891 Unspecified atrial fibrillation: Secondary | ICD-10-CM | POA: Insufficient documentation

## 2018-02-15 DIAGNOSIS — C7801 Secondary malignant neoplasm of right lung: Secondary | ICD-10-CM

## 2018-02-15 DIAGNOSIS — I10 Essential (primary) hypertension: Secondary | ICD-10-CM

## 2018-02-15 DIAGNOSIS — Z5112 Encounter for antineoplastic immunotherapy: Secondary | ICD-10-CM | POA: Insufficient documentation

## 2018-02-15 DIAGNOSIS — E119 Type 2 diabetes mellitus without complications: Secondary | ICD-10-CM | POA: Insufficient documentation

## 2018-02-15 LAB — CBC WITH DIFFERENTIAL/PLATELET
Abs Immature Granulocytes: 0.03 10*3/uL (ref 0.00–0.07)
BASOS ABS: 0 10*3/uL (ref 0.0–0.1)
Basophils Relative: 1 %
Eosinophils Absolute: 0.2 10*3/uL (ref 0.0–0.5)
Eosinophils Relative: 4 %
HCT: 40.1 % (ref 39.0–52.0)
Hemoglobin: 12.4 g/dL — ABNORMAL LOW (ref 13.0–17.0)
IMMATURE GRANULOCYTES: 1 %
Lymphocytes Relative: 30 %
Lymphs Abs: 1.9 10*3/uL (ref 0.7–4.0)
MCH: 27 pg (ref 26.0–34.0)
MCHC: 30.9 g/dL (ref 30.0–36.0)
MCV: 87.2 fL (ref 80.0–100.0)
Monocytes Absolute: 0.6 10*3/uL (ref 0.1–1.0)
Monocytes Relative: 9 %
NEUTROS ABS: 3.6 10*3/uL (ref 1.7–7.7)
Neutrophils Relative %: 55 %
Platelets: 241 10*3/uL (ref 150–400)
RBC: 4.6 MIL/uL (ref 4.22–5.81)
RDW: 16.5 % — ABNORMAL HIGH (ref 11.5–15.5)
WBC: 6.4 10*3/uL (ref 4.0–10.5)
nRBC: 0 % (ref 0.0–0.2)

## 2018-02-15 LAB — LACTATE DEHYDROGENASE: LDH: 153 U/L (ref 98–192)

## 2018-02-15 LAB — COMPREHENSIVE METABOLIC PANEL
ALBUMIN: 3.5 g/dL (ref 3.5–5.0)
ALT: 12 U/L (ref 0–44)
AST: 20 U/L (ref 15–41)
Alkaline Phosphatase: 96 U/L (ref 38–126)
Anion gap: 8 (ref 5–15)
BUN: 8 mg/dL (ref 8–23)
CO2: 29 mmol/L (ref 22–32)
Calcium: 8.7 mg/dL — ABNORMAL LOW (ref 8.9–10.3)
Chloride: 103 mmol/L (ref 98–111)
Creatinine, Ser: 1.06 mg/dL (ref 0.61–1.24)
GFR calc Af Amer: >60 mL/min (ref >60)
GFR calc non Af Amer: >60 mL/min (ref >60)
GLUCOSE: 130 mg/dL — AB (ref 70–99)
Potassium: 3.1 mmol/L — ABNORMAL LOW (ref 3.5–5.1)
Sodium: 140 mmol/L (ref 135–145)
Total Bilirubin: 1 mg/dL (ref 0.3–1.2)
Total Protein: 7.2 g/dL (ref 6.5–8.1)

## 2018-02-15 LAB — TSH: TSH: 1.278 u[IU]/mL (ref 0.350–4.500)

## 2018-02-15 LAB — MAGNESIUM: Magnesium: 1.5 mg/dL — ABNORMAL LOW (ref 1.7–2.4)

## 2018-02-15 MED ORDER — FAMOTIDINE IN NACL 20-0.9 MG/50ML-% IV SOLN
20.0000 mg | Freq: Once | INTRAVENOUS | Status: AC
  Administered 2018-02-15: 20 mg via INTRAVENOUS
  Filled 2018-02-15: qty 50

## 2018-02-15 MED ORDER — MAGNESIUM OXIDE 400 (241.3 MG) MG PO TABS: 400.0000 mg | ORAL_TABLET | Freq: Two times a day (BID) | ORAL | 1 refills | Status: DC

## 2018-02-15 MED ORDER — SODIUM CHLORIDE 0.9 % IV SOLN
100.0000 mg | Freq: Once | INTRAVENOUS | Status: AC
  Administered 2018-02-15: 100 mg via INTRAVENOUS
  Filled 2018-02-15: qty 10

## 2018-02-15 MED ORDER — SODIUM CHLORIDE 0.9 % IV SOLN
3.0000 mg/kg | Freq: Once | INTRAVENOUS | Status: AC
  Administered 2018-02-15: 350 mg via INTRAVENOUS
  Filled 2018-02-15: qty 40

## 2018-02-15 MED ORDER — SODIUM CHLORIDE 0.9% FLUSH
10.0000 mL | INTRAVENOUS | Status: DC | PRN
  Administered 2018-02-15: 10 mL
  Filled 2018-02-15: qty 10

## 2018-02-15 MED ORDER — SODIUM CHLORIDE 0.9 % IV SOLN
Freq: Once | INTRAVENOUS | Status: AC
  Administered 2018-02-15: 12:00:00 via INTRAVENOUS

## 2018-02-15 MED ORDER — DIPHENHYDRAMINE HCL 50 MG/ML IJ SOLN
25.0000 mg | Freq: Once | INTRAMUSCULAR | Status: AC
  Administered 2018-02-15: 25 mg via INTRAVENOUS
  Filled 2018-02-15: qty 1

## 2018-02-15 MED ORDER — HEPARIN SOD (PORK) LOCK FLUSH 100 UNIT/ML IV SOLN: 500.0000 [IU] | Freq: Once | INTRAVENOUS | Status: DC | PRN

## 2018-02-15 MED ORDER — POTASSIUM CHLORIDE CRYS ER 20 MEQ PO TBCR: 20.0000 meq | EXTENDED_RELEASE_TABLET | Freq: Every day | ORAL | 1 refills | Status: DC

## 2018-02-15 NOTE — Assessment & Plan Note (Signed)
1.  Metastatic melanoma to the lung, BRAF negative: - History of melanoma of the scalp vertex treated with Mohs surgery in November 0277, uncertain staging as no access to pathology. 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  Underwent modified most, diagnosed as a collision tumor (melanoma and carcinoma), likely metastatic.  Lymph nodes were clinically negative. -PET CT scan on 07/13/2017 shows hypermetabolic right lower lobe pulmonary nodule concerning for metastasis.  Additional pulmonary nodules of the right lung which are below resolution of the PET CT.  Focal highly hypermetabolic lesion of the descending colon, status post colonoscopy on 09/21/2017-tubulovillous, tubular and adenomatous polyps. - Underwent CT-guided right lower lobe lung biopsy on 10/05/2017-consistent with malignant melanoma. -Notes from Dr. Baltazar Najjar indicate BRAF V600 mutation was negative.  We will obtain reports of it. - We discussed about the normal prognosis of metastatic malignant melanoma.  We talked about treatment options including single agent ipilimumab/nivolumab  with the overall objective response rate of 40 to 45% and 4-year overall survival of 42%.  We also discussed response rates of combination immunotherapy with ipilimumab (3 mg/kg) and nivolumab (1 mg/kg) every 3 weeks for 4 doses followed by maintenance nivolumab to be around 60% with 3-year overall survival approaching 70%.  Overall survival in patients without a BRAF mutation is slightly lower. -PET CT scan dated 11/22/2017  showed at least 3 hypermetabolic right lung nodules and several additional 2 small to characterize nodules in both lungs.  Hypermetabolic metastasis in the lateral segment of the left lobe of liver noted.  At least 3 hypermetabolic peritoneal nodules identified.  Multifocal bone metastasis,  predominantly in the left humerus, thoracic vertebra, right ilium and right femur noted.  I have also reviewed the results of the brain MRI which did not show any obvious metastatic disease. - Combination immunotherapy with Yervoy (3 mg/kg) and Opdivo (1 mg/kg) cycle 1 on 12/08/2017 and cycle 2 on 12/29/2017. - He received cycle 3 on 01/20/2018.  He did not have any major side effects and none of the immunotherapy related side effects.  He had mild rash on the left leg which has improved. - We discussed the PET CT scan dated 02/06/2018 which showed marked improvement in the bilateral pulmonary nodules with decrease in size and no residual hypermetabolism.  Left liver nodule and left iliopsoas nodule have resolved.  Peritoneal nodules have also completely resolved.  Bone metastases also look better.  Anterior right subcutaneous nodule shows slight increase in size and the increase in hypermetabolic some. -Overall this is a very good response.  He may proceed with cycle 4 of combination immunotherapy today. - We will start him on single agent nivolumab 40 mg every 4 weeks after this cycle in 3 weeks. -I will see him back in 7 weeks for follow-up.  I plan to repeat an of the PET CT scan in 3 months. -he has hypomagnesemia with a level of 1.5 today.  He will be started on magnesium 400 mEq twice daily.  His potassium is 3.1.  It was also low last visit.  Will start him on potassium 20 mg daily.  2.  Atrial fibrillation: -He is on Xarelto.  3.  Bone metastasis: -He was recommended to take calcium and vitamin D. -I also talked to him about starting him on Xgeva to decrease skeletal related events.  We talked about side effects in detail. -We  will start Xgeva along with monthly Opdivo.

## 2018-02-15 NOTE — Progress Notes (Signed)
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       INTERVAL HISTORY:  Christopher Baird 73 y.o. male returns for routine follow-up for metastatic melanoma. He is here today with his wife. He is tolerating treatment very well. He had a mild rash after his last treatment. It was itching and used a cream he got OTC and it went away fast. He has occasional diarrhea but not everyday. He denies any nausea or vomiting. Denies any numbness or tingling in his hands or feet. Denies any fevers or recent infections. Denies any bleeding or easy bruising. He reports his appetite at 100% and he has no problem maintaining his weight. His energy level is 50%.    REVIEW OF SYSTEMS:  Review of Systems  Gastrointestinal: Positive for diarrhea.  Psychiatric/Behavioral: Positive for sleep disturbance.  All other systems reviewed and are negative.    PAST MEDICAL/SURGICAL HISTORY:  Past Medical History:  Diagnosis Date  . Arthritis, degenerative   . Bone metastases ([place])  12/29/2017  . Calculus of kidney   . Gout, unspecified   . Skin cancer   . Unspecified essential hypertension    Past Surgical History:  Procedure Laterality Date  . None       SOCIAL HISTORY:  Social History   Socioeconomic History  . Marital status: Married    Spouse name: Not on file  . Number of children: 1  . Years of education: Not on file  . Highest education level: Not on file  Occupational History  . Occupation: Retired    Comment: Chief Financial Officer  Social Needs  . Financial resource strain: Not hard at all  . Food insecurity:    Worry: Never true    Inability: Never true  . Transportation needs:    Medical: No    Non-medical: No  Tobacco Use  . Smoking status: Never Smoker  . Smokeless tobacco: Never Used  Substance and Sexual Activity  . Alcohol use: No  . Drug use: No  . Sexual activity: Not on file  Lifestyle  . Physical activity:    Days per week: 0 days    Minutes per session: 0 min  . Stress: To some extent  Relationships  . Social connections:    Talks on phone: More than three times a week    Gets together: Once a week    Attends religious service: 1 to 4 times per year    Active member of club or organization: No    Attends meetings of  clubs or organizations: Never    Relationship status: Married  . Intimate partner violence:    Fear of current or ex partner: No    Emotionally abused: No    Physically abused: No    Forced sexual activity: No  Other Topics Concern  . Not on file  Social History Narrative  . Not on file    FAMILY HISTORY:  Family History  Problem Relation Age of Onset  . Heart disease Mother   . Colon cancer Mother   . Stroke Mother   . Alcohol abuse Father        Cause of death is Suicide  . Breast cancer Sister   . Mental illness Paternal Aunt   . Alcohol abuse Paternal Uncle     CURRENT MEDICATIONS:  Outpatient Encounter Medications as of 02/15/2018  Medication Sig  . allopurinol (ZYLOPRIM)  300 MG tablet Take 300 mg by mouth daily.  Marland Kitchen atorvastatin (LIPITOR) 10 MG tablet Take 10 mg by mouth daily.  Marland Kitchen diltiazem (CARDIZEM CD) 240 MG 24 hr capsule TAKE 1 CAPSULE (240 MG TOTAL) BY MOUTH EVERY DAY  . flurazepam (DALMANE) 30 MG capsule Take 30 mg by mouth at bedtime.  . gabapentin (NEURONTIN) 300 MG capsule Take 300 mg by mouth 3 (three) times daily.  Marland Kitchen Ipilimumab (YERVOY IV) Inject into the vein every 21 ( twenty-one) days.   Marland Kitchen lisinopril (PRINIVIL,ZESTRIL) 40 MG tablet Take 40 mg by mouth daily.   . metFORMIN (GLUCOPHAGE) 500 MG tablet Take 500 mg by mouth daily.  . metoprolol succinate (TOPROL-XL) 50 MG 24 hr tablet TAKE 1 TABLET BY MOUTH TWICE A DAY WITH FOOD  . Multiple Vitamin (MULTIVITAMIN) tablet Take 1 tablet by mouth daily.  . Nivolumab (OPDIVO IV) Inject into the vein every 21 ( twenty-one) days.   . prochlorperazine (COMPAZINE) 10 MG tablet Take 1 tablet (10 mg total) by mouth every 6 (six) hours as needed for nausea or vomiting.  . rivaroxaban (XARELTO) 20 MG TABS tablet Take 1 tablet (20 mg total) by mouth daily with supper.  . magnesium oxide (MAG-OX) 400 (241.3 Mg) MG tablet Take 1 tablet (400 mg total) by mouth 2 (two) times daily.  . potassium chloride SA (K-DUR,KLOR-CON) 20 MEQ tablet Take 1 tablet (20 mEq total) by mouth daily.   No facility-administered encounter medications on file as of 02/15/2018.     ALLERGIES:  No Known Allergies   PHYSICAL EXAM:  ECOG Performance status: 1  Vitals:   02/15/18 1035  BP: 129/75  Pulse: (!) 59  Resp: 20  Temp: 97.7 F (36.5 C)  SpO2: 98%   Filed Weights   02/15/18 1035  Weight: 249 lb (112.9 kg)    Physical Exam  Constitutional: He is oriented to person, place, and time. He appears well-developed and well-nourished.  Musculoskeletal: Normal range of motion.  Neurological: He is alert and oriented to person, place, and time.  Skin: Skin is warm and dry.  Psychiatric: He has a normal mood and affect. His  behavior is normal. Judgment and thought content normal.     LABORATORY DATA:  I have reviewed the labs as listed.  CBC    Component Value Date/Time   WBC 6.4 02/15/2018 1002   RBC 4.60 02/15/2018 1002   HGB 12.4 (L) 02/15/2018 1002   HCT 40.1 02/15/2018 1002   PLT 241 02/15/2018 1002   MCV 87.2 02/15/2018 1002   MCH 27.0 02/15/2018 1002   MCHC 30.9 02/15/2018 1002  RDW 16.5 (H) 02/15/2018 1002   LYMPHSABS 1.9 02/15/2018 1002   MONOABS 0.6 02/15/2018 1002   EOSABS 0.2 02/15/2018 1002   BASOSABS 0.0 02/15/2018 1002   CMP Latest Ref Rng & Units 02/15/2018 01/20/2018 12/29/2017  Glucose 70 - 99 mg/dL 130(H) 163(H) 86  BUN 8 - 23 mg/dL 8 12 13   Creatinine 0.61 - 1.24 mg/dL 1.06 1.14 1.07  Sodium 135 - 145 mmol/L 140 140 141  Potassium 3.5 - 5.1 mmol/L 3.1(L) 3.3(L) 3.4(L)  Chloride 98 - 111 mmol/L 103 103 104  CO2 22 - 32 mmol/L 29 27 27   Calcium 8.9 - 10.3 mg/dL 8.7(L) 9.1 8.7(L)  Total Protein 6.5 - 8.1 g/dL 7.2 7.3 6.8  Total Bilirubin 0.3 - 1.2 mg/dL 1.0 0.8 0.7  Alkaline Phos 38 - 126 U/L 96 105 90  AST 15 - 41 U/L 20 24 21   ALT 0 - 44 U/L 12 15 19        DIAGNOSTIC IMAGING:  I have independently reviewed the scans and discussed with the patient.   I have reviewed Francene Finders, NP's note and agree with the documentation.  I personally performed a face-to-face visit, made revisions and my assessment and plan is as follows.     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  Underwent modified most, diagnosed as a collision tumor (melanoma and carcinoma), likely metastatic.  Lymph  nodes were clinically negative. -PET CT scan on 07/13/2017 shows hypermetabolic right lower lobe pulmonary nodule concerning for metastasis.  Additional pulmonary nodules of the right lung which are below resolution of the PET CT.  Focal highly hypermetabolic lesion of the descending colon, status post colonoscopy on 09/21/2017-tubulovillous, tubular and adenomatous polyps. - Underwent CT-guided right lower lobe lung biopsy on 10/05/2017-consistent with malignant melanoma. -Notes from Dr. Baltazar Najjar indicate BRAF V600 mutation was negative.  We will obtain reports of it. - We discussed about the normal prognosis of metastatic malignant melanoma.  We talked about treatment options including single agent ipilimumab/nivolumab  with the overall objective response rate of 40 to 45% and 4-year overall survival of 42%.  We also discussed response rates of combination immunotherapy with ipilimumab (3 mg/kg) and nivolumab (1 mg/kg) every 3 weeks for 4 doses followed by maintenance nivolumab to be around 60% with 3-year overall survival approaching 70%.  Overall survival in patients without a BRAF mutation is slightly lower. -PET CT scan dated 11/22/2017  showed at least 3 hypermetabolic right lung nodules and several additional 2 small to characterize nodules in both lungs.  Hypermetabolic metastasis in the lateral segment of the left lobe of liver noted.  At least 3 hypermetabolic peritoneal nodules identified.  Multifocal bone metastasis, predominantly in the left humerus, thoracic vertebra, right ilium and right femur noted.  I have also reviewed the results of the brain MRI which did not show any obvious metastatic disease. - Combination immunotherapy with Yervoy (3 mg/kg) and Opdivo (1 mg/kg) cycle 1 on 12/08/2017 and cycle 2 on 12/29/2017. - He received cycle 3 on 01/20/2018.  He did not have any major side effects and none of the immunotherapy related side effects.  He had mild rash on the left leg which has improved. -  We discussed the PET CT scan dated 02/06/2018 which showed marked improvement in the bilateral pulmonary nodules with decrease in size and no residual hypermetabolism.  Left liver nodule and left iliopsoas nodule have resolved.  Peritoneal nodules have also completely resolved.  Bone metastases also look better.  Anterior right subcutaneous nodule shows slight increase in size and the increase in hypermetabolic some. -Overall this is a very good response.  He may proceed with cycle 4 of combination immunotherapy today. - We will start him on single agent nivolumab 40 mg every 4 weeks after this cycle in 3 weeks. -I will see him back in 7 weeks for follow-up.  I plan to repeat an of the PET CT scan in 3 months. -he has hypomagnesemia with a level of 1.5 today.  He will be started on magnesium 400 mEq twice daily.  His potassium is 3.1.  It was also low last visit.  Will start him on potassium 20 mg daily.  2.  Atrial fibrillation: -He is on Xarelto.  3.  Bone metastasis: -He was recommended to take calcium and vitamin D. -I also talked to him about starting him on Xgeva to decrease skeletal related events.  We talked about side effects in detail. -We will start Xgeva along with monthly Opdivo.      Orders placed this encounter:  Orders Placed This Encounter  Procedures  . TSH  . Lactate dehydrogenase  . Magnesium  . CBC with Differential/Platelet  . Comprehensive metabolic panel  . CBC with Differential/Platelet  . Comprehensive metabolic panel      Derek Jack, MD [place] [phone]

## 2018-02-15 NOTE — Patient Instructions (Signed)
[facility] Discharge Instructions for Patients Receiving Chemotherapy  Today you received the following chemotherapy agents   To help prevent nausea and vomiting after your treatment, we encourage you to take your nausea medication   If you develop nausea and vomiting that is not controlled by your nausea medication, call the clinic.   BELOW ARE SYMPTOMS THAT SHOULD BE REPORTED IMMEDIATELY:  *FEVER GREATER THAN 100.5 F  *CHILLS WITH OR WITHOUT FEVER  NAUSEA AND VOMITING THAT IS NOT CONTROLLED WITH YOUR NAUSEA MEDICATION  *UNUSUAL SHORTNESS OF BREATH  *UNUSUAL BRUISING OR BLEEDING  TENDERNESS IN MOUTH AND THROAT WITH OR WITHOUT PRESENCE OF ULCERS  *URINARY PROBLEMS  *BOWEL PROBLEMS  UNUSUAL RASH Items with * indicate a potential emergency and should be followed up as soon as possible.  Feel free to call the clinic should you have any questions or concerns. The clinic phone number is [phone].  Please show the CHEMO ALERT CARD at check-in to the Emergency Department and triage nurse.   

## 2018-02-15 NOTE — Patient Instructions (Signed)
[place] at [facility] Discharge Instructions  Follow up with [place] in 7 weeks with labs.    Thank you for choosing [place] at [facility] to provide your oncology and hematology care.  To afford each patient quality time with our provider, please arrive at least 15 minutes before your scheduled appointment time.   If you have a lab appointment with the [facility] please come in thru the  Main Entrance and check in at the main information desk  You need to re-schedule your appointment should you arrive 10 or more minutes late.  We strive to give you quality time with our providers, and arriving late affects you and other patients whose appointments are after yours.  Also, if you no show three or more times for appointments you may be dismissed from the clinic at the providers discretion.     Again, thank you for choosing [facility].  Our hope is that these requests will decrease the amount of time that you wait before being seen by our physicians.       _____________________________________________________________  Should you have questions after your visit to [facility], please contact our office at [phone] between the hours of 8:00 a.m. and 4:30 p.m.  Voicemails left after 4:00 p.m. will not be returned until the following business day.  For prescription refill requests, have your pharmacy contact our office and allow 72 hours.    Cancer Center Support Programs:   > Cancer Support Group  2nd Tuesday of the month 1pm-2pm, Journey Room

## 2018-02-15 NOTE — Progress Notes (Signed)
Pt here today for Opdivo/ Yervoy. Labs reviewed. VSS. No complaints of any changes since last visit. Per Dr. Delton Coombes proceed with treatment.   Treatment given today per MD orders. Tolerated infusion without adverse affects. Vital signs stable. No complaints at this time. Discharged from clinic ambulatory. F/U with [facility] as scheduled.

## 2018-02-20 ENCOUNTER — Other Ambulatory Visit: Payer: Self-pay | Admitting: Cardiology

## 2018-03-07 ENCOUNTER — Other Ambulatory Visit (HOSPITAL_COMMUNITY): Payer: Self-pay | Admitting: Hematology

## 2018-03-10 ENCOUNTER — Inpatient Hospital Stay (HOSPITAL_COMMUNITY): Payer: Medicare Other

## 2018-03-10 ENCOUNTER — Inpatient Hospital Stay (HOSPITAL_COMMUNITY): Payer: Medicare Other | Attending: Internal Medicine

## 2018-03-10 VITALS — BP 122/60 | HR 75 | Temp 98.3°F | Resp 18 | Wt 243.6 lb

## 2018-03-10 DIAGNOSIS — C7951 Secondary malignant neoplasm of bone: Secondary | ICD-10-CM | POA: Diagnosis not present

## 2018-03-10 DIAGNOSIS — C7801 Secondary malignant neoplasm of right lung: Secondary | ICD-10-CM

## 2018-03-10 DIAGNOSIS — Z5112 Encounter for antineoplastic immunotherapy: Secondary | ICD-10-CM | POA: Diagnosis present

## 2018-03-10 DIAGNOSIS — C434 Malignant melanoma of scalp and neck: Secondary | ICD-10-CM | POA: Diagnosis present

## 2018-03-10 LAB — CBC WITH DIFFERENTIAL/PLATELET
ABS IMMATURE GRANULOCYTES: 0.03 10*3/uL (ref 0.00–0.07)
BASOS PCT: 1 %
Basophils Absolute: 0.1 10*3/uL (ref 0.0–0.1)
Eosinophils Absolute: 0.3 10*3/uL (ref 0.0–0.5)
Eosinophils Relative: 3 %
HCT: 44.1 % (ref 39.0–52.0)
Hemoglobin: 13.4 g/dL (ref 13.0–17.0)
Immature Granulocytes: 0 %
Lymphocytes Relative: 31 %
Lymphs Abs: 2.7 10*3/uL (ref 0.7–4.0)
MCH: 27 pg (ref 26.0–34.0)
MCHC: 30.4 g/dL (ref 30.0–36.0)
MCV: 88.7 fL (ref 80.0–100.0)
MONOS PCT: 10 %
Monocytes Absolute: 0.9 10*3/uL (ref 0.1–1.0)
NEUTROS ABS: 4.9 10*3/uL (ref 1.7–7.7)
Neutrophils Relative %: 55 %
Platelets: 292 10*3/uL (ref 150–400)
RBC: 4.97 MIL/uL (ref 4.22–5.81)
RDW: 16 % — ABNORMAL HIGH (ref 11.5–15.5)
WBC: 8.8 10*3/uL (ref 4.0–10.5)
nRBC: 0 % (ref 0.0–0.2)

## 2018-03-10 LAB — COMPREHENSIVE METABOLIC PANEL
ALT: 11 U/L (ref 0–44)
AST: 19 U/L (ref 15–41)
Albumin: 3.9 g/dL (ref 3.5–5.0)
Alkaline Phosphatase: 94 U/L (ref 38–126)
Anion gap: 9 (ref 5–15)
BUN: 12 mg/dL (ref 8–23)
CHLORIDE: 105 mmol/L (ref 98–111)
CO2: 24 mmol/L (ref 22–32)
CREATININE: 1.16 mg/dL (ref 0.61–1.24)
Calcium: 9.4 mg/dL (ref 8.9–10.3)
GFR calc Af Amer: >60 mL/min (ref >60)
GFR calc non Af Amer: >60 mL/min (ref >60)
Glucose, Bld: 120 mg/dL — ABNORMAL HIGH (ref 70–99)
Potassium: 5.1 mmol/L (ref 3.5–5.1)
Sodium: 138 mmol/L (ref 135–145)
Total Bilirubin: 1 mg/dL (ref 0.3–1.2)
Total Protein: 7.6 g/dL (ref 6.5–8.1)

## 2018-03-10 MED ORDER — SODIUM CHLORIDE 0.9 % IV SOLN
480.0000 mg | Freq: Once | INTRAVENOUS | Status: AC
  Administered 2018-03-10: 480 mg via INTRAVENOUS
  Filled 2018-03-10: qty 48

## 2018-03-10 MED ORDER — DENOSUMAB 120 MG/1.7ML ~~LOC~~ SOLN
120.0000 mg | Freq: Once | SUBCUTANEOUS | Status: AC
  Administered 2018-03-10: 120 mg via SUBCUTANEOUS
  Filled 2018-03-10: qty 1.7

## 2018-03-10 MED ORDER — SODIUM CHLORIDE 0.9 % IV SOLN
Freq: Once | INTRAVENOUS | Status: AC
  Administered 2018-03-10: 14:00:00 via INTRAVENOUS

## 2018-03-10 NOTE — Progress Notes (Signed)
[place] work reviewed and patient approved for [place] treatment today. Pt states he will start taking his Calcium and Vit D supplements as directed this evening. He reports no recent or future dental work, no jaw pain. Ca today 9.4. Education on [place] given to patient, verbally and written. Consent obtained.  Elta [place] tolerated Opdivo infusion and Xgeva injection without incident or complaint. Peripheral IV positive for blood return before, during, and after infusion. Verified by 2 RNs. VSS upon completion of treatment. Discharged self ambulatory in satisfactory condition.

## 2018-03-10 NOTE — Patient Instructions (Signed)
[facility] Discharge Instructions for Patients Receiving Chemotherapy   Beginning January 23rd 2017 lab work for the [facility] will be done in the  Main lab at [facility] on 1st floor. If you have a lab appointment with the [place] please come in thru the  Main Entrance and check in at the main information desk   Today you received the following chemotherapy agents Opdivo and Xgeva. Take your Calcium and Vit D supplements as directed. Report any planned dental work or jaw/tooth pain.  To help prevent nausea and vomiting after your treatment, we encourage you to take your nausea medication   If you develop nausea and vomiting, or diarrhea that is not controlled by your medication, call the clinic.  The clinic phone number is [phone]. Office hours are Monday-Friday 8:30am-5:00pm.  BELOW ARE SYMPTOMS THAT SHOULD BE REPORTED IMMEDIATELY:  *FEVER GREATER THAN 101.0 F  *CHILLS WITH OR WITHOUT FEVER  NAUSEA AND VOMITING THAT IS NOT CONTROLLED WITH YOUR NAUSEA MEDICATION  *UNUSUAL SHORTNESS OF BREATH  *UNUSUAL BRUISING OR BLEEDING  TENDERNESS IN MOUTH AND THROAT WITH OR WITHOUT PRESENCE OF ULCERS  *URINARY PROBLEMS  *BOWEL PROBLEMS  UNUSUAL RASH Items with * indicate a potential emergency and should be followed up as soon as possible. If you have an emergency after office hours please contact your primary care physician or go to the nearest emergency department.  Please call the clinic during office hours if you have any questions or concerns.   You may also contact the Patient Navigator at [phone] should you have any questions or need assistance in obtaining follow up care.      Resources For Cancer Patients and their Caregivers ? American Cancer Society: Can assist with transportation, wigs, general needs, runs Look Good Feel Better.        306-015-3353 ? Cancer Care: Provides financial assistance, online support groups,  medication/co-pay assistance.  1-800-813-HOPE 412-064-2269) ? [place] Assists [place] cancer patients and their families through emotional , educational and financial support.  (207)665-0464 ? [place] DSS Where to apply for food stamps, Medicaid and utility assistance. 7187033117 ? [facility]: Transportation to medical appointments. 907 659 2825 ? Social Security Administration: May apply for disability if have a Stage IV cancer. 516-545-6181 (929)630-0109 ? [employer], Disability and Transit Services: Assists with nutrition, care and transit needs. ([phone]

## 2018-03-14 ENCOUNTER — Other Ambulatory Visit (HOSPITAL_COMMUNITY): Payer: Self-pay | Admitting: Nurse Practitioner

## 2018-03-14 DIAGNOSIS — C7801 Secondary malignant neoplasm of right lung: Secondary | ICD-10-CM

## 2018-03-15 ENCOUNTER — Other Ambulatory Visit (HOSPITAL_COMMUNITY): Payer: Self-pay | Admitting: Nurse Practitioner

## 2018-03-15 DIAGNOSIS — C7801 Secondary malignant neoplasm of right lung: Secondary | ICD-10-CM

## 2018-03-31 ENCOUNTER — Ambulatory Visit (HOSPITAL_COMMUNITY): Payer: Medicare Other

## 2018-03-31 ENCOUNTER — Other Ambulatory Visit (HOSPITAL_COMMUNITY): Payer: Medicare Other

## 2018-03-31 ENCOUNTER — Ambulatory Visit (HOSPITAL_COMMUNITY): Payer: Medicare Other | Admitting: Hematology

## 2018-04-07 ENCOUNTER — Encounter (HOSPITAL_COMMUNITY): Payer: Self-pay | Admitting: Hematology

## 2018-04-07 ENCOUNTER — Inpatient Hospital Stay (HOSPITAL_BASED_OUTPATIENT_CLINIC_OR_DEPARTMENT_OTHER): Payer: Medicare Other | Admitting: Hematology

## 2018-04-07 ENCOUNTER — Encounter (HOSPITAL_COMMUNITY): Payer: Self-pay

## 2018-04-07 ENCOUNTER — Inpatient Hospital Stay (HOSPITAL_COMMUNITY): Payer: Medicare Other | Attending: Hematology

## 2018-04-07 ENCOUNTER — Inpatient Hospital Stay (HOSPITAL_COMMUNITY): Payer: Medicare Other

## 2018-04-07 VITALS — BP 138/67 | HR 87 | Temp 97.7°F | Resp 18 | Wt 237.0 lb

## 2018-04-07 VITALS — BP 119/68 | HR 59 | Temp 97.7°F | Resp 18

## 2018-04-07 DIAGNOSIS — C7801 Secondary malignant neoplasm of right lung: Secondary | ICD-10-CM

## 2018-04-07 DIAGNOSIS — C7951 Secondary malignant neoplasm of bone: Secondary | ICD-10-CM | POA: Diagnosis not present

## 2018-04-07 DIAGNOSIS — Z7901 Long term (current) use of anticoagulants: Secondary | ICD-10-CM | POA: Insufficient documentation

## 2018-04-07 DIAGNOSIS — Z8249 Family history of ischemic heart disease and other diseases of the circulatory system: Secondary | ICD-10-CM | POA: Diagnosis not present

## 2018-04-07 DIAGNOSIS — Z7984 Long term (current) use of oral hypoglycemic drugs: Secondary | ICD-10-CM

## 2018-04-07 DIAGNOSIS — E119 Type 2 diabetes mellitus without complications: Secondary | ICD-10-CM | POA: Diagnosis not present

## 2018-04-07 DIAGNOSIS — C3491 Malignant neoplasm of unspecified part of right bronchus or lung: Secondary | ICD-10-CM | POA: Diagnosis not present

## 2018-04-07 DIAGNOSIS — R0602 Shortness of breath: Secondary | ICD-10-CM

## 2018-04-07 DIAGNOSIS — R531 Weakness: Secondary | ICD-10-CM

## 2018-04-07 DIAGNOSIS — C787 Secondary malignant neoplasm of liver and intrahepatic bile duct: Secondary | ICD-10-CM

## 2018-04-07 DIAGNOSIS — I10 Essential (primary) hypertension: Secondary | ICD-10-CM

## 2018-04-07 DIAGNOSIS — Z79899 Other long term (current) drug therapy: Secondary | ICD-10-CM

## 2018-04-07 DIAGNOSIS — I4891 Unspecified atrial fibrillation: Secondary | ICD-10-CM | POA: Diagnosis not present

## 2018-04-07 DIAGNOSIS — Z85828 Personal history of other malignant neoplasm of skin: Secondary | ICD-10-CM | POA: Diagnosis not present

## 2018-04-07 DIAGNOSIS — Z5112 Encounter for antineoplastic immunotherapy: Secondary | ICD-10-CM | POA: Insufficient documentation

## 2018-04-07 DIAGNOSIS — R197 Diarrhea, unspecified: Secondary | ICD-10-CM | POA: Diagnosis not present

## 2018-04-07 LAB — COMPREHENSIVE METABOLIC PANEL
ALBUMIN: 3.7 g/dL (ref 3.5–5.0)
ALT: 18 U/L (ref 0–44)
ANION GAP: 9 (ref 5–15)
AST: 25 U/L (ref 15–41)
Alkaline Phosphatase: 93 U/L (ref 38–126)
BUN: 14 mg/dL (ref 8–23)
CO2: 23 mmol/L (ref 22–32)
Calcium: 8.9 mg/dL (ref 8.9–10.3)
Chloride: 106 mmol/L (ref 98–111)
Creatinine, Ser: 1.37 mg/dL — ABNORMAL HIGH (ref 0.61–1.24)
GFR calc Af Amer: 58 mL/min — ABNORMAL LOW (ref >60)
GFR calc non Af Amer: 50 mL/min — ABNORMAL LOW (ref >60)
Glucose, Bld: 120 mg/dL — ABNORMAL HIGH (ref 70–99)
Potassium: 4.9 mmol/L (ref 3.5–5.1)
Sodium: 138 mmol/L (ref 135–145)
TOTAL PROTEIN: 7.2 g/dL (ref 6.5–8.1)
Total Bilirubin: 1.1 mg/dL (ref 0.3–1.2)

## 2018-04-07 LAB — CBC WITH DIFFERENTIAL/PLATELET
Abs Immature Granulocytes: 0.01 10*3/uL (ref 0.00–0.07)
BASOS ABS: 0.1 10*3/uL (ref 0.0–0.1)
Basophils Relative: 1 %
EOS PCT: 2 %
Eosinophils Absolute: 0.1 10*3/uL (ref 0.0–0.5)
HCT: 45 % (ref 39.0–52.0)
Hemoglobin: 14 g/dL (ref 13.0–17.0)
Immature Granulocytes: 0 %
Lymphocytes Relative: 33 %
Lymphs Abs: 2.4 10*3/uL (ref 0.7–4.0)
MCH: 27.2 pg (ref 26.0–34.0)
MCHC: 31.1 g/dL (ref 30.0–36.0)
MCV: 87.4 fL (ref 80.0–100.0)
Monocytes Absolute: 0.5 10*3/uL (ref 0.1–1.0)
Monocytes Relative: 8 %
NRBC: 0 % (ref 0.0–0.2)
Neutro Abs: 4.1 10*3/uL (ref 1.7–7.7)
Neutrophils Relative %: 56 %
Platelets: 258 10*3/uL (ref 150–400)
RBC: 5.15 MIL/uL (ref 4.22–5.81)
RDW: 15.8 % — ABNORMAL HIGH (ref 11.5–15.5)
WBC: 7.2 10*3/uL (ref 4.0–10.5)

## 2018-04-07 LAB — LACTATE DEHYDROGENASE: LDH: 132 U/L (ref 98–192)

## 2018-04-07 LAB — MAGNESIUM: Magnesium: 2.1 mg/dL (ref 1.7–2.4)

## 2018-04-07 LAB — TSH: TSH: 1.321 u[IU]/mL (ref 0.350–4.500)

## 2018-04-07 MED ORDER — SODIUM CHLORIDE 0.9 % IV SOLN
Freq: Once | INTRAVENOUS | Status: AC
  Administered 2018-04-07: 12:00:00 via INTRAVENOUS

## 2018-04-07 MED ORDER — SODIUM CHLORIDE 0.9% FLUSH: 10.0000 mL | INTRAVENOUS | Status: DC | PRN

## 2018-04-07 MED ORDER — SODIUM CHLORIDE 0.9 % IV SOLN
480.0000 mg | Freq: Once | INTRAVENOUS | Status: AC
  Administered 2018-04-07: 480 mg via INTRAVENOUS
  Filled 2018-04-07: qty 48

## 2018-04-07 MED ORDER — HEPARIN SOD (PORK) LOCK FLUSH 100 UNIT/ML IV SOLN: 500.0000 [IU] | Freq: Once | INTRAVENOUS | Status: DC | PRN

## 2018-04-07 NOTE — Patient Instructions (Signed)
[facility] Discharge Instructions     Thank you for choosing [facility] to provide your oncology and hematology care.  To afford each patient quality time with our provider, please arrive at least 15 minutes before your scheduled appointment time.   If you have a lab appointment with the Cancer Center please come in thru the  Main Entrance and check in at the main information desk  You need to re-schedule your appointment should you arrive 10 or more minutes late.  We strive to give you quality time with our providers, and arriving late affects you and other patients whose appointments are after yours.  Also, if you no show three or more times for appointments you may be dismissed from the clinic at the providers discretion.     Again, thank you for choosing [facility].  Our hope is that these requests will decrease the amount of time that you wait before being seen by our physicians.       _____________________________________________________________  Should you have questions after your visit to [facility], please contact our office at [phone] between the hours of 8:00 a.m. and 4:30 p.m.  Voicemails left after 4:00 p.m. will not be returned until the following business day.  For prescription refill requests, have your pharmacy contact our office and allow 72 hours.    Cancer Center Support Programs:   > Cancer Support Group  2nd Tuesday of the month 1pm-2pm, Journey Room    

## 2018-04-07 NOTE — Patient Instructions (Signed)
Cancer Center Discharge Instructions for Patients Receiving Chemotherapy  Today you received the following chemotherapy agents  If you develop nausea and vomiting that is not controlled by your nausea medication, call the clinic.   BELOW ARE SYMPTOMS THAT SHOULD BE REPORTED IMMEDIATELY:  *FEVER GREATER THAN 100.5 F  *CHILLS WITH OR WITHOUT FEVER  NAUSEA AND VOMITING THAT IS NOT CONTROLLED WITH YOUR NAUSEA MEDICATION  *UNUSUAL SHORTNESS OF BREATH  *UNUSUAL BRUISING OR BLEEDING  TENDERNESS IN MOUTH AND THROAT WITH OR WITHOUT PRESENCE OF ULCERS  *URINARY PROBLEMS  *BOWEL PROBLEMS  UNUSUAL RASH Items with * indicate a potential emergency and should be followed up as soon as possible.  Feel free to call the clinic should you have any questions or concerns. The clinic phone number is [phone].  Please show the CHEMO ALERT CARD at check-in to the Emergency Department and triage nurse.   

## 2018-04-07 NOTE — Assessment & Plan Note (Signed)
1.  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  Underwent modified most, diagnosed as a collision tumor (melanoma and carcinoma), likely metastatic.  Lymph nodes were clinically negative. -PET CT scan on 07/13/2017 shows hypermetabolic right lower lobe pulmonary nodule concerning for metastasis.  Additional pulmonary nodules of the right lung which are below resolution of the PET CT.  Focal highly hypermetabolic lesion of the descending colon, status post colonoscopy on 09/21/2017-tubulovillous, tubular and adenomatous polyps. - Underwent CT-guided right lower lobe lung biopsy on 10/05/2017-consistent with malignant melanoma. -Notes from Dr. Baltazar Najjar indicate BRAF V600 mutation was negative.  We will obtain reports of it. - We discussed about the normal prognosis of metastatic malignant melanoma.  We talked about treatment options including single agent ipilimumab/nivolumab  with the overall objective response rate of 40 to 45% and 4-year overall survival of 42%.  We also discussed response rates of combination immunotherapy with ipilimumab (3 mg/kg) and nivolumab (1 mg/kg) every 3 weeks for 4 doses followed by maintenance nivolumab to be around 60% with 3-year overall survival approaching 70%.  Overall survival in patients without a BRAF mutation is slightly lower. -PET CT scan dated 11/22/2017  showed at least 3 hypermetabolic right lung nodules and several additional 2 small to characterize nodules in both lungs.  Hypermetabolic metastasis in the lateral segment of the left lobe of liver noted.  At least 3 hypermetabolic peritoneal nodules identified.  Multifocal bone metastasis,  predominantly in the left humerus, thoracic vertebra, right ilium and right femur noted.  I have also reviewed the results of the brain MRI which did not show any obvious metastatic disease. - Combination immunotherapy with Yervoy and Opdivo from 12/08/2017 through 02/15/2018 (4 cycles) -PET/CT scan on 02/06/2018 showed marked improvement in the bilateral pulmonary nodules with decrease in size and no residual hypermetabolic 1.  Left liver nodule and left iliopsoas nodule have resolved.  Peritoneal nodules have also completely resolved.  Bone metastatic disease also looks better.  Anterior right subcutaneous nodule shows slight increase in size and increase in hypermetabolism. - He was started on monthly Opdivo on 03/10/2018. - He does not have any immunotherapy related side effects at this time.  He has mild diarrhea which is chronic and stable. - We reviewed his blood work.  His ultra lites including potassium and magnesium were within normal limits.  He will continue potassium and magnesium supplements. -He may proceed with his monthly Opdivo today.  I plan to see him back in 4 weeks for follow-up.  We will plan to repeat PET scan prior to next visit.  2.  Atrial fibrillation: -He will continue Xarelto.  3.  Bone metastasis: -He will continue calcium and vitamin D supplements. -Denosumab monthly was started on 03/10/2018.Marland Kitchen

## 2018-04-07 NOTE — Progress Notes (Signed)
Labs reviewed with oncologist and ok to treat with ser creat 1.37 today.   Patient tolerated infusion with no complaints voiced.  Peripheral IV with good blood return before and after treatment.  No bruising or swelling noted at site.  Band aid applied.  VSS with discharge and left ambulatory with no s/s of distress noted.

## 2018-04-07 NOTE — Progress Notes (Signed)
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, right ([place])    12/08/2017 -  Chemotherapy    The patient had ipilimumab (YERVOY) 350 mg in sodium chloride 0.9 % 150 mL chemo infusion, 3 mg/kg = 350 mg, Intravenous,  Once, 4 of 4 cycles Administration: 350 mg (12/08/2017), 350 mg (12/29/2017), 350 mg (01/20/2018), 350 mg (02/15/2018) nivolumab (OPDIVO) 116 mg in sodium chloride 0.9 % 100 mL chemo infusion, 1 mg/kg = 116 mg, Intravenous, Once, 6 of 10 cycles Dose modification: 480 mg (original dose 480 mg, Cycle 5, Reason: Other (see comments)) Administration: 116 mg (12/08/2017), 480 mg (03/10/2018), 116 mg (12/29/2017), 116 mg (01/20/2018), 100 mg (02/15/2018), 480 mg (04/07/2018)  for chemotherapy treatment.       INTERVAL HISTORY:  Christopher Baird 73 y.o. 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 He is doing well and tolerating treatment well. He does reports he has weakness and SOB with exertion. He is eating out a lot more due to his wife not cooking as much. Denies any nausea, vomiting, or diarrhea. Denies any new pains. Had not noticed any recent bleeding such as epistaxis, hematuria or hematochezia. Denies recent chest pain on exertion, shortness of breath on minimal exertion, pre-syncopal episodes, or palpitations. Denies any numbness or tingling in hands or feet. Denies any recent fevers, infections, or recent hospitalizations. Patient reports appetite at 100% and energy level at 100%.  REVIEW OF SYSTEMS:  Review of Systems  All other systems reviewed and are  negative.    PAST MEDICAL/SURGICAL HISTORY:  Past Medical History:  Diagnosis Date  . Arthritis, degenerative   . Bone metastases ([place]) 12/29/2017  . Calculus of kidney   . Gout, unspecified   . Skin cancer   . Unspecified essential hypertension    Past Surgical History:  Procedure Laterality Date  . None       SOCIAL HISTORY:  Social History   Socioeconomic History  . Marital status: Married    Spouse name: Not on file  . Number of children: 1  . Years of education: Not on file  . Highest education level: Not on file  Occupational History  . Occupation: Retired    Comment: Chief Financial Officer  Social Needs  . Financial resource strain: Not hard at all  . Food insecurity:    Worry: Never true    Inability: Never true  . Transportation needs:    Medical: No    Non-medical: No  Tobacco Use  . Smoking status: Never Smoker  . Smokeless tobacco: Never Used  Substance and Sexual Activity  . Alcohol use: No  . Drug use: No  . Sexual activity: Not on file  Lifestyle  . Physical activity:    Days per week: 0 days    Minutes per session: 0 min  . Stress: To some extent  Relationships  . Social connections:    Talks on phone: More than three times a week    Gets together: Once a week    Attends religious service: 1 to 4 times per year  Active member of club or organization: No    Attends meetings of clubs or organizations: Never    Relationship status: Married  . Intimate partner violence:    Fear of current or ex partner: No    Emotionally abused: No    Physically abused: No    Forced sexual activity: No  Other Topics Concern  . Not on file  Social History Narrative  . Not on file    FAMILY HISTORY:  Family History  Problem Relation Age of Onset  . Heart disease Mother   . Colon cancer Mother   . Stroke Mother   . Alcohol abuse Father        Cause of death is Suicide  . Breast cancer Sister   . Mental illness Paternal Aunt   . Alcohol  abuse Paternal Uncle     CURRENT MEDICATIONS:  Outpatient Encounter Medications as of 04/07/2018  Medication Sig  . allopurinol (ZYLOPRIM) 300 MG tablet Take 300 mg by mouth daily.  Marland Kitchen atorvastatin (LIPITOR) 10 MG tablet Take 10 mg by mouth daily.  Marland Kitchen diltiazem (CARDIZEM CD) 240 MG 24 hr capsule TAKE 1 CAPSULE (240 MG TOTAL) BY MOUTH EVERY DAY  . flurazepam (DALMANE) 30 MG capsule Take 30 mg by mouth at bedtime.  . gabapentin (NEURONTIN) 300 MG capsule Take 300 mg by mouth 3 (three) times daily.  Marland Kitchen Ipilimumab (YERVOY IV) Inject into the vein every 21 ( twenty-one) days.   Marland Kitchen KLOR-CON M20 20 MEQ tablet TAKE 1 TABLET BY MOUTH EVERY DAY  . lisinopril (PRINIVIL,ZESTRIL) 40 MG tablet Take 40 mg by mouth daily.   . magnesium oxide (MAG-OX) 400 MG tablet TAKE 1 TABLET BY MOUTH TWICE A DAY  . metFORMIN (GLUCOPHAGE) 500 MG tablet Take 500 mg by mouth daily.  . metoprolol succinate (TOPROL-XL) 50 MG 24 hr tablet TAKE 1 TABLET BY MOUTH TWICE A DAY WITH FOOD  . Multiple Vitamin (MULTIVITAMIN) tablet Take 1 tablet by mouth daily.  . Nivolumab (OPDIVO IV) Inject into the vein every 21 ( twenty-one) days.   . prochlorperazine (COMPAZINE) 10 MG tablet Take 1 tablet (10 mg total) by mouth every 6 (six) hours as needed for nausea or vomiting.  . rivaroxaban (XARELTO) 20 MG TABS tablet Take 1 tablet (20 mg total) by mouth daily with supper.  . [DISCONTINUED] XARELTO 20 MG TABS tablet TAKE 1 TABLET (20 MG TOTAL) BY MOUTH DAILY WITH SUPPER.   No facility-administered encounter medications on file as of 04/07/2018.     ALLERGIES:  No Known Allergies   PHYSICAL EXAM:  ECOG Performance status: 1  Vitals:   04/07/18 1107  BP: 138/67  Pulse: 87  Resp: 18  Temp: 97.7 F (36.5 C)  SpO2: 99%   Filed Weights   04/07/18 1107  Weight: 237 lb (107.5 kg)    Physical Exam Constitutional:      Appearance: Normal appearance. He is normal weight.  Cardiovascular:     Rate and Rhythm: Normal rate and regular  rhythm.     Heart sounds: Normal heart sounds.  Pulmonary:     Effort: Pulmonary effort is normal.     Breath sounds: Normal breath sounds.  Musculoskeletal: Normal range of motion.  Skin:    General: Skin is warm and dry.  Neurological:     Mental Status: He is alert and oriented to person, place, and time. Mental status is at baseline.  Psychiatric:        Mood and Affect: Mood normal.  Behavior: Behavior normal.        Thought Content: Thought content normal.        Judgment: Judgment normal.      LABORATORY DATA:  I have reviewed the labs as listed.  CBC    Component Value Date/Time   WBC 7.2 04/07/2018 1018   RBC 5.15 04/07/2018 1018   HGB 14.0 04/07/2018 1018   HCT 45.0 04/07/2018 1018   PLT 258 04/07/2018 1018   MCV 87.4 04/07/2018 1018   MCH 27.2 04/07/2018 1018   MCHC 31.1 04/07/2018 1018   RDW 15.8 (H) 04/07/2018 1018   LYMPHSABS 2.4 04/07/2018 1018   MONOABS 0.5 04/07/2018 1018   EOSABS 0.1 04/07/2018 1018   BASOSABS 0.1 04/07/2018 1018   CMP Latest Ref Rng & Units 04/07/2018 03/10/2018 02/15/2018  Glucose 70 - 99 mg/dL 120(H) 120(H) 130(H)  BUN 8 - 23 mg/dL 14 12 8   Creatinine 0.61 - 1.24 mg/dL 1.37(H) 1.16 1.06  Sodium 135 - 145 mmol/L 138 138 140  Potassium 3.5 - 5.1 mmol/L 4.9 5.1 3.1(L)  Chloride 98 - 111 mmol/L 106 105 103  CO2 22 - 32 mmol/L 23 24 29   Calcium 8.9 - 10.3 mg/dL 8.9 9.4 8.7(L)  Total Protein 6.5 - 8.1 g/dL 7.2 7.6 7.2  Total Bilirubin 0.3 - 1.2 mg/dL 1.1 1.0 1.0  Alkaline Phos 38 - 126 U/L 93 94 96  AST 15 - 41 U/L 25 19 20   ALT 0 - 44 U/L 18 11 12        DIAGNOSTIC IMAGING:  I have independently reviewed the scans and discussed with the patient.   I have reviewed Francene Finders, NP's note and agree with the documentation.  I personally performed a face-to-face visit, made revisions and my assessment and plan is as follows.    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  Metastatic melanoma to  the lung, BRAF negative: - History of melanoma of the scalp vertex treated with Mohs surgery in November 3329, uncertain staging as no access to pathology. 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  Underwent modified most, diagnosed as a collision tumor (melanoma and carcinoma), likely metastatic.  Lymph nodes were clinically negative. -PET CT scan on 07/13/2017 shows hypermetabolic right lower lobe pulmonary nodule concerning for metastasis.  Additional pulmonary nodules of the right lung which are below resolution of the PET CT.  Focal highly hypermetabolic lesion of the descending colon, status post colonoscopy on 09/21/2017-tubulovillous, tubular and adenomatous polyps. - Underwent CT-guided right lower lobe lung biopsy on 10/05/2017-consistent with malignant melanoma. -Notes from Dr. Baltazar Najjar indicate BRAF V600 mutation was negative.  We will obtain reports of it. - We discussed about the normal prognosis of metastatic malignant melanoma.  We talked about treatment options including single agent ipilimumab/nivolumab  with the overall objective response rate of 40 to 45% and 4-year overall survival of 42%.  We also discussed response rates of combination immunotherapy with ipilimumab (3 mg/kg) and nivolumab (1 mg/kg) every 3 weeks for 4 doses followed by maintenance nivolumab to be around 60% with 3-year overall survival approaching 70%.  Overall survival in patients without a BRAF mutation is slightly lower. -PET CT scan dated 11/22/2017  showed at least 3 hypermetabolic right lung nodules and several additional 2 small to characterize nodules in both lungs.  Hypermetabolic metastasis in the lateral  segment of the left lobe of liver noted.  At least 3 hypermetabolic peritoneal nodules identified.  Multifocal bone metastasis, predominantly in the left humerus,  thoracic vertebra, right ilium and right femur noted.  I have also reviewed the results of the brain MRI which did not show any obvious metastatic disease. - Combination immunotherapy with Yervoy and Opdivo from 12/08/2017 through 02/15/2018 (4 cycles) -PET/CT scan on 02/06/2018 showed marked improvement in the bilateral pulmonary nodules with decrease in size and no residual hypermetabolic 1.  Left liver nodule and left iliopsoas nodule have resolved.  Peritoneal nodules have also completely resolved.  Bone metastatic disease also looks better.  Anterior right subcutaneous nodule shows slight increase in size and increase in hypermetabolism. - He was started on monthly Opdivo on 03/10/2018. - He does not have any immunotherapy related side effects at this time.  He has mild diarrhea which is chronic and stable. - We reviewed his blood work.  His ultra lites including potassium and magnesium were within normal limits.  He will continue potassium and magnesium supplements. -He may proceed with his monthly Opdivo today.  I plan to see him back in 4 weeks for follow-up.  We will plan to repeat PET scan prior to next visit.  2.  Atrial fibrillation: -He will continue Xarelto.  3.  Bone metastasis: -He will continue calcium and vitamin D supplements. -Denosumab monthly was started on 03/10/2018..      Orders placed this encounter:  Orders Placed This Encounter  Procedures  . NM PET Image Restag (PS) Skull Base To Thigh  . Magnesium  . CBC with Differential/Platelet  . Comprehensive metabolic panel  . TSH      Derek Jack, [place] [phone]

## 2018-04-10 ENCOUNTER — Other Ambulatory Visit (HOSPITAL_COMMUNITY): Payer: Self-pay | Admitting: Nurse Practitioner

## 2018-04-10 DIAGNOSIS — C7801 Secondary malignant neoplasm of right lung: Secondary | ICD-10-CM

## 2018-05-01 ENCOUNTER — Ambulatory Visit (HOSPITAL_COMMUNITY)
Admission: RE | Admit: 2018-05-01 | Discharge: 2018-05-01 | Disposition: A | Discharge status: Home / Self Care | Payer: Medicare Other | Source: Ambulatory Visit | Attending: Nurse Practitioner | Admitting: Nurse Practitioner

## 2018-05-01 DIAGNOSIS — C7801 Secondary malignant neoplasm of right lung: Secondary | ICD-10-CM | POA: Diagnosis not present

## 2018-05-01 MED ORDER — FLUDEOXYGLUCOSE F - 18 (FDG) INJECTION
14.1600 | Freq: Once | INTRAVENOUS | Status: AC | PRN
  Administered 2018-05-01: 14.16 via INTRAVENOUS

## 2018-05-04 ENCOUNTER — Other Ambulatory Visit (HOSPITAL_COMMUNITY): Payer: Self-pay | Admitting: Nurse Practitioner

## 2018-05-04 DIAGNOSIS — C7801 Secondary malignant neoplasm of right lung: Secondary | ICD-10-CM

## 2018-05-05 ENCOUNTER — Other Ambulatory Visit (HOSPITAL_COMMUNITY): Payer: Medicare Other

## 2018-05-05 ENCOUNTER — Inpatient Hospital Stay (HOSPITAL_COMMUNITY): Payer: Medicare Other | Attending: Nurse Practitioner

## 2018-05-05 ENCOUNTER — Inpatient Hospital Stay (HOSPITAL_BASED_OUTPATIENT_CLINIC_OR_DEPARTMENT_OTHER): Payer: Medicare Other | Admitting: Hematology

## 2018-05-05 ENCOUNTER — Ambulatory Visit (HOSPITAL_COMMUNITY): Payer: Medicare Other

## 2018-05-05 ENCOUNTER — Inpatient Hospital Stay (HOSPITAL_COMMUNITY): Payer: Medicare Other

## 2018-05-05 ENCOUNTER — Encounter (HOSPITAL_COMMUNITY): Payer: Self-pay | Admitting: Hematology

## 2018-05-05 VITALS — BP 123/63 | HR 55 | Temp 97.4°F | Resp 18

## 2018-05-05 VITALS — BP 124/59 | HR 73 | Temp 97.4°F | Resp 18 | Wt 246.0 lb

## 2018-05-05 DIAGNOSIS — C787 Secondary malignant neoplasm of liver and intrahepatic bile duct: Secondary | ICD-10-CM | POA: Diagnosis not present

## 2018-05-05 DIAGNOSIS — Z5112 Encounter for antineoplastic immunotherapy: Secondary | ICD-10-CM | POA: Insufficient documentation

## 2018-05-05 DIAGNOSIS — Z8249 Family history of ischemic heart disease and other diseases of the circulatory system: Secondary | ICD-10-CM | POA: Diagnosis not present

## 2018-05-05 DIAGNOSIS — R197 Diarrhea, unspecified: Secondary | ICD-10-CM

## 2018-05-05 DIAGNOSIS — I4891 Unspecified atrial fibrillation: Secondary | ICD-10-CM | POA: Diagnosis not present

## 2018-05-05 DIAGNOSIS — I10 Essential (primary) hypertension: Secondary | ICD-10-CM | POA: Insufficient documentation

## 2018-05-05 DIAGNOSIS — C7801 Secondary malignant neoplasm of right lung: Secondary | ICD-10-CM

## 2018-05-05 DIAGNOSIS — Z7901 Long term (current) use of anticoagulants: Secondary | ICD-10-CM | POA: Diagnosis not present

## 2018-05-05 DIAGNOSIS — C434 Malignant melanoma of scalp and neck: Secondary | ICD-10-CM

## 2018-05-05 DIAGNOSIS — E119 Type 2 diabetes mellitus without complications: Secondary | ICD-10-CM | POA: Diagnosis not present

## 2018-05-05 DIAGNOSIS — C7951 Secondary malignant neoplasm of bone: Secondary | ICD-10-CM | POA: Insufficient documentation

## 2018-05-05 DIAGNOSIS — Z79899 Other long term (current) drug therapy: Secondary | ICD-10-CM | POA: Insufficient documentation

## 2018-05-05 DIAGNOSIS — Z7984 Long term (current) use of oral hypoglycemic drugs: Secondary | ICD-10-CM | POA: Insufficient documentation

## 2018-05-05 LAB — COMPREHENSIVE METABOLIC PANEL
ALK PHOS: 88 U/L (ref 38–126)
ALT: 13 U/L (ref 0–44)
AST: 18 U/L (ref 15–41)
Albumin: 3.6 g/dL (ref 3.5–5.0)
Anion gap: 6 (ref 5–15)
BUN: 14 mg/dL (ref 8–23)
CALCIUM: 9 mg/dL (ref 8.9–10.3)
CO2: 25 mmol/L (ref 22–32)
Chloride: 108 mmol/L (ref 98–111)
Creatinine, Ser: 1.22 mg/dL (ref 0.61–1.24)
GFR calc Af Amer: >60 mL/min (ref >60)
GFR calc non Af Amer: 58 mL/min — ABNORMAL LOW (ref >60)
Glucose, Bld: 126 mg/dL — ABNORMAL HIGH (ref 70–99)
Potassium: 4.4 mmol/L (ref 3.5–5.1)
Sodium: 139 mmol/L (ref 135–145)
Total Bilirubin: 0.8 mg/dL (ref 0.3–1.2)
Total Protein: 7.1 g/dL (ref 6.5–8.1)

## 2018-05-05 LAB — CBC WITH DIFFERENTIAL/PLATELET
ABS IMMATURE GRANULOCYTES: 0.03 10*3/uL (ref 0.00–0.07)
Basophils Absolute: 0.1 10*3/uL (ref 0.0–0.1)
Basophils Relative: 1 %
Eosinophils Absolute: 0.2 10*3/uL (ref 0.0–0.5)
Eosinophils Relative: 3 %
HCT: 41.8 % (ref 39.0–52.0)
Hemoglobin: 12.6 g/dL — ABNORMAL LOW (ref 13.0–17.0)
Immature Granulocytes: 0 %
LYMPHS ABS: 2.2 10*3/uL (ref 0.7–4.0)
Lymphocytes Relative: 29 %
MCH: 26.9 pg (ref 26.0–34.0)
MCHC: 30.1 g/dL (ref 30.0–36.0)
MCV: 89.1 fL (ref 80.0–100.0)
Monocytes Absolute: 0.5 10*3/uL (ref 0.1–1.0)
Monocytes Relative: 7 %
Neutro Abs: 4.4 10*3/uL (ref 1.7–7.7)
Neutrophils Relative %: 60 %
Platelets: 228 10*3/uL (ref 150–400)
RBC: 4.69 MIL/uL (ref 4.22–5.81)
RDW: 17 % — ABNORMAL HIGH (ref 11.5–15.5)
WBC: 7.3 10*3/uL (ref 4.0–10.5)
nRBC: 0 % (ref 0.0–0.2)

## 2018-05-05 MED ORDER — DIPHENHYDRAMINE HCL 50 MG/ML IJ SOLN: 25.0000 mg | Freq: Once | INTRAMUSCULAR | Status: DC | PRN

## 2018-05-05 MED ORDER — SODIUM CHLORIDE 0.9 % IV SOLN: Freq: Once | INTRAVENOUS | Status: DC | PRN

## 2018-05-05 MED ORDER — SODIUM CHLORIDE 0.9 % IV SOLN
Freq: Once | INTRAVENOUS | Status: AC
  Administered 2018-05-05: 11:00:00 via INTRAVENOUS

## 2018-05-05 MED ORDER — DENOSUMAB 120 MG/1.7ML ~~LOC~~ SOLN
120.0000 mg | Freq: Once | SUBCUTANEOUS | Status: AC
  Administered 2018-05-05: 120 mg via SUBCUTANEOUS
  Filled 2018-05-05: qty 1.7

## 2018-05-05 MED ORDER — ALBUTEROL SULFATE (2.5 MG/3ML) 0.083% IN NEBU: 2.5000 mg | INHALATION_SOLUTION | Freq: Once | RESPIRATORY_TRACT | Status: DC | PRN

## 2018-05-05 MED ORDER — EPINEPHRINE HCL 0.1 MG/ML IJ SOLN: 0.2500 mg | Freq: Once | INTRAMUSCULAR | Status: DC | PRN

## 2018-05-05 MED ORDER — SODIUM CHLORIDE 0.9 % IV SOLN
480.0000 mg | Freq: Once | INTRAVENOUS | Status: AC
  Administered 2018-05-05: 480 mg via INTRAVENOUS
  Filled 2018-05-05: qty 48

## 2018-05-05 MED ORDER — METHYLPREDNISOLONE SODIUM SUCC 125 MG IJ SOLR: 125.0000 mg | Freq: Once | INTRAMUSCULAR | Status: DC | PRN

## 2018-05-05 MED ORDER — DIPHENHYDRAMINE HCL 50 MG/ML IJ SOLN: 50.0000 mg | Freq: Once | INTRAMUSCULAR | Status: DC | PRN

## 2018-05-05 NOTE — Assessment & Plan Note (Signed)
1.  Metastatic melanoma to the lung, BRAF negative: - History of melanoma of the scalp vertex treated with Mohs surgery in November 4132, uncertain staging as no access to pathology. 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  Underwent modified most, diagnosed as a collision tumor (melanoma and carcinoma), likely metastatic.  Lymph nodes were clinically negative. -PET CT scan on 07/13/2017 shows hypermetabolic right lower lobe pulmonary nodule concerning for metastasis.  Additional pulmonary nodules of the right lung which are below resolution of the PET CT.  Focal highly hypermetabolic lesion of the descending colon, status post colonoscopy on 09/21/2017-tubulovillous, tubular and adenomatous polyps. - Underwent CT-guided right lower lobe lung biopsy on 10/05/2017-consistent with malignant melanoma. -Notes from Dr. Baltazar Najjar indicate BRAF V600 mutation was negative.  We will obtain reports of it. - We discussed about the normal prognosis of metastatic malignant melanoma.  We talked about treatment options including single agent ipilimumab/nivolumab  with the overall objective response rate of 40 to 45% and 4-year overall survival of 42%.  We also discussed response rates of combination immunotherapy with ipilimumab (3 mg/kg) and nivolumab (1 mg/kg) every 3 weeks for 4 doses followed by maintenance nivolumab to be around 60% with 3-year overall survival approaching 70%.  Overall survival in patients without a BRAF mutation is slightly lower. -PET CT scan dated 11/22/2017  showed at least 3 hypermetabolic right lung nodules and several additional 2 small to characterize nodules in both lungs.  Hypermetabolic metastasis in the lateral segment of the left lobe of liver noted.  At least 3 hypermetabolic peritoneal nodules identified.  Multifocal bone metastasis,  predominantly in the left humerus, thoracic vertebra, right ilium and right femur noted.  I have also reviewed the results of the brain MRI which did not show any obvious metastatic disease. - Combination immunotherapy with Yervoy and Opdivo from 12/08/2017 through 02/15/2018 (4 cycles) -PET/CT scan on 02/06/2018 showed marked improvement in the bilateral pulmonary nodules with decrease in size and no residual hypermetabolic 1.  Left liver nodule and left iliopsoas nodule have resolved.  Peritoneal nodules have also completely resolved.  Bone metastatic disease also looks better.  Anterior right subcutaneous nodule shows slight increase in size and increase in hypermetabolism. - Opdivo monthly was started on 03/10/2018.  He was started on monthly Opdivo on 03/10/2018. - He does not have any immunotherapy related side effects.  He had diarrhea up to 2 watery stools per day which is controlled with Imodium.  Denies any new cough. -We reviewed the results of the PET CT scan dated 05/01/2018.  This showed right lower lobe groundglass nodule now measures 13 mm, previously 10 mm, however SUV was 1.8.  Other lung nodules are stable.  17 mm subcutaneous nodule in the anterior right abdominal wall has increased from 91m to 17 mm.  However SUV has decreased from 8 to 4.  Stable mild adenopathy in the hepato-duodenal ligament.  Osseous hypermetabolism has decreased in the interval. -Physical examination today shows a vague nodule palpable in the right mid clavicular line.  I have recommended biopsy of this nodule.  Patient would like to wait and watch it as he has a lot of things going on at home with his demented wife. - I plan to repeat his PET scan and MRI of the brain in 3 months.  He will continue monthly Opdivo. -I will reevaluate his anterior  abdominal wall lesion in 2 months.  2.  Atrial fibrillation: -He will continue Xarelto.  3.  Bone metastasis: -Denosumab monthly was started on 03/10/2018. -He will  continue calcium and vitamin D supplements.

## 2018-05-05 NOTE — Progress Notes (Signed)
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, right ([place])    12/08/2017 -  Chemotherapy    The patient had ipilimumab (YERVOY) 350 mg in sodium chloride 0.9 % 150 mL chemo infusion, 3 mg/kg = 350 mg, Intravenous,  Once, 4 of 4 cycles Administration: 350 mg (12/08/2017), 350 mg (12/29/2017), 350 mg (01/20/2018), 350 mg (02/15/2018) nivolumab (OPDIVO) 116 mg in sodium chloride 0.9 % 100 mL chemo infusion, 1 mg/kg = 116 mg, Intravenous, Once, 7 of 10 cycles Dose modification: 480 mg (original dose 480 mg, Cycle 5, Reason: Other (see comments)) Administration: 116 mg (12/08/2017), 480 mg (03/10/2018), 116 mg (12/29/2017), 116 mg (01/20/2018), 100 mg (02/15/2018), 480 mg (04/07/2018), 480 mg (05/05/2018)  for chemotherapy treatment.       INTERVAL HISTORY:  Christopher Baird 73 y.o. 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 He reports having more diarrhea after his last treatment. He had to take the imodium a few times and it did help. He denies any new cough. Denies any nausea or vomiting. Denies any new pains. Had not noticed any recent bleeding such as epistaxis, hematuria or hematochezia. Denies recent chest pain on exertion, shortness of breath on minimal exertion, pre-syncopal episodes, or palpitations. Denies any numbness or tingling in hands or feet. Denies any recent fevers, infections, or recent hospitalizations. Patient reports appetite at 75% and energy level at 75%. He is still eating well and maintaining his weight.    REVIEW OF SYSTEMS:  Review of Systems    Gastrointestinal: Positive for diarrhea.  All other systems reviewed and are negative.    PAST MEDICAL/SURGICAL HISTORY:  Past Medical History:  Diagnosis Date  . Arthritis, degenerative   . Bone metastases ([place]) 12/29/2017  . Calculus of kidney   . Gout, unspecified   . Skin cancer   . Unspecified essential hypertension    Past Surgical History:  Procedure Laterality Date  . None       SOCIAL HISTORY:  Social History   Socioeconomic History  . Marital status: Married    Spouse name: Not on file  . Number of children: 1  . Years of education: Not on file  . Highest education level: Not on file  Occupational History  . Occupation: Retired    Comment: Chief Financial Officer  Social Needs  . Financial resource strain: Not hard at all  . Food insecurity:    Worry: Never true    Inability: Never true  . Transportation needs:    Medical: No    Non-medical: No  Tobacco Use  . Smoking status: Never Smoker  . Smokeless tobacco: Never Used  Substance and Sexual Activity  . Alcohol use: No  . Drug use: No  . Sexual activity: Not on file  Lifestyle  . Physical activity:    Days per week: 0 days    Minutes per session: 0 min  . Stress: To some extent  Relationships  . Social connections:    Talks on phone: More than three times a week    Gets together: Once a week  Attends religious service: 1 to 4 times per year    Active member of club or organization: No    Attends meetings of clubs or organizations: Never    Relationship status: Married  . Intimate partner violence:    Fear of current or ex partner: No    Emotionally abused: No    Physically abused: No    Forced sexual activity: No  Other Topics Concern  . Not on file  Social History Narrative  . Not on file    FAMILY HISTORY:  Family History  Problem Relation Age of Onset  . Heart disease Mother   . Colon cancer Mother   . Stroke Mother   . Alcohol abuse Father        Cause of death is  Suicide  . Breast cancer Sister   . Mental illness Paternal Aunt   . Alcohol abuse Paternal Uncle     CURRENT MEDICATIONS:  Outpatient Encounter Medications as of 05/05/2018  Medication Sig  . allopurinol (ZYLOPRIM) 300 MG tablet Take 300 mg by mouth daily.  Marland Kitchen atorvastatin (LIPITOR) 10 MG tablet Take 10 mg by mouth daily.  Marland Kitchen diltiazem (CARDIZEM CD) 240 MG 24 hr capsule TAKE 1 CAPSULE (240 MG TOTAL) BY MOUTH EVERY DAY  . flurazepam (DALMANE) 30 MG capsule Take 30 mg by mouth at bedtime.  . gabapentin (NEURONTIN) 300 MG capsule Take 300 mg by mouth 3 (three) times daily.  Marland Kitchen Ipilimumab (YERVOY IV) Inject into the vein every 21 ( twenty-one) days.   Marland Kitchen KLOR-CON M20 20 MEQ tablet TAKE 1 TABLET BY MOUTH EVERY DAY  . lisinopril (PRINIVIL,ZESTRIL) 40 MG tablet Take 40 mg by mouth daily.   . magnesium oxide (MAG-OX) 400 MG tablet TAKE 1 TABLET BY MOUTH TWICE A DAY  . metFORMIN (GLUCOPHAGE) 500 MG tablet Take 500 mg by mouth daily.  . metoprolol succinate (TOPROL-XL) 50 MG 24 hr tablet TAKE 1 TABLET BY MOUTH TWICE A DAY WITH FOOD  . Multiple Vitamin (MULTIVITAMIN) tablet Take 1 tablet by mouth daily.  . Nivolumab (OPDIVO IV) Inject into the vein every 21 ( twenty-one) days.   . prochlorperazine (COMPAZINE) 10 MG tablet Take 1 tablet (10 mg total) by mouth every 6 (six) hours as needed for nausea or vomiting.  . rivaroxaban (XARELTO) 20 MG TABS tablet Take 1 tablet (20 mg total) by mouth daily with supper.   No facility-administered encounter medications on file as of 05/05/2018.     ALLERGIES:  No Known Allergies   PHYSICAL EXAM:  ECOG Performance status: 1  Vitals:   05/05/18 0937  BP: (!) 124/59  Pulse: 73  Resp: 18  Temp: (!) 97.4 F (36.3 C)  SpO2: 99%   Filed Weights   05/05/18 0937  Weight: 246 lb (111.6 kg)    Physical Exam Constitutional:      Appearance: Normal appearance. He is normal weight.  Cardiovascular:     Rate and Rhythm: Normal rate and regular rhythm.      Heart sounds: Normal heart sounds.  Pulmonary:     Effort: Pulmonary effort is normal.     Breath sounds: Normal breath sounds.  Abdominal:     General: Abdomen is flat.     Palpations: Abdomen is soft.  Musculoskeletal: Normal range of motion.  Skin:    General: Skin is warm and dry.  Neurological:     Mental Status: He is alert and oriented to person, place, and time. Mental status is at baseline.  Psychiatric:        Mood and Affect: Mood normal.        Behavior: Behavior normal.        Thought Content: Thought content normal.        Judgment: Judgment normal.   Small nodule palpable in the anterior abdominal wall, in the midclavicular line.   LABORATORY DATA:  I have reviewed the labs as listed.  CBC    Component Value Date/Time   WBC 7.3 05/05/2018 0849   RBC 4.69 05/05/2018 0849   HGB 12.6 (L) 05/05/2018 0849   HCT 41.8 05/05/2018 0849   PLT 228 05/05/2018 0849   MCV 89.1 05/05/2018 0849   MCH 26.9 05/05/2018 0849   MCHC 30.1 05/05/2018 0849   RDW 17.0 (H) 05/05/2018 0849   LYMPHSABS 2.2 05/05/2018 0849   MONOABS 0.5 05/05/2018 0849   EOSABS 0.2 05/05/2018 0849   BASOSABS 0.1 05/05/2018 0849   CMP Latest Ref Rng & Units 05/05/2018 04/07/2018 03/10/2018  Glucose 70 - 99 mg/dL 126(H) 120(H) 120(H)  BUN 8 - 23 mg/dL 14 14 12   Creatinine 0.61 - 1.24 mg/dL 1.22 1.37(H) 1.16  Sodium 135 - 145 mmol/L 139 138 138  Potassium 3.5 - 5.1 mmol/L 4.4 4.9 5.1  Chloride 98 - 111 mmol/L 108 106 105  CO2 22 - 32 mmol/L 25 23 24   Calcium 8.9 - 10.3 mg/dL 9.0 8.9 9.4  Total Protein 6.5 - 8.1 g/dL 7.1 7.2 7.6  Total Bilirubin 0.3 - 1.2 mg/dL 0.8 1.1 1.0  Alkaline Phos 38 - 126 U/L 88 93 94  AST 15 - 41 U/L 18 25 19   ALT 0 - 44 U/L 13 18 11        DIAGNOSTIC IMAGING:  I have independently reviewed the scans and discussed with the patient.   I have reviewed Francene Finders, NP's note and agree with the documentation.  I personally performed a face-to-face visit, made  revisions and my assessment and plan is as follows.    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  Metastatic melanoma to the lung, BRAF negative: - History of melanoma of the scalp vertex treated with Mohs surgery in November 7106, uncertain staging as no access to pathology. 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  Underwent modified most, diagnosed as a collision tumor (melanoma and carcinoma), likely metastatic.  Lymph nodes were clinically negative. -PET CT scan on 07/13/2017 shows hypermetabolic right lower lobe pulmonary nodule concerning for metastasis.  Additional pulmonary nodules of the right lung which are below resolution of the PET CT.  Focal highly hypermetabolic lesion of the descending colon, status post colonoscopy on 09/21/2017-tubulovillous, tubular and adenomatous polyps. - Underwent CT-guided right lower lobe lung biopsy on 10/05/2017-consistent with malignant melanoma. -Notes from Dr. Baltazar Najjar indicate BRAF V600 mutation was negative.  We will obtain reports of it. - We discussed about the normal prognosis of metastatic malignant melanoma.  We talked about treatment options including single agent ipilimumab/nivolumab  with the overall objective response rate of 40 to 45% and 4-year overall survival of 42%.  We also discussed response rates of combination immunotherapy with ipilimumab (3 mg/kg) and nivolumab (1 mg/kg) every 3 weeks for 4 doses followed by maintenance nivolumab to be around 60% with 3-year overall survival approaching 70%.  Overall survival in patients without a BRAF mutation is slightly  lower. -PET CT scan dated 11/22/2017  showed at least 3 hypermetabolic right lung nodules and several additional 2 small to characterize nodules in both lungs.  Hypermetabolic metastasis in the lateral segment of  the left lobe of liver noted.  At least 3 hypermetabolic peritoneal nodules identified.  Multifocal bone metastasis, predominantly in the left humerus, thoracic vertebra, right ilium and right femur noted.  I have also reviewed the results of the brain MRI which did not show any obvious metastatic disease. - Combination immunotherapy with Yervoy and Opdivo from 12/08/2017 through 02/15/2018 (4 cycles) -PET/CT scan on 02/06/2018 showed marked improvement in the bilateral pulmonary nodules with decrease in size and no residual hypermetabolic 1.  Left liver nodule and left iliopsoas nodule have resolved.  Peritoneal nodules have also completely resolved.  Bone metastatic disease also looks better.  Anterior right subcutaneous nodule shows slight increase in size and increase in hypermetabolism. - Opdivo monthly was started on 03/10/2018.  He was started on monthly Opdivo on 03/10/2018. - He does not have any immunotherapy related side effects.  He had diarrhea up to 2 watery stools per day which is controlled with Imodium.  Denies any new cough. -We reviewed the results of the PET CT scan dated 05/01/2018.  This showed right lower lobe groundglass nodule now measures 13 mm, previously 10 mm, however SUV was 1.8.  Other lung nodules are stable.  17 mm subcutaneous nodule in the anterior right abdominal wall has increased from 72m to 17 mm.  However SUV has decreased from 8 to 4.  Stable mild adenopathy in the hepato-duodenal ligament.  Osseous hypermetabolism has decreased in the interval. -Physical examination today shows a vague nodule palpable in the right mid clavicular line.  I have recommended biopsy of this nodule.  Patient would like to wait and watch it as he has a lot of things going on at home with his demented wife. - I plan to repeat his PET scan and MRI of the brain in 3 months.  He will continue monthly Opdivo. -I will reevaluate his anterior abdominal wall lesion in 2 months.  2.  Atrial  fibrillation: -He will continue Xarelto.  3.  Bone metastasis: -Denosumab monthly was started on 03/10/2018. -He will continue calcium and vitamin D supplements.       Orders placed this encounter:  Orders Placed This Encounter  Procedures  . MR Brain W Wo Contrast  . NM PET Image Restag (PS) Skull Base To Thigh  . TSH  . CBC with Differential/Platelet  . Comprehensive metabolic panel  . Lactate dehydrogenase      SDerek Jack MD [phone]

## 2018-05-05 NOTE — Patient Instructions (Signed)
[facility] Discharge Instructions for Patients Receiving Chemotherapy  Today you received the following chemotherapy agents   To help prevent nausea and vomiting after your treatment, we encourage you to take your nausea medication   If you develop nausea and vomiting that is not controlled by your nausea medication, call the clinic.   BELOW ARE SYMPTOMS THAT SHOULD BE REPORTED IMMEDIATELY:  *FEVER GREATER THAN 100.5 F  *CHILLS WITH OR WITHOUT FEVER  NAUSEA AND VOMITING THAT IS NOT CONTROLLED WITH YOUR NAUSEA MEDICATION  *UNUSUAL SHORTNESS OF BREATH  *UNUSUAL BRUISING OR BLEEDING  TENDERNESS IN MOUTH AND THROAT WITH OR WITHOUT PRESENCE OF ULCERS  *URINARY PROBLEMS  *BOWEL PROBLEMS  UNUSUAL RASH Items with * indicate a potential emergency and should be followed up as soon as possible.  Feel free to call the clinic should you have any questions or concerns. The clinic phone number is [phone].  Please show the CHEMO ALERT CARD at check-in to the Emergency Department and triage nurse.   

## 2018-05-05 NOTE — Progress Notes (Signed)
Pt presents today for Opdivo and XGeva. VSS. Pt seen by Dr. Delton Coombes. Labs reviewed. Proceed with treatment per RLockamy NP.   Christopher Baird presents today for injection per MD orders. XGeva administered SQ in left Upper Arm. Administration without incident. Patient tolerated well.  Treatment given today per MD orders. Tolerated infusion without adverse affects. Vital signs stable. No complaints at this time. Discharged from clinic ambulatory. F/U with [facility] as scheduled.

## 2018-05-05 NOTE — Patient Instructions (Signed)
[facility] at [facility] Discharge Instructions     Thank you for choosing [facility] at [facility] to provide your oncology and hematology care.  To afford each patient quality time with our provider, please arrive at least 15 minutes before your scheduled appointment time.   If you have a lab appointment with the Cancer Center please come in thru the  Main Entrance and check in at the main information desk  You need to re-schedule your appointment should you arrive 10 or more minutes late.  We strive to give you quality time with our providers, and arriving late affects you and other patients whose appointments are after yours.  Also, if you no show three or more times for appointments you may be dismissed from the clinic at the providers discretion.     Again, thank you for choosing [facility].  Our hope is that these requests will decrease the amount of time that you wait before being seen by our physicians.       _____________________________________________________________  Should you have questions after your visit to [facility], please contact our office at [phone] between the hours of 8:00 a.m. and 4:30 p.m.  Voicemails left after 4:00 p.m. will not be returned until the following business day.  For prescription refill requests, have your pharmacy contact our office and allow 72 hours.    Cancer Center Support Programs:   > Cancer Support Group  2nd Tuesday of the month 1pm-2pm, Journey Room    

## 2018-05-28 ENCOUNTER — Other Ambulatory Visit (HOSPITAL_COMMUNITY): Payer: Self-pay | Admitting: Nurse Practitioner

## 2018-05-28 DIAGNOSIS — C7801 Secondary malignant neoplasm of right lung: Secondary | ICD-10-CM

## 2018-06-02 ENCOUNTER — Other Ambulatory Visit: Payer: Self-pay

## 2018-06-02 ENCOUNTER — Inpatient Hospital Stay (HOSPITAL_COMMUNITY): Payer: Medicare Other

## 2018-06-02 ENCOUNTER — Inpatient Hospital Stay (HOSPITAL_COMMUNITY): Payer: Medicare Other | Attending: Hematology

## 2018-06-02 ENCOUNTER — Encounter (HOSPITAL_COMMUNITY): Payer: Self-pay

## 2018-06-02 VITALS — BP 120/61 | HR 62 | Temp 98.8°F | Resp 18 | Wt 250.2 lb

## 2018-06-02 DIAGNOSIS — C7801 Secondary malignant neoplasm of right lung: Secondary | ICD-10-CM

## 2018-06-02 DIAGNOSIS — Z5112 Encounter for antineoplastic immunotherapy: Secondary | ICD-10-CM | POA: Insufficient documentation

## 2018-06-02 DIAGNOSIS — C7951 Secondary malignant neoplasm of bone: Secondary | ICD-10-CM | POA: Insufficient documentation

## 2018-06-02 DIAGNOSIS — C787 Secondary malignant neoplasm of liver and intrahepatic bile duct: Secondary | ICD-10-CM | POA: Diagnosis not present

## 2018-06-02 DIAGNOSIS — C434 Malignant melanoma of scalp and neck: Secondary | ICD-10-CM | POA: Insufficient documentation

## 2018-06-02 LAB — CBC WITH DIFFERENTIAL/PLATELET
Abs Immature Granulocytes: 0.01 10*3/uL (ref 0.00–0.07)
BASOS ABS: 0.1 10*3/uL (ref 0.0–0.1)
Basophils Relative: 1 %
Eosinophils Absolute: 0.2 10*3/uL (ref 0.0–0.5)
Eosinophils Relative: 2 %
HCT: 40.6 % (ref 39.0–52.0)
HEMOGLOBIN: 12.7 g/dL — AB (ref 13.0–17.0)
Immature Granulocytes: 0 %
Lymphocytes Relative: 37 %
Lymphs Abs: 2.6 10*3/uL (ref 0.7–4.0)
MCH: 28 pg (ref 26.0–34.0)
MCHC: 31.3 g/dL (ref 30.0–36.0)
MCV: 89.6 fL (ref 80.0–100.0)
Monocytes Absolute: 0.5 10*3/uL (ref 0.1–1.0)
Monocytes Relative: 8 %
Neutro Abs: 3.6 10*3/uL (ref 1.7–7.7)
Neutrophils Relative %: 52 %
Platelets: 205 10*3/uL (ref 150–400)
RBC: 4.53 MIL/uL (ref 4.22–5.81)
RDW: 17.2 % — ABNORMAL HIGH (ref 11.5–15.5)
WBC: 7 10*3/uL (ref 4.0–10.5)
nRBC: 0 % (ref 0.0–0.2)

## 2018-06-02 LAB — COMPREHENSIVE METABOLIC PANEL
ALBUMIN: 3.8 g/dL (ref 3.5–5.0)
ALT: 13 U/L (ref 0–44)
AST: 21 U/L (ref 15–41)
Alkaline Phosphatase: 75 U/L (ref 38–126)
Anion gap: 9 (ref 5–15)
BUN: 21 mg/dL (ref 8–23)
CO2: 22 mmol/L (ref 22–32)
CREATININE: 1.27 mg/dL — AB (ref 0.61–1.24)
Calcium: 9.5 mg/dL (ref 8.9–10.3)
Chloride: 107 mmol/L (ref 98–111)
GFR calc Af Amer: >60 mL/min (ref >60)
GFR calc non Af Amer: 55 mL/min — ABNORMAL LOW (ref >60)
Glucose, Bld: 123 mg/dL — ABNORMAL HIGH (ref 70–99)
Potassium: 4.7 mmol/L (ref 3.5–5.1)
Sodium: 138 mmol/L (ref 135–145)
Total Bilirubin: 0.7 mg/dL (ref 0.3–1.2)
Total Protein: 7.1 g/dL (ref 6.5–8.1)

## 2018-06-02 MED ORDER — SODIUM CHLORIDE 0.9 % IV SOLN
480.0000 mg | Freq: Once | INTRAVENOUS | Status: AC
  Administered 2018-06-02: 480 mg via INTRAVENOUS
  Filled 2018-06-02: qty 48

## 2018-06-02 MED ORDER — DENOSUMAB 120 MG/1.7ML ~~LOC~~ SOLN
SUBCUTANEOUS | Status: AC
  Filled 2018-06-02: qty 1.7

## 2018-06-02 MED ORDER — SODIUM CHLORIDE 0.9 % IV SOLN
Freq: Once | INTRAVENOUS | Status: AC
  Administered 2018-06-02: 13:00:00 via INTRAVENOUS

## 2018-06-02 MED ORDER — DENOSUMAB 120 MG/1.7ML ~~LOC~~ SOLN
120.0000 mg | Freq: Once | SUBCUTANEOUS | Status: AC
  Administered 2018-06-02: 120 mg via SUBCUTANEOUS

## 2018-06-02 NOTE — Progress Notes (Signed)
Labs within parameters. VSS and within parameters. MAR reviewed. Pt presents today for Opdivo.   Treatment given today per MD orders. Tolerated infusion without adverse affects. Vital signs stable. No complaints at this time. Discharged from clinic ambulatory. F/U with [facility] as scheduled.

## 2018-06-02 NOTE — Patient Instructions (Signed)
[facility] Discharge Instructions for Patients Receiving Chemotherapy  Today you received the following chemotherapy agents   To help prevent nausea and vomiting after your treatment, we encourage you to take your nausea medication   If you develop nausea and vomiting that is not controlled by your nausea medication, call the clinic.   BELOW ARE SYMPTOMS THAT SHOULD BE REPORTED IMMEDIATELY:  *FEVER GREATER THAN 100.5 F  *CHILLS WITH OR WITHOUT FEVER  NAUSEA AND VOMITING THAT IS NOT CONTROLLED WITH YOUR NAUSEA MEDICATION  *UNUSUAL SHORTNESS OF BREATH  *UNUSUAL BRUISING OR BLEEDING  TENDERNESS IN MOUTH AND THROAT WITH OR WITHOUT PRESENCE OF ULCERS  *URINARY PROBLEMS  *BOWEL PROBLEMS  UNUSUAL RASH Items with * indicate a potential emergency and should be followed up as soon as possible.  Feel free to call the clinic should you have any questions or concerns. The clinic phone number is [phone].  Please show the CHEMO ALERT CARD at check-in to the Emergency Department and triage nurse.   

## 2018-06-23 ENCOUNTER — Other Ambulatory Visit (HOSPITAL_COMMUNITY): Payer: Self-pay | Admitting: Nurse Practitioner

## 2018-06-23 DIAGNOSIS — C7801 Secondary malignant neoplasm of right lung: Secondary | ICD-10-CM

## 2018-06-28 ENCOUNTER — Other Ambulatory Visit (HOSPITAL_COMMUNITY): Payer: Self-pay | Admitting: Hematology

## 2018-06-29 ENCOUNTER — Other Ambulatory Visit: Payer: Self-pay

## 2018-06-30 ENCOUNTER — Inpatient Hospital Stay (HOSPITAL_COMMUNITY): Payer: Medicare Other | Attending: Hematology

## 2018-06-30 ENCOUNTER — Encounter (HOSPITAL_COMMUNITY): Payer: Self-pay

## 2018-06-30 ENCOUNTER — Other Ambulatory Visit: Payer: Self-pay

## 2018-06-30 ENCOUNTER — Other Ambulatory Visit (HOSPITAL_COMMUNITY): Payer: Medicare Other

## 2018-06-30 ENCOUNTER — Ambulatory Visit (HOSPITAL_COMMUNITY): Payer: Medicare Other

## 2018-06-30 VITALS — BP 112/56 | HR 64 | Temp 97.4°F | Resp 18 | Wt 246.4 lb

## 2018-06-30 DIAGNOSIS — C7801 Secondary malignant neoplasm of right lung: Secondary | ICD-10-CM | POA: Diagnosis present

## 2018-06-30 DIAGNOSIS — C801 Malignant (primary) neoplasm, unspecified: Secondary | ICD-10-CM | POA: Diagnosis not present

## 2018-06-30 DIAGNOSIS — C7951 Secondary malignant neoplasm of bone: Secondary | ICD-10-CM | POA: Diagnosis not present

## 2018-06-30 DIAGNOSIS — C3491 Malignant neoplasm of unspecified part of right bronchus or lung: Secondary | ICD-10-CM | POA: Insufficient documentation

## 2018-06-30 DIAGNOSIS — Z5112 Encounter for antineoplastic immunotherapy: Secondary | ICD-10-CM | POA: Insufficient documentation

## 2018-06-30 LAB — COMPREHENSIVE METABOLIC PANEL
ALT: 14 U/L (ref 0–44)
AST: 21 U/L (ref 15–41)
Albumin: 3.9 g/dL (ref 3.5–5.0)
Alkaline Phosphatase: 73 U/L (ref 38–126)
Anion gap: 9 (ref 5–15)
BUN: 16 mg/dL (ref 8–23)
CO2: 22 mmol/L (ref 22–32)
Calcium: 8.9 mg/dL (ref 8.9–10.3)
Chloride: 108 mmol/L (ref 98–111)
Creatinine, Ser: 1.32 mg/dL — ABNORMAL HIGH (ref 0.61–1.24)
GFR calc Af Amer: >60 mL/min (ref >60)
GFR calc non Af Amer: 53 mL/min — ABNORMAL LOW (ref >60)
Glucose, Bld: 113 mg/dL — ABNORMAL HIGH (ref 70–99)
Potassium: 4.5 mmol/L (ref 3.5–5.1)
Sodium: 139 mmol/L (ref 135–145)
Total Bilirubin: 0.8 mg/dL (ref 0.3–1.2)
Total Protein: 7.3 g/dL (ref 6.5–8.1)

## 2018-06-30 LAB — CBC WITH DIFFERENTIAL/PLATELET
Abs Immature Granulocytes: 0.03 10*3/uL (ref 0.00–0.07)
Basophils Absolute: 0.1 10*3/uL (ref 0.0–0.1)
Basophils Relative: 1 %
Eosinophils Absolute: 0.1 10*3/uL (ref 0.0–0.5)
Eosinophils Relative: 2 %
HCT: 43.3 % (ref 39.0–52.0)
Hemoglobin: 13.3 g/dL (ref 13.0–17.0)
Immature Granulocytes: 0 %
Lymphocytes Relative: 28 %
Lymphs Abs: 2.3 10*3/uL (ref 0.7–4.0)
MCH: 28.1 pg (ref 26.0–34.0)
MCHC: 30.7 g/dL (ref 30.0–36.0)
MCV: 91.5 fL (ref 80.0–100.0)
Monocytes Absolute: 0.7 10*3/uL (ref 0.1–1.0)
Monocytes Relative: 9 %
Neutro Abs: 5 10*3/uL (ref 1.7–7.7)
Neutrophils Relative %: 60 %
Platelets: 228 10*3/uL (ref 150–400)
RBC: 4.73 MIL/uL (ref 4.22–5.81)
RDW: 16 % — ABNORMAL HIGH (ref 11.5–15.5)
WBC: 8.2 10*3/uL (ref 4.0–10.5)
nRBC: 0 % (ref 0.0–0.2)

## 2018-06-30 MED ORDER — SODIUM CHLORIDE 0.9 % IV SOLN
480.0000 mg | Freq: Once | INTRAVENOUS | Status: AC
  Administered 2018-06-30: 480 mg via INTRAVENOUS
  Filled 2018-06-30: qty 48

## 2018-06-30 MED ORDER — MAGNESIUM OXIDE 400 MG PO TABS: 1.0000 | ORAL_TABLET | Freq: Two times a day (BID) | ORAL | 1 refills | Status: DC

## 2018-06-30 MED ORDER — SODIUM CHLORIDE 0.9 % IV SOLN
Freq: Once | INTRAVENOUS | Status: AC
  Administered 2018-06-30: 11:00:00 via INTRAVENOUS

## 2018-06-30 MED ORDER — DENOSUMAB 120 MG/1.7ML ~~LOC~~ SOLN
120.0000 mg | Freq: Once | SUBCUTANEOUS | Status: AC
  Administered 2018-06-30: 120 mg via SUBCUTANEOUS
  Filled 2018-06-30: qty 1.7

## 2018-06-30 NOTE — Patient Instructions (Signed)
[facility] Discharge Instructions for Patients Receiving Chemotherapy   Beginning January 23rd 2017 lab work for the [facility] will be done in the  Main lab at [facility] on 1st floor. If you have a lab appointment with the [facility] please come in thru the  Main Entrance and check in at the main information desk   Today you received the following chemotherapy agents Opdivo as well as Xgeva injection. Follow-up as scheduled. Call clinic for any questions or concerns  To help prevent nausea and vomiting after your treatment, we encourage you to take your nausea medication   If you develop nausea and vomiting, or diarrhea that is not controlled by your medication, call the clinic.  The clinic phone number is [phone]. Office hours are Monday-Friday 8:30am-5:00pm.  BELOW ARE SYMPTOMS THAT SHOULD BE REPORTED IMMEDIATELY:  *FEVER GREATER THAN 101.0 F  *CHILLS WITH OR WITHOUT FEVER  NAUSEA AND VOMITING THAT IS NOT CONTROLLED WITH YOUR NAUSEA MEDICATION  *UNUSUAL SHORTNESS OF BREATH  *UNUSUAL BRUISING OR BLEEDING  TENDERNESS IN MOUTH AND THROAT WITH OR WITHOUT PRESENCE OF ULCERS  *URINARY PROBLEMS  *BOWEL PROBLEMS  UNUSUAL RASH Items with * indicate a potential emergency and should be followed up as soon as possible. If you have an emergency after office hours please contact your primary care physician or go to the nearest emergency department.  Please call the clinic during office hours if you have any questions or concerns.   You may also contact the Patient Navigator at [phone] should you have any questions or need assistance in obtaining follow up care.      Resources For Cancer Patients and their Caregivers ? American Cancer Society: Can assist with transportation, wigs, general needs, runs Look Good Feel Better.        602 319 6010 ? Cancer Care: Provides financial assistance, online support groups, medication/co-pay assistance.   1-800-813-HOPE 417 684 5179) ? [employer] Assists [place] cancer patients and their families through emotional , educational and financial support.  873 661 1396 ? [place] DSS Where to apply for food stamps, Medicaid and utility assistance. 415-712-9481 ? [facility]: Transportation to medical appointments. (309) 096-8002 ? Social Security Administration: May apply for disability if have a Stage IV cancer. (979) 886-2576 660-449-2617 ? [employer], Disability and Transit Services: Assists with nutrition, care and transit needs. (904)707-5238

## 2018-06-30 NOTE — Progress Notes (Signed)
Christopher Baird tolerated Opdivo infusion and Xgeva injection well without complaints or incident. Labs reviewed prior to administering these medications. Calcium 8.9 today and pt denied any tooth or jaw pain and no recent or future dental visits prior to administering Xgeva injection. Peripheral IV site checked by 2 RN's with positive blood return noted prior to and after infusion. VSS upon discharge. Pt discharged self ambulatory in satisfactory condition

## 2018-07-19 ENCOUNTER — Other Ambulatory Visit (HOSPITAL_COMMUNITY): Payer: Self-pay | Admitting: Nurse Practitioner

## 2018-07-19 DIAGNOSIS — C7801 Secondary malignant neoplasm of right lung: Secondary | ICD-10-CM

## 2018-07-24 ENCOUNTER — Other Ambulatory Visit: Payer: Self-pay

## 2018-07-24 ENCOUNTER — Ambulatory Visit (HOSPITAL_COMMUNITY)
Admission: RE | Admit: 2018-07-24 | Discharge: 2018-07-24 | Disposition: A | Discharge status: Home / Self Care | Payer: Medicare Other | Source: Ambulatory Visit | Attending: Nurse Practitioner | Admitting: Nurse Practitioner

## 2018-07-24 DIAGNOSIS — C7801 Secondary malignant neoplasm of right lung: Secondary | ICD-10-CM | POA: Diagnosis present

## 2018-07-24 MED ORDER — FLUDEOXYGLUCOSE F - 18 (FDG) INJECTION
14.9300 | Freq: Once | INTRAVENOUS | Status: AC | PRN
  Administered 2018-07-24: 14.93 via INTRAVENOUS

## 2018-07-28 ENCOUNTER — Inpatient Hospital Stay (HOSPITAL_BASED_OUTPATIENT_CLINIC_OR_DEPARTMENT_OTHER): Payer: Medicare Other | Admitting: Hematology

## 2018-07-28 ENCOUNTER — Inpatient Hospital Stay (HOSPITAL_COMMUNITY): Payer: Medicare Other | Attending: Hematology

## 2018-07-28 ENCOUNTER — Other Ambulatory Visit: Payer: Self-pay

## 2018-07-28 ENCOUNTER — Inpatient Hospital Stay (HOSPITAL_COMMUNITY): Payer: Medicare Other

## 2018-07-28 ENCOUNTER — Encounter (HOSPITAL_COMMUNITY): Payer: Self-pay | Admitting: Hematology

## 2018-07-28 VITALS — BP 142/76 | HR 71 | Temp 97.4°F | Resp 20 | Wt 248.9 lb

## 2018-07-28 VITALS — BP 120/66 | HR 65 | Temp 97.6°F | Resp 18

## 2018-07-28 DIAGNOSIS — R911 Solitary pulmonary nodule: Secondary | ICD-10-CM | POA: Insufficient documentation

## 2018-07-28 DIAGNOSIS — Z9222 Personal history of monoclonal drug therapy: Secondary | ICD-10-CM

## 2018-07-28 DIAGNOSIS — Z79899 Other long term (current) drug therapy: Secondary | ICD-10-CM | POA: Insufficient documentation

## 2018-07-28 DIAGNOSIS — R21 Rash and other nonspecific skin eruption: Secondary | ICD-10-CM | POA: Insufficient documentation

## 2018-07-28 DIAGNOSIS — C434 Malignant melanoma of scalp and neck: Secondary | ICD-10-CM

## 2018-07-28 DIAGNOSIS — I4891 Unspecified atrial fibrillation: Secondary | ICD-10-CM | POA: Insufficient documentation

## 2018-07-28 DIAGNOSIS — Z7901 Long term (current) use of anticoagulants: Secondary | ICD-10-CM

## 2018-07-28 DIAGNOSIS — C7951 Secondary malignant neoplasm of bone: Secondary | ICD-10-CM | POA: Diagnosis present

## 2018-07-28 DIAGNOSIS — Z5112 Encounter for antineoplastic immunotherapy: Secondary | ICD-10-CM | POA: Insufficient documentation

## 2018-07-28 DIAGNOSIS — C7801 Secondary malignant neoplasm of right lung: Secondary | ICD-10-CM

## 2018-07-28 DIAGNOSIS — Z8589 Personal history of malignant neoplasm of other organs and systems: Secondary | ICD-10-CM | POA: Insufficient documentation

## 2018-07-28 LAB — COMPREHENSIVE METABOLIC PANEL
ALT: 15 U/L (ref 0–44)
AST: 23 U/L (ref 15–41)
Albumin: 3.7 g/dL (ref 3.5–5.0)
Alkaline Phosphatase: 69 U/L (ref 38–126)
Anion gap: 9 (ref 5–15)
BUN: 16 mg/dL (ref 8–23)
CO2: 26 mmol/L (ref 22–32)
Calcium: 9.2 mg/dL (ref 8.9–10.3)
Chloride: 106 mmol/L (ref 98–111)
Creatinine, Ser: 1.25 mg/dL — ABNORMAL HIGH (ref 0.61–1.24)
GFR calc Af Amer: >60 mL/min (ref >60)
GFR calc non Af Amer: 56 mL/min — ABNORMAL LOW (ref >60)
Glucose, Bld: 131 mg/dL — ABNORMAL HIGH (ref 70–99)
Potassium: 4.6 mmol/L (ref 3.5–5.1)
Sodium: 141 mmol/L (ref 135–145)
Total Bilirubin: 1.1 mg/dL (ref 0.3–1.2)
Total Protein: 7.2 g/dL (ref 6.5–8.1)

## 2018-07-28 LAB — CBC WITH DIFFERENTIAL/PLATELET
Abs Immature Granulocytes: 0.03 K/uL (ref 0.00–0.07)
Basophils Absolute: 0 K/uL (ref 0.0–0.1)
Basophils Relative: 0 %
Eosinophils Absolute: 0.2 K/uL (ref 0.0–0.5)
Eosinophils Relative: 2 %
HCT: 43.4 % (ref 39.0–52.0)
Hemoglobin: 13.3 g/dL (ref 13.0–17.0)
Immature Granulocytes: 0 %
Lymphocytes Relative: 28 %
Lymphs Abs: 2.3 K/uL (ref 0.7–4.0)
MCH: 28.5 pg (ref 26.0–34.0)
MCHC: 30.6 g/dL (ref 30.0–36.0)
MCV: 92.9 fL (ref 80.0–100.0)
Monocytes Absolute: 0.7 K/uL (ref 0.1–1.0)
Monocytes Relative: 8 %
Neutro Abs: 5 K/uL (ref 1.7–7.7)
Neutrophils Relative %: 62 %
Platelets: 206 K/uL (ref 150–400)
RBC: 4.67 MIL/uL (ref 4.22–5.81)
RDW: 15.6 % — ABNORMAL HIGH (ref 11.5–15.5)
WBC: 8.2 K/uL (ref 4.0–10.5)
nRBC: 0 % (ref 0.0–0.2)

## 2018-07-28 LAB — TSH: TSH: 1.649 u[IU]/mL (ref 0.350–4.500)

## 2018-07-28 LAB — LACTATE DEHYDROGENASE: LDH: 139 U/L (ref 98–192)

## 2018-07-28 MED ORDER — SODIUM CHLORIDE 0.9% FLUSH
10.0000 mL | INTRAVENOUS | Status: DC | PRN
  Administered 2018-07-28: 10 mL
  Filled 2018-07-28: qty 10

## 2018-07-28 MED ORDER — DENOSUMAB 120 MG/1.7ML ~~LOC~~ SOLN
120.0000 mg | Freq: Once | SUBCUTANEOUS | Status: AC
  Administered 2018-07-28: 120 mg via SUBCUTANEOUS
  Filled 2018-07-28: qty 1.7

## 2018-07-28 MED ORDER — SODIUM CHLORIDE 0.9 % IV SOLN
480.0000 mg | Freq: Once | INTRAVENOUS | Status: AC
  Administered 2018-07-28: 480 mg via INTRAVENOUS
  Filled 2018-07-28: qty 48

## 2018-07-28 MED ORDER — SODIUM CHLORIDE 0.9 % IV SOLN
Freq: Once | INTRAVENOUS | Status: AC
  Administered 2018-07-28: 11:00:00 via INTRAVENOUS

## 2018-07-28 NOTE — Patient Instructions (Addendum)
[facility] at [facility] Discharge Instructions  You were seen today by Dr. Delton Coombes. He went over your recent lab results. He will see you back in 4 weeks for labs, treatment and follow up.   Thank you for choosing [facility] at [facility] to provide your oncology and hematology care.  To afford each patient quality time with our provider, please arrive at least 15 minutes before your scheduled appointment time.   If you have a lab appointment with the [facility] please come in thru the  Main Entrance and check in at the main information desk  You need to re-schedule your appointment should you arrive 10 or more minutes late.  We strive to give you quality time with our providers, and arriving late affects you and other patients whose appointments are after yours.  Also, if you no show three or more times for appointments you may be dismissed from the clinic at the providers discretion.     Again, thank you for choosing [facility].  Our hope is that these requests will decrease the amount of time that you wait before being seen by our physicians.       _____________________________________________________________  Should you have questions after your visit to [facility], please contact our office at [phone] between the hours of 8:00 a.m. and 4:30 p.m.  Voicemails left after 4:00 p.m. will not be returned until the following business day.  For prescription refill requests, have your pharmacy contact our office and allow 72 hours.    Cancer Center Support Programs:   > Cancer Support Group  2nd Tuesday of the month 1pm-2pm, Journey Room

## 2018-07-28 NOTE — Assessment & Plan Note (Signed)
1.  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  Underwent modified most, diagnosed as a collision tumor (melanoma and carcinoma), likely metastatic.  Lymph nodes were clinically negative. -PET CT scan on 07/13/2017 shows hypermetabolic right lower lobe pulmonary nodule concerning for metastasis.  Additional pulmonary nodules of the right lung which are below resolution of the PET CT.  Focal highly hypermetabolic lesion of the descending colon, status post colonoscopy on 09/21/2017-tubulovillous, tubular and adenomatous polyps. - Underwent CT-guided right lower lobe lung biopsy on 10/05/2017-consistent with malignant melanoma. -Notes from Dr. Baltazar Najjar indicate BRAF V600 mutation was negative.  We will obtain reports of it. - We discussed about the normal prognosis of metastatic malignant melanoma.  We talked about treatment options including single agent ipilimumab/nivolumab  with the overall objective response rate of 40 to 45% and 4-year overall survival of 42%.  We also discussed response rates of combination immunotherapy with ipilimumab (3 mg/kg) and nivolumab (1 mg/kg) every 3 weeks for 4 doses followed by maintenance nivolumab to be around 60% with 3-year overall survival approaching 70%.  Overall survival in patients without a BRAF mutation is slightly lower. -PET CT scan dated 11/22/2017  showed at least 3 hypermetabolic right lung nodules and several additional 2 small to characterize nodules in both lungs.  Hypermetabolic metastasis in the lateral segment of the left lobe of liver noted.  At least 3 hypermetabolic peritoneal nodules identified.  Multifocal bone metastasis,  predominantly in the left humerus, thoracic vertebra, right ilium and right femur noted.  I have also reviewed the results of the brain MRI which did not show any obvious metastatic disease. - Combination immunotherapy with Yervoy and Opdivo from 12/08/2017 through 02/15/2018 (4 cycles) -PET/CT scan on 02/06/2018 showed marked improvement in the bilateral pulmonary nodules with decrease in size and no residual hypermetabolic 1.  Left liver nodule and left iliopsoas nodule have resolved.  Peritoneal nodules have also completely resolved.  Bone metastatic disease also looks better.  Anterior right subcutaneous nodule shows slight increase in size and increase in hypermetabolism. - Opdivo every 4 weeks started on 03/10/2018.  -He does not report any immunotherapy related side effects.  He has developed some rash on the left ankle few days ago.  Slightly itching.  I have told him to apply moisturizing lotion. -Physical examination today shows a vague palpable nodule in the right midclavicular line.  He refused prior biopsy. - PET CT scan on 07/24/2018 shows new focus of intense metabolic activity in the right upper lobe with SUV of 5.1.  It measures about 12 mm.  Nodule in the superior aspect of the right lower lobe measures 10 mm, decreased from 12 mm.  Right abdominal wall subcutaneous nodule measures 1.1 cm, previously 1.2 cm. -I have personally compared to prior PET CT scans.  New lung nodule in the right upper lobe was seen previously but was much smaller.  At this time we have decided to continue careful watching of this nodule.  We will continue Opdivo every 4 weeks. - MRI of the brain was ordered but was not done.  We will plan to perform it in the next 1 to 2 months.  He does not have any symptoms of  headaches or vision changes at this time.  2.  Atrial fibrillation: -We will continue Xarelto.  3.  Bone metastasis: -Denosumab monthly started on 03/10/2018. -he will continue calcium and vitamin D  supplements.

## 2018-07-28 NOTE — Progress Notes (Signed)
Christopher Baird, [place] [address]   CLINIC:  Medical Oncology/Hematology  PCP:  Tillman Abide, FNP [place] [id number] [phone]   REASON FOR VISIT:  Follow-up for metastatic melanoma to the right lung  CURRENT THERAPY:Opdivo every 4 weeks.   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, right ([place])   11/10/2017 Initial Diagnosis    Metastatic melanoma to lung, right ([place])    12/08/2017 -  Chemotherapy    The patient had ipilimumab (YERVOY) 350 mg in sodium chloride 0.9 % 150 mL chemo infusion, 3 mg/kg = 350 mg, Intravenous,  Once, 4 of 4 cycles Administration: 350 mg (12/08/2017), 350 mg (12/29/2017), 350 mg (01/20/2018), 350 mg (02/15/2018) nivolumab (OPDIVO) 116 mg in sodium chloride 0.9 % 100 mL chemo infusion, 1 mg/kg = 116 mg, Intravenous, Once, 10 of 12 cycles Dose modification: 480 mg (original dose 480 mg, Cycle 5, Reason: Other (see comments)) Administration: 116 mg (12/08/2017), 480 mg (03/10/2018), 116 mg (12/29/2017), 116 mg (01/20/2018), 100 mg (02/15/2018), 480 mg (04/07/2018), 480 mg (05/05/2018), 480 mg (06/02/2018), 480 mg (06/30/2018)  for chemotherapy treatment.       CANCER STAGING: Cancer Staging No matching staging information was found for the patient.   INTERVAL HISTORY:  Christopher Baird 73 y.o. male returns for routine follow-up and consideration for next cycle of chemotherapy. He is here today alone. He states that he has swelling in his feet. He states that he has some shortness of breath with activity. He has diarrhea at times as well. He states that he has noticed a new redness, rash on his left leg and ankle for a week or so.  Denies any nausea, vomiting, or diarrhea. Denies any new pains. Had not noticed any recent bleeding such as epistaxis, hematuria or hematochezia. Denies recent chest pain on exertion, shortness of breath on minimal exertion, pre-syncopal episodes, or palpitations.  Denies any numbness or tingling in hands or feet. Denies any recent fevers, infections, or recent hospitalizations. Patient reports appetite at 100% and energy level at 0%.     REVIEW OF SYSTEMS:  Review of Systems  Constitutional: Positive for fatigue.  Respiratory: Positive for shortness of breath.   Gastrointestinal: Positive for diarrhea.  Skin: Positive for rash.  Hematological: Bruises/bleeds easily.  Psychiatric/Behavioral: Positive for sleep disturbance.     PAST MEDICAL/SURGICAL HISTORY:  Past Medical History:  Diagnosis Date   Arthritis, degenerative    Bone metastases (A[facility]) 12/29/2017   Calculus of kidney    Gout, unspecified    Skin cancer    Unspecified essential hypertension    Past Surgical History:  Procedure Laterality Date   None       SOCIAL HISTORY:  Social History   Socioeconomic History   Marital status: Married    Spouse name: Not on file   Number of children: 1   Years of education: Not on file   Highest education level: Not on file  Occupational History   Occupation: Retired    Comment: [place] resource strain: Not hard at all   Food insecurity:    Worry: Never true    Inability: Never true   Transportation needs:    Medical: No    Non-medical: No  Tobacco Use   Smoking status: Never Smoker   Smokeless tobacco: Never Used  Substance and Sexual Activity   Alcohol use: No   Drug use: No  Sexual activity: Not on file  Lifestyle   Physical activity:    Days per week: 0 days    Minutes per session: 0 min   Stress: To some extent  Relationships   Social connections:    Talks on phone: More than three times a week    Gets together: Once a week    Attends religious service: 1 to 4 times per year    Active member of club or organization: No    Attends meetings of clubs or organizations: Never    Relationship status: Married   Intimate partner violence:     Fear of current or ex partner: No    Emotionally abused: No    Physically abused: No    Forced sexual activity: No  Other Topics Concern   Not on file  Social History Narrative   Not on file    FAMILY HISTORY:  Family History  Problem Relation Age of Onset   Heart disease Mother    Colon cancer Mother    Stroke Mother    Alcohol abuse Father        Cause of death is Suicide   Breast cancer Sister    Mental illness Paternal Aunt    Alcohol abuse Paternal Uncle     CURRENT MEDICATIONS:  Outpatient Encounter Medications as of 07/28/2018  Medication Sig   allopurinol (ZYLOPRIM) 300 MG tablet Take 300 mg by mouth daily.   atorvastatin (LIPITOR) 10 MG tablet Take 10 mg by mouth daily.   diltiazem (CARDIZEM CD) 240 MG 24 hr capsule TAKE 1 CAPSULE (240 MG TOTAL) BY MOUTH EVERY DAY   flurazepam (DALMANE) 30 MG capsule Take 30 mg by mouth at bedtime.   gabapentin (NEURONTIN) 300 MG capsule Take 300 mg by mouth 3 (three) times daily.   Ipilimumab (YERVOY IV) Inject into the vein every 21 ( twenty-one) days.    KLOR-CON M20 20 MEQ tablet TAKE 1 TABLET BY MOUTH EVERY DAY   lisinopril (PRINIVIL,ZESTRIL) 40 MG tablet Take 40 mg by mouth daily.    magnesium oxide (MAG-OX) 400 MG tablet Take 1 tablet (400 mg total) by mouth 2 (two) times daily.   metFORMIN (GLUCOPHAGE) 500 MG tablet Take 500 mg by mouth daily.   metoprolol succinate (TOPROL-XL) 50 MG 24 hr tablet TAKE 1 TABLET BY MOUTH TWICE A DAY WITH FOOD   Multiple Vitamin (MULTIVITAMIN) tablet Take 1 tablet by mouth daily.   Nivolumab (OPDIVO IV) Inject into the vein every 21 ( twenty-one) days.    prochlorperazine (COMPAZINE) 10 MG tablet Take 1 tablet (10 mg total) by mouth every 6 (six) hours as needed for nausea or vomiting.   rivaroxaban (XARELTO) 20 MG TABS tablet Take 1 tablet (20 mg total) by mouth daily with supper.   No facility-administered encounter medications on file as of 07/28/2018.     ALLERGIES:   No Known Allergies   PHYSICAL EXAM:  ECOG Performance status: 1  Vitals:   07/28/18 0927  BP: (!) 142/76  Pulse: 71  Resp: 20  Temp: (!) 97.4 F (36.3 C)  SpO2: 98%   Filed Weights   07/28/18 0927  Weight: 248 lb 14.4 oz (112.9 kg)    Physical Exam Vitals signs reviewed.  Constitutional:      Appearance: Normal appearance.  Cardiovascular:     Rate and Rhythm: Normal rate and regular rhythm.     Heart sounds: Normal heart sounds.  Pulmonary:     Effort: Pulmonary effort is normal.  Breath sounds: Normal breath sounds.  Abdominal:     General: There is no distension.     Palpations: Abdomen is soft. There is no mass.  Musculoskeletal:        General: No swelling.  Skin:    General: Skin is warm.     Findings: Rash present.  Neurological:     General: No focal deficit present.     Mental Status: He is alert and oriented to person, place, and time.  Psychiatric:        Mood and Affect: Mood normal.        Behavior: Behavior normal.      LABORATORY DATA:  I have reviewed the labs as listed.  CBC    Component Value Date/Time   WBC 8.2 07/28/2018 0849   RBC 4.67 07/28/2018 0849   HGB 13.3 07/28/2018 0849   HCT 43.4 07/28/2018 0849   PLT 206 07/28/2018 0849   MCV 92.9 07/28/2018 0849   MCH 28.5 07/28/2018 0849   MCHC 30.6 07/28/2018 0849   RDW 15.6 (H) 07/28/2018 0849   LYMPHSABS 2.3 07/28/2018 0849   MONOABS 0.7 07/28/2018 0849   EOSABS 0.2 07/28/2018 0849   BASOSABS 0.0 07/28/2018 0849   CMP Latest Ref Rng & Units 07/28/2018 06/30/2018 06/02/2018  Glucose 70 - 99 mg/dL 131(H) 113(H) 123(H)  BUN 8 - 23 mg/dL 16 16 21   Creatinine 0.61 - 1.24 mg/dL 1.25(H) 1.32(H) 1.27(H)  Sodium 135 - 145 mmol/L 141 139 138  Potassium 3.5 - 5.1 mmol/L 4.6 4.5 4.7  Chloride 98 - 111 mmol/L 106 108 107  CO2 22 - 32 mmol/L 26 22 22   Calcium 8.9 - 10.3 mg/dL 9.2 8.9 9.5  Total Protein 6.5 - 8.1 g/dL 7.2 7.3 7.1  Total Bilirubin 0.3 - 1.2 mg/dL 1.1 0.8 0.7  Alkaline  Phos 38 - 126 U/L 69 73 75  AST 15 - 41 U/L 23 21 21   ALT 0 - 44 U/L 15 14 13        DIAGNOSTIC IMAGING:  I have independently reviewed the scans and discussed with the patient.   I have reviewed Venita Lick LPN's note and agree with the documentation.  I personally performed a face-to-face visit, made revisions and my assessment and plan is as follows.    ASSESSMENT & PLAN:   Metastatic melanoma to lung, right ([place]) 1.  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  Underwent modified most, diagnosed as a collision tumor (melanoma and carcinoma), likely metastatic.  Lymph nodes were clinically negative. -PET CT scan on 07/13/2017 shows hypermetabolic right lower lobe pulmonary nodule concerning for metastasis.  Additional pulmonary nodules of the right lung which are below resolution of the PET CT.  Focal highly hypermetabolic lesion of the descending colon, status post colonoscopy on 09/21/2017-tubulovillous, tubular and adenomatous polyps. - Underwent CT-guided right lower lobe lung biopsy on 10/05/2017-consistent with malignant melanoma. -Notes from Dr. Baltazar Najjar indicate BRAF V600 mutation was negative.  We will obtain reports of it. - We discussed about the normal prognosis of metastatic malignant melanoma.  We talked about treatment options including single agent ipilimumab/nivolumab  with the overall objective response rate of 40 to 45% and 4-year overall survival  of 42%.  We also discussed response rates of combination immunotherapy with ipilimumab (3 mg/kg) and nivolumab (1 mg/kg) every 3 weeks for 4 doses followed by maintenance nivolumab to be around 60% with  3-year overall survival approaching 70%.  Overall survival in patients without a BRAF mutation is slightly lower. -PET CT scan dated 11/22/2017  showed at least 3 hypermetabolic right lung nodules and several additional 2 small to characterize nodules in both lungs.  Hypermetabolic metastasis in the lateral segment of the left lobe of liver noted.  At least 3 hypermetabolic peritoneal nodules identified.  Multifocal bone metastasis, predominantly in the left humerus, thoracic vertebra, right ilium and right femur noted.  I have also reviewed the results of the brain MRI which did not show any obvious metastatic disease. - Combination immunotherapy with Yervoy and Opdivo from 12/08/2017 through 02/15/2018 (4 cycles) -PET/CT scan on 02/06/2018 showed marked improvement in the bilateral pulmonary nodules with decrease in size and no residual hypermetabolic 1.  Left liver nodule and left iliopsoas nodule have resolved.  Peritoneal nodules have also completely resolved.  Bone metastatic disease also looks better.  Anterior right subcutaneous nodule shows slight increase in size and increase in hypermetabolism. - Opdivo every 4 weeks started on 03/10/2018.  -He does not report any immunotherapy related side effects.  He has developed some rash on the left ankle few days ago.  Slightly itching.  I have told him to apply moisturizing lotion. -Physical examination today shows a vague palpable nodule in the right midclavicular line.  He refused prior biopsy. - PET CT scan on 07/24/2018 shows new focus of intense metabolic activity in the right upper lobe with SUV of 5.1.  It measures about 12 mm.  Nodule in the superior aspect of the right lower lobe measures 10 mm, decreased from 12 mm.  Right abdominal wall subcutaneous nodule measures 1.1 cm, previously 1.2 cm. -I have personally compared to prior PET CT scans.  New lung nodule in the right upper lobe was seen previously but was much smaller.  At this time we have  decided to continue careful watching of this nodule.  We will continue Opdivo every 4 weeks. - MRI of the brain was ordered but was not done.  We will plan to perform it in the next 1 to 2 months.  He does not have any symptoms of headaches or vision changes at this time.  2.  Atrial fibrillation: -We will continue Xarelto.  3.  Bone metastasis: -Denosumab monthly started on 03/10/2018. -he will continue calcium and vitamin D supplements.    Total time spent is 25 minutes with more than 50% of the time spent face-to-face discussing scan results, further surveillance plan and coordination of care.    Orders placed this encounter:  Orders Placed This Encounter  Procedures   CBC with Differential/Platelet   Comprehensive metabolic panel   Lactate dehydrogenase      Derek Jack, MD [place] [phone]

## 2018-07-28 NOTE — Progress Notes (Signed)
Treatment given today per MD orders. Tolerated infusion without adverse affects. Vital signs stable. No complaints at this time. Discharged from clinic ambulatory. F/U with [facility] as scheduled.   

## 2018-07-28 NOTE — Patient Instructions (Signed)
[facility] Discharge Instructions for Patients Receiving Chemotherapy  Today you received the following chemotherapy agents   To help prevent nausea and vomiting after your treatment, we encourage you to take your nausea medication   If you develop nausea and vomiting that is not controlled by your nausea medication, call the clinic.   BELOW ARE SYMPTOMS THAT SHOULD BE REPORTED IMMEDIATELY:  *FEVER GREATER THAN 100.5 F  *CHILLS WITH OR WITHOUT FEVER  NAUSEA AND VOMITING THAT IS NOT CONTROLLED WITH YOUR NAUSEA MEDICATION  *UNUSUAL SHORTNESS OF BREATH  *UNUSUAL BRUISING OR BLEEDING  TENDERNESS IN MOUTH AND THROAT WITH OR WITHOUT PRESENCE OF ULCERS  *URINARY PROBLEMS  *BOWEL PROBLEMS  UNUSUAL RASH Items with * indicate a potential emergency and should be followed up as soon as possible.  Feel free to call the clinic should you have any questions or concerns. The clinic phone number is [phone].  Please show the CHEMO ALERT CARD at check-in to the Emergency Department and triage nurse.   

## 2018-08-11 ENCOUNTER — Other Ambulatory Visit (HOSPITAL_COMMUNITY): Payer: Self-pay | Admitting: Nurse Practitioner

## 2018-08-11 DIAGNOSIS — C7801 Secondary malignant neoplasm of right lung: Secondary | ICD-10-CM

## 2018-08-24 ENCOUNTER — Other Ambulatory Visit: Payer: Self-pay

## 2018-08-24 ENCOUNTER — Other Ambulatory Visit (HOSPITAL_COMMUNITY): Payer: Self-pay | Admitting: Nurse Practitioner

## 2018-08-24 ENCOUNTER — Telehealth (HOSPITAL_COMMUNITY): Payer: Self-pay | Admitting: Emergency Medicine

## 2018-08-25 ENCOUNTER — Inpatient Hospital Stay (HOSPITAL_BASED_OUTPATIENT_CLINIC_OR_DEPARTMENT_OTHER): Payer: Medicare Other | Admitting: Hematology

## 2018-08-25 ENCOUNTER — Inpatient Hospital Stay (HOSPITAL_COMMUNITY): Payer: Medicare Other

## 2018-08-25 ENCOUNTER — Inpatient Hospital Stay (HOSPITAL_COMMUNITY): Payer: Medicare Other | Attending: Hematology

## 2018-08-25 VITALS — BP 121/63 | HR 62 | Temp 98.0°F | Resp 18

## 2018-08-25 DIAGNOSIS — Z8249 Family history of ischemic heart disease and other diseases of the circulatory system: Secondary | ICD-10-CM

## 2018-08-25 DIAGNOSIS — Z803 Family history of malignant neoplasm of breast: Secondary | ICD-10-CM | POA: Insufficient documentation

## 2018-08-25 DIAGNOSIS — C434 Malignant melanoma of scalp and neck: Secondary | ICD-10-CM | POA: Insufficient documentation

## 2018-08-25 DIAGNOSIS — E119 Type 2 diabetes mellitus without complications: Secondary | ICD-10-CM | POA: Diagnosis not present

## 2018-08-25 DIAGNOSIS — R197 Diarrhea, unspecified: Secondary | ICD-10-CM

## 2018-08-25 DIAGNOSIS — Z79899 Other long term (current) drug therapy: Secondary | ICD-10-CM | POA: Diagnosis not present

## 2018-08-25 DIAGNOSIS — I4891 Unspecified atrial fibrillation: Secondary | ICD-10-CM | POA: Diagnosis not present

## 2018-08-25 DIAGNOSIS — Z5112 Encounter for antineoplastic immunotherapy: Secondary | ICD-10-CM | POA: Diagnosis present

## 2018-08-25 DIAGNOSIS — C7801 Secondary malignant neoplasm of right lung: Secondary | ICD-10-CM | POA: Insufficient documentation

## 2018-08-25 DIAGNOSIS — I10 Essential (primary) hypertension: Secondary | ICD-10-CM

## 2018-08-25 DIAGNOSIS — Z7901 Long term (current) use of anticoagulants: Secondary | ICD-10-CM | POA: Insufficient documentation

## 2018-08-25 DIAGNOSIS — Z85828 Personal history of other malignant neoplasm of skin: Secondary | ICD-10-CM

## 2018-08-25 DIAGNOSIS — Z7984 Long term (current) use of oral hypoglycemic drugs: Secondary | ICD-10-CM

## 2018-08-25 DIAGNOSIS — C7951 Secondary malignant neoplasm of bone: Secondary | ICD-10-CM

## 2018-08-25 DIAGNOSIS — C787 Secondary malignant neoplasm of liver and intrahepatic bile duct: Secondary | ICD-10-CM | POA: Diagnosis not present

## 2018-08-25 LAB — CBC WITH DIFFERENTIAL/PLATELET
Abs Immature Granulocytes: 0.03 10*3/uL (ref 0.00–0.07)
Basophils Absolute: 0 10*3/uL (ref 0.0–0.1)
Basophils Relative: 1 %
Eosinophils Absolute: 0.2 10*3/uL (ref 0.0–0.5)
Eosinophils Relative: 2 %
HCT: 42.9 % (ref 39.0–52.0)
Hemoglobin: 13.1 g/dL (ref 13.0–17.0)
Immature Granulocytes: 0 %
Lymphocytes Relative: 34 %
Lymphs Abs: 2.7 10*3/uL (ref 0.7–4.0)
MCH: 28.4 pg (ref 26.0–34.0)
MCHC: 30.5 g/dL (ref 30.0–36.0)
MCV: 92.9 fL (ref 80.0–100.0)
Monocytes Absolute: 0.7 10*3/uL (ref 0.1–1.0)
Monocytes Relative: 9 %
Neutro Abs: 4.3 10*3/uL (ref 1.7–7.7)
Neutrophils Relative %: 54 %
Platelets: 215 10*3/uL (ref 150–400)
RBC: 4.62 MIL/uL (ref 4.22–5.81)
RDW: 15.8 % — ABNORMAL HIGH (ref 11.5–15.5)
WBC: 7.9 10*3/uL (ref 4.0–10.5)
nRBC: 0 % (ref 0.0–0.2)

## 2018-08-25 LAB — COMPREHENSIVE METABOLIC PANEL
ALT: 15 U/L (ref 0–44)
AST: 21 U/L (ref 15–41)
Albumin: 3.8 g/dL (ref 3.5–5.0)
Alkaline Phosphatase: 69 U/L (ref 38–126)
Anion gap: 11 (ref 5–15)
BUN: 19 mg/dL (ref 8–23)
CO2: 23 mmol/L (ref 22–32)
Calcium: 9.1 mg/dL (ref 8.9–10.3)
Chloride: 107 mmol/L (ref 98–111)
Creatinine, Ser: 1.31 mg/dL — ABNORMAL HIGH (ref 0.61–1.24)
GFR calc Af Amer: >60 mL/min (ref >60)
GFR calc non Af Amer: 53 mL/min — ABNORMAL LOW (ref >60)
Glucose, Bld: 103 mg/dL — ABNORMAL HIGH (ref 70–99)
Potassium: 4.7 mmol/L (ref 3.5–5.1)
Sodium: 141 mmol/L (ref 135–145)
Total Bilirubin: 0.6 mg/dL (ref 0.3–1.2)
Total Protein: 7 g/dL (ref 6.5–8.1)

## 2018-08-25 LAB — LACTATE DEHYDROGENASE: LDH: 152 U/L (ref 98–192)

## 2018-08-25 MED ORDER — DENOSUMAB 120 MG/1.7ML ~~LOC~~ SOLN
120.0000 mg | Freq: Once | SUBCUTANEOUS | Status: AC
  Administered 2018-08-25: 120 mg via SUBCUTANEOUS

## 2018-08-25 MED ORDER — SODIUM CHLORIDE 0.9 % IV SOLN
Freq: Once | INTRAVENOUS | Status: AC
  Administered 2018-08-25: 10:00:00 via INTRAVENOUS

## 2018-08-25 MED ORDER — DENOSUMAB 120 MG/1.7ML ~~LOC~~ SOLN
SUBCUTANEOUS | Status: AC
  Filled 2018-08-25: qty 1.7

## 2018-08-25 MED ORDER — SODIUM CHLORIDE 0.9 % IV SOLN
480.0000 mg | Freq: Once | INTRAVENOUS | Status: AC
  Administered 2018-08-25: 480 mg via INTRAVENOUS
  Filled 2018-08-25: qty 48

## 2018-08-25 NOTE — Progress Notes (Signed)
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, right ([place])   12/08/2017 -  Chemotherapy   The patient had ipilimumab (YERVOY) 350 mg in sodium chloride 0.9 % 150 mL chemo infusion, 3 mg/kg = 350 mg, Intravenous,  Once, 4 of 4 cycles Administration: 350 mg (12/08/2017), 350 mg (12/29/2017), 350 mg (01/20/2018), 350 mg (02/15/2018) nivolumab (OPDIVO) 116 mg in sodium chloride 0.9 % 100 mL chemo infusion, 1 mg/kg = 116 mg, Intravenous, Once, 11 of 12 cycles Dose modification: 480 mg (original dose 480 mg, Cycle 5, Reason: Other (see comments)) Administration: 116 mg (12/08/2017), 480 mg (03/10/2018), 116 mg (12/29/2017), 116 mg (01/20/2018), 100 mg (02/15/2018), 480 mg (04/07/2018), 480 mg (05/05/2018), 480 mg (06/02/2018), 480 mg (06/30/2018), 480 mg (07/28/2018)  for chemotherapy treatment.       INTERVAL HISTORY:  Christopher Baird 73 y.o. male presents today for follow-up.  Reports overall doing well.  Denies any significant fatigue.  Reports diarrhea is mild and is controlled with as needed Imodium.  Appetite is adequate.  He is able to complete all ADLs independently.  Denies any new cough.  Denies shortness of breath.  He states he is ready to proceed with treatment today.   REVIEW OF SYSTEMS:  Review of Systems  Constitutional: Negative.   HENT:  Negative.   Eyes: Negative.   Respiratory: Negative.   Cardiovascular: Negative.   Gastrointestinal: Positive for diarrhea.  Endocrine: Negative.   Genitourinary: Negative.    Musculoskeletal: Negative.   Skin: Negative.   Neurological: Negative.   Hematological: Negative.    Psychiatric/Behavioral: Negative.      PAST MEDICAL/SURGICAL HISTORY:  Past Medical History:  Diagnosis Date  . Arthritis, degenerative   . Bone metastases ([facility]) 12/29/2017  . Calculus of kidney   . Gout, unspecified   . Skin cancer   . Unspecified essential hypertension    Past Surgical History:  Procedure Laterality Date  . None       SOCIAL HISTORY:  Social History   Socioeconomic History  . Marital status: Married    Spouse name: Not on file  . Number of children: 1  . Years of education: Not on file  . Highest education level: Not on file  Occupational History  . Occupation: Retired    Comment: Chief Financial Officer  Social Needs  . Financial resource strain: Not hard at all  . Food insecurity    Worry: Never true    Inability: Never true  . Transportation needs    Medical: No    Non-medical: No  Tobacco Use  . Smoking status: Never Smoker  . Smokeless tobacco: Never Used  Substance and Sexual Activity  . Alcohol use: No  . Drug use: No  . Sexual activity: Not on file  Lifestyle  . Physical activity    Days per week: 0 days    Minutes per session: 0 min  . Stress: To some extent  Relationships  . Social connections    Talks on phone: More than three times a week    Gets together: Once a week    Attends religious service: 1 to 4 times per  year    Active member of club or organization: No    Attends meetings of clubs or organizations: Never    Relationship status: Married  . Intimate partner violence    Fear of current or ex partner: No    Emotionally abused: No    Physically abused: No    Forced sexual activity: No  Other Topics Concern  . Not on file  Social History Narrative  . Not on file    FAMILY HISTORY:  Family History  Problem Relation Age of Onset  . Heart disease Mother   . Colon cancer Mother   . Stroke Mother   . Alcohol abuse Father        Cause of death is Suicide  . Breast cancer Sister   . Mental illness  Paternal Aunt   . Alcohol abuse Paternal Uncle     CURRENT MEDICATIONS:  Outpatient Encounter Medications as of 08/25/2018  Medication Sig  . allopurinol (ZYLOPRIM) 300 MG tablet Take 300 mg by mouth daily.  Marland Kitchen atorvastatin (LIPITOR) 10 MG tablet Take 10 mg by mouth daily.  Marland Kitchen diltiazem (CARDIZEM CD) 240 MG 24 hr capsule TAKE 1 CAPSULE (240 MG TOTAL) BY MOUTH EVERY DAY  . flurazepam (DALMANE) 30 MG capsule Take 30 mg by mouth at bedtime.  . gabapentin (NEURONTIN) 300 MG capsule Take 300 mg by mouth 3 (three) times daily.  Marland Kitchen Ipilimumab (YERVOY IV) Inject into the vein every 21 ( twenty-one) days.   Marland Kitchen KLOR-CON M20 20 MEQ tablet TAKE 1 TABLET BY MOUTH EVERY DAY  . lisinopril (PRINIVIL,ZESTRIL) 40 MG tablet Take 40 mg by mouth daily.   . magnesium oxide (MAG-OX) 400 MG tablet Take 1 tablet (400 mg total) by mouth 2 (two) times daily.  . metFORMIN (GLUCOPHAGE) 500 MG tablet Take 500 mg by mouth daily.  . metoprolol succinate (TOPROL-XL) 50 MG 24 hr tablet TAKE 1 TABLET BY MOUTH TWICE A DAY WITH FOOD  . Multiple Vitamin (MULTIVITAMIN) tablet Take 1 tablet by mouth daily.  . Nivolumab (OPDIVO IV) Inject into the vein every 21 ( twenty-one) days.   . prochlorperazine (COMPAZINE) 10 MG tablet Take 1 tablet (10 mg total) by mouth every 6 (six) hours as needed for nausea or vomiting.  . rivaroxaban (XARELTO) 20 MG TABS tablet Take 1 tablet (20 mg total) by mouth daily with supper.   No facility-administered encounter medications on file as of 08/25/2018.     ALLERGIES:  No Known Allergies   PHYSICAL EXAM:  ECOG Performance status: 1  There were no vitals filed for this visit. There were no vitals filed for this visit.  Physical Exam Constitutional:      Appearance: Normal appearance. He is obese.  HENT:     Head: Normocephalic.     Nose: Nose normal.     Mouth/Throat:     Mouth: Mucous membranes are moist.     Pharynx: Oropharynx is clear.  Eyes:     Conjunctiva/sclera: Conjunctivae  normal.     Pupils: Pupils are equal, round, and reactive to light.  Neck:     Musculoskeletal: Normal range of motion.  Cardiovascular:     Rate and Rhythm: Normal rate and regular rhythm.     Pulses: Normal pulses.  Pulmonary:     Effort: Pulmonary effort is normal.     Breath sounds: Normal breath sounds.  Abdominal:     General: Bowel sounds are normal.     Palpations: Abdomen is soft.  Musculoskeletal:  Normal range of motion.  Skin:    General: Skin is warm and dry.  Neurological:     General: No focal deficit present.     Mental Status: He is alert and oriented to person, place, and time.  Psychiatric:        Mood and Affect: Mood normal.        Behavior: Behavior normal.        Thought Content: Thought content normal.        Judgment: Judgment normal.      LABORATORY DATA:  I have reviewed the labs as listed.  CBC    Component Value Date/Time   WBC 7.9 08/25/2018 0857   RBC 4.62 08/25/2018 0857   HGB 13.1 08/25/2018 0857   HCT 42.9 08/25/2018 0857   PLT 215 08/25/2018 0857   MCV 92.9 08/25/2018 0857   MCH 28.4 08/25/2018 0857   MCHC 30.5 08/25/2018 0857   RDW 15.8 (H) 08/25/2018 0857   LYMPHSABS 2.7 08/25/2018 0857   MONOABS 0.7 08/25/2018 0857   EOSABS 0.2 08/25/2018 0857   BASOSABS 0.0 08/25/2018 0857   CMP Latest Ref Rng & Units 08/25/2018 07/28/2018 06/30/2018  Glucose 70 - 99 mg/dL 103(H) 131(H) 113(H)  BUN 8 - 23 mg/dL _0 Creatinine 0.61 - 1.24 mg/dL 1.31(H) 1.25(H) 1.32(H)  Sodium 135 - 145 mmol/L 141 141 139  Potassium 3.5 - 5.1 mmol/L 4.7 4.6 4.5  Chloride 98 - 111 mmol/L 107 106 108  CO2 22 - 32 mmol/L _1 Calcium 8.9 - 10.3 mg/dL 9.1 9.2 8.9  Total Protein 6.5 - 8.1 g/dL 7.0 7.2 7.3  Total Bilirubin 0.3 - 1.2 mg/dL 0.6 1.1 0.8  Alkaline Phos 38 - 126 U/L 69 69 73  AST 15 - 41 U/L _2 ALT 0 - 44 U/L _3 ASSESSMENT & PLAN:   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  Metastatic melanoma to the lung, BRAF  negative: - History of melanoma of the scalp vertex treated with Mohs surgery in November 7867, uncertain staging as no access to pathology. 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  Underwent modified most, diagnosed as a collision tumor (melanoma and carcinoma), likely metastatic.  Lymph nodes were clinically negative. -PET CT scan on 07/13/2017 shows hypermetabolic right lower lobe pulmonary nodule concerning for metastasis.  Additional pulmonary nodules of the right lung which are below resolution of the PET CT.  Focal highly hypermetabolic lesion of the descending colon, status post colonoscopy on 09/21/2017-tubulovillous, tubular and adenomatous polyps. - Underwent CT-guided right lower lobe lung biopsy on 10/05/2017-consistent with malignant melanoma. -Notes from Dr. Baltazar Najjar indicate BRAF V600 mutation was negative.  We will obtain reports of it. - We discussed about the normal prognosis of metastatic malignant melanoma.  We talked about treatment options including single agent ipilimumab/nivolumab  with the overall objective response rate of 40 to 45% and 4-year overall survival of 42%.  We also discussed response rates of combination immunotherapy with ipilimumab (3 mg/kg) and nivolumab (1 mg/kg) every 3 weeks for 4 doses followed by maintenance nivolumab to be around 60% with 3-year overall survival approaching 70%.  Overall survival in patients without a BRAF mutation is slightly lower. -PET CT scan dated 11/22/2017  showed at least 3 hypermetabolic right lung nodules and several  additional 2 small to characterize nodules in both lungs.  Hypermetabolic metastasis in the lateral segment of the left lobe of liver noted.  At least 3 hypermetabolic peritoneal nodules identified.  Multifocal bone metastasis, predominantly in the left humerus, thoracic  vertebra, right ilium and right femur noted.  I have also reviewed the results of the brain MRI which did not show any obvious metastatic disease. - Combination immunotherapy with Yervoy and Opdivo from 12/08/2017 through 02/15/2018 (4 cycles) -PET/CT scan on 02/06/2018 showed marked improvement in the bilateral pulmonary nodules with decrease in size and no residual hypermetabolic 1.  Left liver nodule and left iliopsoas nodule have resolved.  Peritoneal nodules have also completely resolved.  Bone metastatic disease also looks better.  Anterior right subcutaneous nodule shows slight increase in size and increase in hypermetabolism. - Opdivo every 4 weeks started on 03/10/2018.  -He does not report any immunotherapy related side effects.  He has developed some rash on the left ankle few days ago.  Slightly itching.  I have told him to apply moisturizing lotion. -Physical examination today shows a vague palpable nodule in the right midclavicular line.  He refused prior biopsy. - PET CT scan on 07/24/2018 shows new focus of intense metabolic activity in the right upper lobe with SUV of 5.1.  It measures about 12 mm.  Nodule in the superior aspect of the right lower lobe measures 10 mm, decreased from 12 mm.  Right abdominal wall subcutaneous nodule measures 1.1 cm, previously 1.2 cm. -I have personally compared to prior PET CT scans.  New lung nodule in the right upper lobe was seen previously but was much smaller.  At this time we have decided to continue careful watching of this nodule.  We will continue Opdivo every 4 weeks. - MRI of the brain was ordered but was not done.  We will plan to perform it in the next 1 to 2 months.  He does not have any symptoms of headaches or vision changes at this time. - Labs are acceptable to proceed with treatment today. - RTC in 4 weeks.   2.  Atrial fibrillation: -We will continue Xarelto.  3.  Bone metastasis: -Denosumab monthly started on 03/10/2018. -he will  continue calcium and vitamin D supplements.        Orders placed this encounter:  Orders Placed This Encounter  Procedures  . CBC with Differential  . Comprehensive metabolic panel  . Lactate dehydrogenase      [place] [phone]

## 2018-08-25 NOTE — Patient Instructions (Signed)
[facility] Discharge Instructions for Patients Receiving Chemotherapy  Today you received the following chemotherapy agents   To help prevent nausea and vomiting after your treatment, we encourage you to take your nausea medication   If you develop nausea and vomiting that is not controlled by your nausea medication, call the clinic.   BELOW ARE SYMPTOMS THAT SHOULD BE REPORTED IMMEDIATELY:  *FEVER GREATER THAN 100.5 F  *CHILLS WITH OR WITHOUT FEVER  NAUSEA AND VOMITING THAT IS NOT CONTROLLED WITH YOUR NAUSEA MEDICATION  *UNUSUAL SHORTNESS OF BREATH  *UNUSUAL BRUISING OR BLEEDING  TENDERNESS IN MOUTH AND THROAT WITH OR WITHOUT PRESENCE OF ULCERS  *URINARY PROBLEMS  *BOWEL PROBLEMS  UNUSUAL RASH Items with * indicate a potential emergency and should be followed up as soon as possible.  Feel free to call the clinic should you have any questions or concerns. The clinic phone number is [phone].  Please show the CHEMO ALERT CARD at check-in to the Emergency Department and triage nurse.   

## 2018-08-25 NOTE — Progress Notes (Signed)
Treatment given today per MD orders. Tolerated infusion without adverse affects. Vital signs stable. No complaints at this time. Discharged from clinic ambulatory. F/U with [facility] as scheduled.   

## 2018-08-25 NOTE — Assessment & Plan Note (Signed)
1.  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  Underwent modified most, diagnosed as a collision tumor (melanoma and carcinoma), likely metastatic.  Lymph nodes were clinically negative. -PET CT scan on 07/13/2017 shows hypermetabolic right lower lobe pulmonary nodule concerning for metastasis.  Additional pulmonary nodules of the right lung which are below resolution of the PET CT.  Focal highly hypermetabolic lesion of the descending colon, status post colonoscopy on 09/21/2017-tubulovillous, tubular and adenomatous polyps. - Underwent CT-guided right lower lobe lung biopsy on 10/05/2017-consistent with malignant melanoma. -Notes from Dr. Baltazar Najjar indicate BRAF V600 mutation was negative.  We will obtain reports of it. - We discussed about the normal prognosis of metastatic malignant melanoma.  We talked about treatment options including single agent ipilimumab/nivolumab  with the overall objective response rate of 40 to 45% and 4-year overall survival of 42%.  We also discussed response rates of combination immunotherapy with ipilimumab (3 mg/kg) and nivolumab (1 mg/kg) every 3 weeks for 4 doses followed by maintenance nivolumab to be around 60% with 3-year overall survival approaching 70%.  Overall survival in patients without a BRAF mutation is slightly lower. -PET CT scan dated 11/22/2017  showed at least 3 hypermetabolic right lung nodules and several additional 2 small to characterize nodules in both lungs.  Hypermetabolic metastasis in the lateral segment of the left lobe of liver noted.  At least 3 hypermetabolic peritoneal nodules identified.  Multifocal bone metastasis,  predominantly in the left humerus, thoracic vertebra, right ilium and right femur noted.  I have also reviewed the results of the brain MRI which did not show any obvious metastatic disease. - Combination immunotherapy with Yervoy and Opdivo from 12/08/2017 through 02/15/2018 (4 cycles) -PET/CT scan on 02/06/2018 showed marked improvement in the bilateral pulmonary nodules with decrease in size and no residual hypermetabolic 1.  Left liver nodule and left iliopsoas nodule have resolved.  Peritoneal nodules have also completely resolved.  Bone metastatic disease also looks better.  Anterior right subcutaneous nodule shows slight increase in size and increase in hypermetabolism. - Opdivo every 4 weeks started on 03/10/2018.  -He does not report any immunotherapy related side effects.  He has developed some rash on the left ankle few days ago.  Slightly itching.  I have told him to apply moisturizing lotion. -Physical examination today shows a vague palpable nodule in the right midclavicular line.  He refused prior biopsy. - PET CT scan on 07/24/2018 shows new focus of intense metabolic activity in the right upper lobe with SUV of 5.1.  It measures about 12 mm.  Nodule in the superior aspect of the right lower lobe measures 10 mm, decreased from 12 mm.  Right abdominal wall subcutaneous nodule measures 1.1 cm, previously 1.2 cm. -I have personally compared to prior PET CT scans.  New lung nodule in the right upper lobe was seen previously but was much smaller.  At this time we have decided to continue careful watching of this nodule.  We will continue Opdivo every 4 weeks. - MRI of the brain was ordered but was not done.  We will plan to perform it in the next 1 to 2 months.  He does not have any symptoms of  headaches or vision changes at this time. - Labs are acceptable to proceed with treatment today. - RTC in 4 weeks.   2.  Atrial fibrillation: -We will continue Xarelto.  3.  Bone metastasis: -Denosumab  monthly started on 03/10/2018. -he will continue calcium and vitamin D supplements.

## 2018-09-04 ENCOUNTER — Other Ambulatory Visit (HOSPITAL_COMMUNITY): Payer: Self-pay | Admitting: Nurse Practitioner

## 2018-09-04 DIAGNOSIS — C7801 Secondary malignant neoplasm of right lung: Secondary | ICD-10-CM

## 2018-09-14 ENCOUNTER — Ambulatory Visit (HOSPITAL_COMMUNITY)
Admission: RE | Admit: 2018-09-14 | Discharge: 2018-09-14 | Disposition: A | Discharge status: Home / Self Care | Payer: Medicare Other | Source: Ambulatory Visit | Attending: Nurse Practitioner | Admitting: Nurse Practitioner

## 2018-09-14 ENCOUNTER — Other Ambulatory Visit: Payer: Self-pay

## 2018-09-14 DIAGNOSIS — C7801 Secondary malignant neoplasm of right lung: Secondary | ICD-10-CM | POA: Diagnosis present

## 2018-09-14 MED ORDER — GADOBUTROL 1 MMOL/ML IV SOLN
10.0000 mL | Freq: Once | INTRAVENOUS | Status: AC | PRN
  Administered 2018-09-14: 10 mL via INTRAVENOUS

## 2018-09-18 ENCOUNTER — Other Ambulatory Visit: Payer: Self-pay | Admitting: *Deleted

## 2018-09-18 MED ORDER — RIVAROXABAN 20 MG PO TABS: 20.0000 mg | ORAL_TABLET | Freq: Every day | ORAL | 1 refills | Status: DC

## 2018-09-22 ENCOUNTER — Inpatient Hospital Stay (HOSPITAL_COMMUNITY): Payer: Medicare Other

## 2018-09-22 ENCOUNTER — Other Ambulatory Visit: Payer: Self-pay

## 2018-09-22 ENCOUNTER — Inpatient Hospital Stay (HOSPITAL_COMMUNITY): Payer: Medicare Other | Attending: Hematology

## 2018-09-22 ENCOUNTER — Inpatient Hospital Stay (HOSPITAL_BASED_OUTPATIENT_CLINIC_OR_DEPARTMENT_OTHER): Payer: Medicare Other | Admitting: Hematology

## 2018-09-22 VITALS — BP 125/66 | HR 66 | Temp 98.6°F | Resp 16

## 2018-09-22 DIAGNOSIS — M109 Gout, unspecified: Secondary | ICD-10-CM | POA: Diagnosis not present

## 2018-09-22 DIAGNOSIS — C787 Secondary malignant neoplasm of liver and intrahepatic bile duct: Secondary | ICD-10-CM | POA: Diagnosis not present

## 2018-09-22 DIAGNOSIS — Z803 Family history of malignant neoplasm of breast: Secondary | ICD-10-CM | POA: Diagnosis not present

## 2018-09-22 DIAGNOSIS — I4891 Unspecified atrial fibrillation: Secondary | ICD-10-CM | POA: Insufficient documentation

## 2018-09-22 DIAGNOSIS — C7801 Secondary malignant neoplasm of right lung: Secondary | ICD-10-CM | POA: Insufficient documentation

## 2018-09-22 DIAGNOSIS — E785 Hyperlipidemia, unspecified: Secondary | ICD-10-CM | POA: Diagnosis not present

## 2018-09-22 DIAGNOSIS — Z85828 Personal history of other malignant neoplasm of skin: Secondary | ICD-10-CM | POA: Insufficient documentation

## 2018-09-22 DIAGNOSIS — C7951 Secondary malignant neoplasm of bone: Secondary | ICD-10-CM | POA: Insufficient documentation

## 2018-09-22 DIAGNOSIS — R5383 Other fatigue: Secondary | ICD-10-CM

## 2018-09-22 DIAGNOSIS — C434 Malignant melanoma of scalp and neck: Secondary | ICD-10-CM | POA: Insufficient documentation

## 2018-09-22 DIAGNOSIS — Z79899 Other long term (current) drug therapy: Secondary | ICD-10-CM | POA: Insufficient documentation

## 2018-09-22 DIAGNOSIS — Z7984 Long term (current) use of oral hypoglycemic drugs: Secondary | ICD-10-CM | POA: Diagnosis not present

## 2018-09-22 DIAGNOSIS — I10 Essential (primary) hypertension: Secondary | ICD-10-CM

## 2018-09-22 DIAGNOSIS — E119 Type 2 diabetes mellitus without complications: Secondary | ICD-10-CM

## 2018-09-22 DIAGNOSIS — Z8249 Family history of ischemic heart disease and other diseases of the circulatory system: Secondary | ICD-10-CM | POA: Insufficient documentation

## 2018-09-22 DIAGNOSIS — Z7901 Long term (current) use of anticoagulants: Secondary | ICD-10-CM | POA: Insufficient documentation

## 2018-09-22 DIAGNOSIS — Z5112 Encounter for antineoplastic immunotherapy: Secondary | ICD-10-CM | POA: Diagnosis present

## 2018-09-22 LAB — COMPREHENSIVE METABOLIC PANEL
ALT: 14 U/L (ref 0–44)
AST: 21 U/L (ref 15–41)
Albumin: 3.9 g/dL (ref 3.5–5.0)
Alkaline Phosphatase: 69 U/L (ref 38–126)
Anion gap: 8 (ref 5–15)
BUN: 18 mg/dL (ref 8–23)
CO2: 21 mmol/L — ABNORMAL LOW (ref 22–32)
Calcium: 8.5 mg/dL — ABNORMAL LOW (ref 8.9–10.3)
Chloride: 109 mmol/L (ref 98–111)
Creatinine, Ser: 1.28 mg/dL — ABNORMAL HIGH (ref 0.61–1.24)
GFR calc Af Amer: >60 mL/min (ref >60)
GFR calc non Af Amer: 55 mL/min — ABNORMAL LOW (ref >60)
Glucose, Bld: 127 mg/dL — ABNORMAL HIGH (ref 70–99)
Potassium: 4.5 mmol/L (ref 3.5–5.1)
Sodium: 138 mmol/L (ref 135–145)
Total Bilirubin: 0.9 mg/dL (ref 0.3–1.2)
Total Protein: 7.3 g/dL (ref 6.5–8.1)

## 2018-09-22 LAB — CBC WITH DIFFERENTIAL/PLATELET
Abs Immature Granulocytes: 0.01 10*3/uL (ref 0.00–0.07)
Basophils Absolute: 0 10*3/uL (ref 0.0–0.1)
Basophils Relative: 1 %
Eosinophils Absolute: 0.1 10*3/uL (ref 0.0–0.5)
Eosinophils Relative: 1 %
HCT: 44.5 % (ref 39.0–52.0)
Hemoglobin: 13.9 g/dL (ref 13.0–17.0)
Immature Granulocytes: 0 %
Lymphocytes Relative: 23 %
Lymphs Abs: 2 10*3/uL (ref 0.7–4.0)
MCH: 28.3 pg (ref 26.0–34.0)
MCHC: 31.2 g/dL (ref 30.0–36.0)
MCV: 90.6 fL (ref 80.0–100.0)
Monocytes Absolute: 0.8 10*3/uL (ref 0.1–1.0)
Monocytes Relative: 9 %
Neutro Abs: 5.8 10*3/uL (ref 1.7–7.7)
Neutrophils Relative %: 66 %
Platelets: 255 10*3/uL (ref 150–400)
RBC: 4.91 MIL/uL (ref 4.22–5.81)
RDW: 16.1 % — ABNORMAL HIGH (ref 11.5–15.5)
WBC: 8.7 10*3/uL (ref 4.0–10.5)
nRBC: 0 % (ref 0.0–0.2)

## 2018-09-22 LAB — LACTATE DEHYDROGENASE: LDH: 137 U/L (ref 98–192)

## 2018-09-22 MED ORDER — SODIUM CHLORIDE 0.9% FLUSH: 10.0000 mL | INTRAVENOUS | Status: DC | PRN

## 2018-09-22 MED ORDER — DENOSUMAB 120 MG/1.7ML ~~LOC~~ SOLN
120.0000 mg | Freq: Once | SUBCUTANEOUS | Status: AC
  Administered 2018-09-22: 120 mg via SUBCUTANEOUS

## 2018-09-22 MED ORDER — SODIUM CHLORIDE 0.9 % IV SOLN
480.0000 mg | Freq: Once | INTRAVENOUS | Status: AC
  Administered 2018-09-22: 480 mg via INTRAVENOUS
  Filled 2018-09-22: qty 48

## 2018-09-22 MED ORDER — SODIUM CHLORIDE 0.9 % IV SOLN
Freq: Once | INTRAVENOUS | Status: AC
  Administered 2018-09-22: 10:00:00 via INTRAVENOUS

## 2018-09-22 MED ORDER — HEPARIN SOD (PORK) LOCK FLUSH 100 UNIT/ML IV SOLN: 500.0000 [IU] | Freq: Once | INTRAVENOUS | Status: DC | PRN

## 2018-09-22 NOTE — Assessment & Plan Note (Signed)
1.  Metastatic melanoma to the lung, BRAF negative: - History of melanoma of the scalp vertex treated with Mohs surgery in November 5361, uncertain staging as no access to pathology. 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  Underwent modified most, diagnosed as a collision tumor (melanoma and carcinoma), likely metastatic.  Lymph nodes were clinically negative. -PET CT scan on 07/13/2017 shows hypermetabolic right lower lobe pulmonary nodule concerning for metastasis.  Additional pulmonary nodules of the right lung which are below resolution of the PET CT.  Focal highly hypermetabolic lesion of the descending colon, status post colonoscopy on 09/21/2017-tubulovillous, tubular and adenomatous polyps. - Underwent CT-guided right lower lobe lung biopsy on 10/05/2017-consistent with malignant melanoma. -Notes from Dr. Baltazar Najjar indicate BRAF V600 mutation was negative.  We will obtain reports of it. - We discussed about the normal prognosis of metastatic malignant melanoma.  We talked about treatment options including single agent ipilimumab/nivolumab  with the overall objective response rate of 40 to 45% and 4-year overall survival of 42%.  We also discussed response rates of combination immunotherapy with ipilimumab (3 mg/kg) and nivolumab (1 mg/kg) every 3 weeks for 4 doses followed by maintenance nivolumab to be around 60% with 3-year overall survival approaching 70%.  Overall survival in patients without a BRAF mutation is slightly lower. -PET CT scan dated 11/22/2017  showed at least 3 hypermetabolic right lung nodules and several additional 2 small to characterize nodules in both lungs.  Hypermetabolic metastasis in the lateral segment of the left lobe of liver noted.  At least 3 hypermetabolic peritoneal nodules identified.  Multifocal bone metastasis,  predominantly in the left humerus, thoracic vertebra, right ilium and right femur noted.  I have also reviewed the results of the brain MRI which did not show any obvious metastatic disease. - Combination immunotherapy with Yervoy and Opdivo from 12/08/2017 through 02/15/2018 (4 cycles) -PET/CT scan on 02/06/2018 showed marked improvement in the bilateral pulmonary nodules with decrease in size and no residual hypermetabolic 1.  Left liver nodule and left iliopsoas nodule have resolved.  Peritoneal nodules have also completely resolved.  Bone metastatic disease also looks better.  Anterior right subcutaneous nodule shows slight increase in size and increase in hypermetabolism. - Opdivo every 4 weeks started on 03/10/2018.  -He does not report any immunotherapy related side effects.  He has developed some rash on the left ankle few days ago.  Slightly itching.  I have told him to apply moisturizing lotion. -Physical examination today shows a vague palpable nodule in the right midclavicular line.  He refused prior biopsy. - PET CT scan on 07/24/2018 shows new focus of intense metabolic activity in the right upper lobe with SUV of 5.1.  It measures about 12 mm.  Nodule in the superior aspect of the right lower lobe measures 10 mm, decreased from 12 mm.  Right abdominal wall subcutaneous nodule measures 1.1 cm, previously 1.2 cm. -I have personally compared to prior PET CT scans.  New lung nodule in the right upper lobe was seen previously but was much smaller.  At this time we have decided to continue careful watching of this nodule.  We will continue Opdivo every 4 weeks. - MRI of brain was completed September 14, 2018 and was negative for any metastatic disease. -Labs are acceptable to proceed with treatment today.  There is no evidence of immune mediated response. -  RTC in 4 weeks.   2.  Atrial fibrillation: -We will continue Xarelto.  3.  Bone metastasis: -Denosumab monthly started on 03/10/2018. -He will  continue calcium and vitamin D supplements.

## 2018-09-22 NOTE — Progress Notes (Signed)
[place], 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, right ([place])   12/08/2017 -  Chemotherapy   The patient had ipilimumab (YERVOY) 350 mg in sodium chloride 0.9 % 150 mL chemo infusion, 3 mg/kg = 350 mg, Intravenous,  Once, 4 of 4 cycles Administration: 350 mg (12/08/2017), 350 mg (12/29/2017), 350 mg (01/20/2018), 350 mg (02/15/2018) nivolumab (OPDIVO) 116 mg in sodium chloride 0.9 % 100 mL chemo infusion, 1 mg/kg = 116 mg, Intravenous, Once, 11 of 12 cycles Dose modification: 480 mg (original dose 480 mg, Cycle 5, Reason: Other (see comments)) Administration: 116 mg (12/08/2017), 480 mg (03/10/2018), 116 mg (12/29/2017), 116 mg (01/20/2018), 100 mg (02/15/2018), 480 mg (04/07/2018), 480 mg (05/05/2018), 480 mg (06/02/2018), 480 mg (06/30/2018), 480 mg (07/28/2018), 480 mg (08/25/2018)  for chemotherapy treatment.       CANCER STAGING: Cancer Staging No matching staging information was found for the patient.   INTERVAL HISTORY:  [name] 73 y.o. male presents today for follow-up.  Reports overall doing well.  His fatigue remains at baseline.  His main concern today is his wife's declining health.  He states he is seeking long-term care facility.  She has Alzheimer's disease.  He states this puts a lot of stress and work on him.  He states overall, he is tolerating his treatments exceptionally well.  He denies any shortness of breath, or chest pain.  No nausea, vomiting, or diarrhea.  No abdominal pain.  No fevers, chills, night sweats.  No new cough.  He is here for repeat labs and treatment.   REVIEW OF SYSTEMS:  Review of  Systems  Constitutional: Positive for fatigue.  HENT:  Negative.   Eyes: Negative.   Respiratory: Negative.   Cardiovascular: Negative.   Gastrointestinal: Negative.   Endocrine: Negative.   Genitourinary: Negative.    Musculoskeletal: Negative.   Skin: Negative.   Neurological: Negative.   Hematological: Negative.   Psychiatric/Behavioral: Negative.      PAST MEDICAL/SURGICAL HISTORY:  Past Medical History:  Diagnosis Date  . Arthritis, degenerative   . Bone metastases ([place]) 12/29/2017  . Calculus of kidney   . Gout, unspecified   . Skin cancer   . Unspecified essential hypertension    Past Surgical History:  Procedure Laterality Date  . None       SOCIAL HISTORY:  Social History   Socioeconomic History  . Marital status: Married    Spouse name: Not on file  . Number of children: 1  . Years of education: Not on file  . Highest education level: Not on file  Occupational History  . Occupation: Retired    Comment: Chief Financial Officer  Social Needs  . Financial resource strain: Not hard at all  . Food insecurity    Worry: Never true    Inability: Never true  . Transportation needs    Medical: No    Non-medical: No  Tobacco Use  . Smoking status: Never Smoker  . Smokeless tobacco: Never Used  Substance and Sexual Activity  . Alcohol use: No  . Drug use: No  . Sexual activity: Not on file  Lifestyle  .  Physical activity    Days per week: 0 days    Minutes per session: 0 min  . Stress: To some extent  Relationships  . Social connections    Talks on phone: More than three times a week    Gets together: Once a week    Attends religious service: 1 to 4 times per year    Active member of club or organization: No    Attends meetings of clubs or organizations: Never    Relationship status: Married  . Intimate partner violence    Fear of current or ex partner: No    Emotionally abused: No    Physically abused: No    Forced sexual activity: No   Other Topics Concern  . Not on file  Social History Narrative  . Not on file    FAMILY HISTORY:  Family History  Problem Relation Age of Onset  . Heart disease Mother   . Colon cancer Mother   . Stroke Mother   . Alcohol abuse Father        Cause of death is Suicide  . Breast cancer Sister   . Mental illness Paternal Aunt   . Alcohol abuse Paternal Uncle     CURRENT MEDICATIONS:  Outpatient Encounter Medications as of 09/22/2018  Medication Sig  . allopurinol (ZYLOPRIM) 300 MG tablet Take 300 mg by mouth daily.  Marland Kitchen atorvastatin (LIPITOR) 10 MG tablet Take 10 mg by mouth daily.  Marland Kitchen diltiazem (CARDIZEM CD) 240 MG 24 hr capsule TAKE 1 CAPSULE (240 MG TOTAL) BY MOUTH EVERY DAY  . flurazepam (DALMANE) 30 MG capsule Take 30 mg by mouth at bedtime.  . gabapentin (NEURONTIN) 300 MG capsule Take 300 mg by mouth 3 (three) times daily.  Marland Kitchen Ipilimumab (YERVOY IV) Inject into the vein every 21 ( twenty-one) days.   Marland Kitchen KLOR-CON M20 20 MEQ tablet TAKE 1 TABLET BY MOUTH EVERY DAY  . lisinopril (PRINIVIL,ZESTRIL) 40 MG tablet Take 40 mg by mouth daily.   . magnesium oxide (MAG-OX) 400 MG tablet Take 1 tablet (400 mg total) by mouth 2 (two) times daily.  . metFORMIN (GLUCOPHAGE) 500 MG tablet Take 500 mg by mouth daily.  . metoprolol succinate (TOPROL-XL) 50 MG 24 hr tablet TAKE 1 TABLET BY MOUTH TWICE A DAY WITH FOOD  . Multiple Vitamin (MULTIVITAMIN) tablet Take 1 tablet by mouth daily.  . Nivolumab (OPDIVO IV) Inject into the vein every 21 ( twenty-one) days.   . prochlorperazine (COMPAZINE) 10 MG tablet Take 1 tablet (10 mg total) by mouth every 6 (six) hours as needed for nausea or vomiting.  . rivaroxaban (XARELTO) 20 MG TABS tablet Take 1 tablet (20 mg total) by mouth daily with supper.  . [DISCONTINUED] atorvastatin (LIPITOR) 10 MG tablet   . [DISCONTINUED] KLOR-CON M20 20 MEQ tablet   . [DISCONTINUED] metoprolol tartrate (LOPRESSOR) 50 MG tablet    No facility-administered encounter  medications on file as of 09/22/2018.     ALLERGIES:  No Known Allergies   PHYSICAL EXAM:  ECOG Performance status: 1  Vitals:   09/22/18 0931  BP: 123/63  Pulse: 67  Resp: 18  Temp: (!) 97.3 F (36.3 C)  SpO2: 97%   Filed Weights   09/22/18 0931  Weight: 247 lb 12.8 oz (112.4 kg)    Physical Exam Constitutional:      Appearance: Normal appearance. He is obese.  HENT:     Head: Normocephalic.     Nose: Nose normal.  Mouth/Throat:     Mouth: Mucous membranes are moist.     Pharynx: Oropharynx is clear.  Eyes:     Extraocular Movements: Extraocular movements intact.     Conjunctiva/sclera: Conjunctivae normal.  Neck:     Musculoskeletal: Normal range of motion.  Cardiovascular:     Rate and Rhythm: Normal rate and regular rhythm.     Pulses: Normal pulses.     Heart sounds: Normal heart sounds.  Pulmonary:     Effort: Pulmonary effort is normal.     Breath sounds: Normal breath sounds.  Abdominal:     General: Bowel sounds are normal.     Palpations: Abdomen is soft.  Musculoskeletal: Normal range of motion.  Skin:    General: Skin is warm and dry.  Neurological:     General: No focal deficit present.     Mental Status: He is alert and oriented to person, place, and time.  Psychiatric:        Mood and Affect: Mood normal.        Behavior: Behavior normal.        Thought Content: Thought content normal.        Judgment: Judgment normal.      LABORATORY DATA:  I have reviewed the labs as listed.  CBC    Component Value Date/Time   WBC 8.7 09/22/2018 0856   RBC 4.91 09/22/2018 0856   HGB 13.9 09/22/2018 0856   HCT 44.5 09/22/2018 0856   PLT 255 09/22/2018 0856   MCV 90.6 09/22/2018 0856   MCH 28.3 09/22/2018 0856   MCHC 31.2 09/22/2018 0856   RDW 16.1 (H) 09/22/2018 0856   LYMPHSABS 2.0 09/22/2018 0856   MONOABS 0.8 09/22/2018 0856   EOSABS 0.1 09/22/2018 0856   BASOSABS 0.0 09/22/2018 0856   CMP Latest Ref Rng & Units 09/22/2018 08/25/2018  07/28/2018  Glucose 70 - 99 mg/dL 127(H) 103(H) 131(H)  BUN 8 - 23 mg/dL _0 Creatinine 0.61 - 1.24 mg/dL 1.28(H) 1.31(H) 1.25(H)  Sodium 135 - 145 mmol/L 138 141 141  Potassium 3.5 - 5.1 mmol/L 4.5 4.7 4.6  Chloride 98 - 111 mmol/L 109 107 106  CO2 22 - 32 mmol/L 21(L) 23 26  Calcium 8.9 - 10.3 mg/dL 8.5(L) 9.1 9.2  Total Protein 6.5 - 8.1 g/dL 7.3 7.0 7.2  Total Bilirubin 0.3 - 1.2 mg/dL 0.9 0.6 1.1  Alkaline Phos 38 - 126 U/L 69 69 69  AST 15 - 41 U/L _1 ALT 0 - 44 U/L _2 ASSESSMENT & PLAN:   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  Underwent modified most, diagnosed as a collision tumor (melanoma and carcinoma), likely metastatic.  Lymph nodes were clinically negative. -PET CT scan on 07/13/2017 shows hypermetabolic right lower lobe pulmonary nodule concerning for metastasis.  Additional pulmonary nodules of the right lung which are below resolution of the PET CT.  Focal highly hypermetabolic lesion of the descending colon, status post colonoscopy on 09/21/2017-tubulovillous, tubular and adenomatous polyps. - Underwent CT-guided right lower lobe lung biopsy on 10/05/2017-consistent with malignant melanoma. -Notes  from Dr. Baltazar Najjar indicate BRAF V600 mutation was negative.  We will obtain reports of it. - We discussed about the normal prognosis of metastatic malignant melanoma.  We talked about treatment options including single agent ipilimumab/nivolumab  with the overall objective response rate of 40 to 45% and 4-year overall survival of 42%.  We also discussed response rates of combination  immunotherapy with ipilimumab (3 mg/kg) and nivolumab (1 mg/kg) every 3 weeks for 4 doses followed by maintenance nivolumab to be around 60% with 3-year overall survival approaching 70%.  Overall survival in patients without a BRAF mutation is slightly lower. -PET CT scan dated 11/22/2017  showed at least 3 hypermetabolic right lung nodules and several additional 2 small to characterize nodules in both lungs.  Hypermetabolic metastasis in the lateral segment of the left lobe of liver noted.  At least 3 hypermetabolic peritoneal nodules identified.  Multifocal bone metastasis, predominantly in the left humerus, thoracic vertebra, right ilium and right femur noted.  I have also reviewed the results of the brain MRI which did not show any obvious metastatic disease. - Combination immunotherapy with Yervoy and Opdivo from 12/08/2017 through 02/15/2018 (4 cycles) -PET/CT scan on 02/06/2018 showed marked improvement in the bilateral pulmonary nodules with decrease in size and no residual hypermetabolic 1.  Left liver nodule and left iliopsoas nodule have resolved.  Peritoneal nodules have also completely resolved.  Bone metastatic disease also looks better.  Anterior right subcutaneous nodule shows slight increase in size and increase in hypermetabolism. - Opdivo every 4 weeks started on 03/10/2018.  -He does not report any immunotherapy related side effects.  He has developed some rash on the left ankle few days ago.  Slightly itching.  I have told him to apply moisturizing lotion. -Physical examination today shows a vague palpable nodule in the right midclavicular line.  He refused prior biopsy. - PET CT scan on 07/24/2018 shows new focus of intense metabolic activity in the right upper lobe with SUV of 5.1.  It measures about 12 mm.  Nodule in the superior aspect of the right lower lobe measures 10 mm, decreased from 12 mm.  Right abdominal wall subcutaneous nodule measures 1.1 cm, previously 1.2 cm. -I have  personally compared to prior PET CT scans.  New lung nodule in the right upper lobe was seen previously but was much smaller.  At this time we have decided to continue careful watching of this nodule.  We will continue Opdivo every 4 weeks. - MRI of brain was completed September 14, 2018 and was negative for any metastatic disease. -Labs are acceptable to proceed with treatment today.  There is no evidence of immune mediated response. - RTC in 4 weeks.   2.  Atrial fibrillation: -We will continue Xarelto.  3.  Bone metastasis: -Denosumab monthly started on 03/10/2018. -He will continue calcium and vitamin D supplements.        Orders placed this encounter:  Orders Placed This Encounter  Procedures  . CBC with Differential  . Comprehensive metabolic panel  . Lactate dehydrogenase      Roger Shelter, [place] [phone]

## 2018-09-22 NOTE — Patient Instructions (Signed)
[facility] Discharge Instructions for Patients Receiving Chemotherapy  Today you received the following chemotherapy agents   To help prevent nausea and vomiting after your treatment, we encourage you to take your nausea medication   If you develop nausea and vomiting that is not controlled by your nausea medication, call the clinic.   BELOW ARE SYMPTOMS THAT SHOULD BE REPORTED IMMEDIATELY:  *FEVER GREATER THAN 100.5 F  *CHILLS WITH OR WITHOUT FEVER  NAUSEA AND VOMITING THAT IS NOT CONTROLLED WITH YOUR NAUSEA MEDICATION  *UNUSUAL SHORTNESS OF BREATH  *UNUSUAL BRUISING OR BLEEDING  TENDERNESS IN MOUTH AND THROAT WITH OR WITHOUT PRESENCE OF ULCERS  *URINARY PROBLEMS  *BOWEL PROBLEMS  UNUSUAL RASH Items with * indicate a potential emergency and should be followed up as soon as possible.  Feel free to call the clinic should you have any questions or concerns. The clinic phone number is [phone].  Please show the CHEMO ALERT CARD at check-in to the Emergency Department and triage nurse.   

## 2018-09-22 NOTE — Progress Notes (Signed)
Labs reviewed at office visit today , proceed with treatment today per Reynolds Bowl, NP  Treatment given per orders. Patient tolerated it well without problems. Vitals stable and discharged home from clinic ambulatory. Follow up as scheduled.

## 2018-09-27 ENCOUNTER — Other Ambulatory Visit (HOSPITAL_COMMUNITY): Payer: Self-pay | Admitting: Nurse Practitioner

## 2018-09-27 DIAGNOSIS — C7801 Secondary malignant neoplasm of right lung: Secondary | ICD-10-CM

## 2018-10-18 ENCOUNTER — Other Ambulatory Visit (HOSPITAL_COMMUNITY): Payer: Self-pay | Admitting: Hematology

## 2018-10-18 DIAGNOSIS — C7801 Secondary malignant neoplasm of right lung: Secondary | ICD-10-CM

## 2018-10-20 ENCOUNTER — Inpatient Hospital Stay (HOSPITAL_COMMUNITY): Payer: Medicare Other | Attending: Hematology

## 2018-10-20 ENCOUNTER — Ambulatory Visit (HOSPITAL_COMMUNITY): Payer: Medicare Other

## 2018-10-20 ENCOUNTER — Other Ambulatory Visit (HOSPITAL_COMMUNITY): Payer: Medicare Other

## 2018-10-20 ENCOUNTER — Other Ambulatory Visit: Payer: Self-pay

## 2018-10-20 ENCOUNTER — Ambulatory Visit (HOSPITAL_COMMUNITY): Payer: Medicare Other | Admitting: Hematology

## 2018-10-20 ENCOUNTER — Inpatient Hospital Stay (HOSPITAL_COMMUNITY): Payer: Medicare Other

## 2018-10-20 ENCOUNTER — Inpatient Hospital Stay (HOSPITAL_BASED_OUTPATIENT_CLINIC_OR_DEPARTMENT_OTHER): Payer: Medicare Other | Admitting: Hematology

## 2018-10-20 VITALS — BP 115/66 | HR 63 | Temp 97.6°F | Resp 18

## 2018-10-20 DIAGNOSIS — M549 Dorsalgia, unspecified: Secondary | ICD-10-CM | POA: Diagnosis not present

## 2018-10-20 DIAGNOSIS — Z811 Family history of alcohol abuse and dependence: Secondary | ICD-10-CM | POA: Diagnosis not present

## 2018-10-20 DIAGNOSIS — Z803 Family history of malignant neoplasm of breast: Secondary | ICD-10-CM | POA: Insufficient documentation

## 2018-10-20 DIAGNOSIS — Z79899 Other long term (current) drug therapy: Secondary | ICD-10-CM | POA: Diagnosis not present

## 2018-10-20 DIAGNOSIS — Z5112 Encounter for antineoplastic immunotherapy: Secondary | ICD-10-CM | POA: Diagnosis present

## 2018-10-20 DIAGNOSIS — C7951 Secondary malignant neoplasm of bone: Secondary | ICD-10-CM | POA: Insufficient documentation

## 2018-10-20 DIAGNOSIS — Z823 Family history of stroke: Secondary | ICD-10-CM | POA: Diagnosis not present

## 2018-10-20 DIAGNOSIS — M2550 Pain in unspecified joint: Secondary | ICD-10-CM | POA: Diagnosis not present

## 2018-10-20 DIAGNOSIS — I4891 Unspecified atrial fibrillation: Secondary | ICD-10-CM | POA: Insufficient documentation

## 2018-10-20 DIAGNOSIS — C7801 Secondary malignant neoplasm of right lung: Secondary | ICD-10-CM | POA: Insufficient documentation

## 2018-10-20 DIAGNOSIS — E669 Obesity, unspecified: Secondary | ICD-10-CM | POA: Insufficient documentation

## 2018-10-20 DIAGNOSIS — Z818 Family history of other mental and behavioral disorders: Secondary | ICD-10-CM | POA: Diagnosis not present

## 2018-10-20 DIAGNOSIS — Z7901 Long term (current) use of anticoagulants: Secondary | ICD-10-CM | POA: Diagnosis not present

## 2018-10-20 DIAGNOSIS — C439 Malignant melanoma of skin, unspecified: Secondary | ICD-10-CM | POA: Diagnosis present

## 2018-10-20 DIAGNOSIS — Z87442 Personal history of urinary calculi: Secondary | ICD-10-CM | POA: Diagnosis not present

## 2018-10-20 DIAGNOSIS — Z8249 Family history of ischemic heart disease and other diseases of the circulatory system: Secondary | ICD-10-CM | POA: Insufficient documentation

## 2018-10-20 LAB — CBC WITH DIFFERENTIAL/PLATELET
Abs Immature Granulocytes: 0.03 10*3/uL (ref 0.00–0.07)
Basophils Absolute: 0.1 10*3/uL (ref 0.0–0.1)
Basophils Relative: 1 %
Eosinophils Absolute: 0.1 10*3/uL (ref 0.0–0.5)
Eosinophils Relative: 1 %
HCT: 42.9 % (ref 39.0–52.0)
Hemoglobin: 13.5 g/dL (ref 13.0–17.0)
Immature Granulocytes: 0 %
Lymphocytes Relative: 26 %
Lymphs Abs: 2.5 10*3/uL (ref 0.7–4.0)
MCH: 28.4 pg (ref 26.0–34.0)
MCHC: 31.5 g/dL (ref 30.0–36.0)
MCV: 90.1 fL (ref 80.0–100.0)
Monocytes Absolute: 0.8 10*3/uL (ref 0.1–1.0)
Monocytes Relative: 8 %
Neutro Abs: 6.1 10*3/uL (ref 1.7–7.7)
Neutrophils Relative %: 64 %
Platelets: 239 10*3/uL (ref 150–400)
RBC: 4.76 MIL/uL (ref 4.22–5.81)
RDW: 16.3 % — ABNORMAL HIGH (ref 11.5–15.5)
WBC: 9.5 10*3/uL (ref 4.0–10.5)
nRBC: 0 % (ref 0.0–0.2)

## 2018-10-20 LAB — COMPREHENSIVE METABOLIC PANEL
ALT: 16 U/L (ref 0–44)
AST: 22 U/L (ref 15–41)
Albumin: 3.9 g/dL (ref 3.5–5.0)
Alkaline Phosphatase: 72 U/L (ref 38–126)
Anion gap: 10 (ref 5–15)
BUN: 17 mg/dL (ref 8–23)
CO2: 22 mmol/L (ref 22–32)
Calcium: 9.2 mg/dL (ref 8.9–10.3)
Chloride: 104 mmol/L (ref 98–111)
Creatinine, Ser: 1.18 mg/dL (ref 0.61–1.24)
GFR calc Af Amer: >60 mL/min (ref >60)
GFR calc non Af Amer: >60 mL/min (ref >60)
Glucose, Bld: 142 mg/dL — ABNORMAL HIGH (ref 70–99)
Potassium: 4.3 mmol/L (ref 3.5–5.1)
Sodium: 136 mmol/L (ref 135–145)
Total Bilirubin: 1 mg/dL (ref 0.3–1.2)
Total Protein: 7.4 g/dL (ref 6.5–8.1)

## 2018-10-20 LAB — TSH: TSH: 1.294 u[IU]/mL (ref 0.350–4.500)

## 2018-10-20 MED ORDER — SODIUM CHLORIDE 0.9 % IV SOLN
Freq: Once | INTRAVENOUS | Status: AC
  Administered 2018-10-20: 10:00:00 via INTRAVENOUS

## 2018-10-20 MED ORDER — DENOSUMAB 120 MG/1.7ML ~~LOC~~ SOLN
120.0000 mg | Freq: Once | SUBCUTANEOUS | Status: AC
  Administered 2018-10-20: 120 mg via SUBCUTANEOUS

## 2018-10-20 MED ORDER — SODIUM CHLORIDE 0.9 % IV SOLN
480.0000 mg | Freq: Once | INTRAVENOUS | Status: AC
  Administered 2018-10-20: 480 mg via INTRAVENOUS
  Filled 2018-10-20: qty 48

## 2018-10-20 NOTE — Progress Notes (Signed)
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, right ([place])   12/08/2017 -  Chemotherapy   The patient had ipilimumab (YERVOY) 350 mg in sodium chloride 0.9 % 150 mL chemo infusion, 3 mg/kg = 350 mg, Intravenous,  Once, 4 of 4 cycles Administration: 350 mg (12/08/2017), 350 mg (12/29/2017), 350 mg (01/20/2018), 350 mg (02/15/2018) nivolumab (OPDIVO) 116 mg in sodium chloride 0.9 % 100 mL chemo infusion, 1 mg/kg = 116 mg, Intravenous, Once, 13 of 16 cycles Dose modification: 480 mg (original dose 480 mg, Cycle 5, Reason: Other (see comments)) Administration: 116 mg (12/08/2017), 480 mg (03/10/2018), 116 mg (12/29/2017), 116 mg (01/20/2018), 100 mg (02/15/2018), 480 mg (04/07/2018), 480 mg (05/05/2018), 480 mg (06/02/2018), 480 mg (06/30/2018), 480 mg (07/28/2018), 480 mg (08/25/2018), 480 mg (09/22/2018)  for chemotherapy treatment.        INTERVAL HISTORY:  Mr. Dorey 73 y.o. male presents today for follow-up.  He reports overall doing well.  He denies any significant fatigue.  He is currently on Opdivo, tolerating well.  His main concern today is social stress.  His wife has dementia and he is the sole caregiver.  He states he cannot put her in a nursing home at this time due to [place].  He denies any fevers, chills, night sweats.  Denies any recent hospitalizations.  Appetite is stable.  He states he is ready for treatment today.   REVIEW OF SYSTEMS:  Review of Systems  Constitutional: Negative.   HENT:  Negative.   Eyes: Negative.   Respiratory: Negative.   Cardiovascular: Negative.   Gastrointestinal: Negative.    Endocrine: Negative.   Genitourinary: Negative.    Musculoskeletal: Positive for arthralgias and back pain.  Skin: Negative.   Neurological: Negative.   Hematological: Negative.   Psychiatric/Behavioral: Negative.      PAST MEDICAL/SURGICAL HISTORY:  Past Medical History:  Diagnosis Date  . Arthritis, degenerative   . Bone metastases ([place]) 12/29/2017  . Calculus of kidney   . Gout, unspecified   . Skin cancer   . Unspecified essential hypertension    Past Surgical History:  Procedure Laterality Date  . None       SOCIAL HISTORY:  Social History   Socioeconomic History  . Marital status: Married    Spouse name: Not on file  . Number of children: 1  . Years of education: Not on file  . Highest education level: Not on file  Occupational History  . Occupation: Retired    Comment: Chief Financial Officer  Social Needs  . Financial resource strain: Not hard at all  . Food insecurity    Worry: Never true    Inability: Never true  . Transportation needs    Medical: No    Non-medical: No  Tobacco Use  . Smoking status: Never Smoker  . Smokeless tobacco: Never Used  Substance and Sexual Activity  . Alcohol use: No  . Drug use: No  . Sexual activity: Not on file  Lifestyle  . Physical activity    Days per week: 0 days    Minutes per session: 0 min  . Stress: To some extent  Relationships  . Social connections    Talks on phone: More than three times a week    Gets together: Once a week    Attends religious service: 1 to 4 times per year    Active member of club or organization: No    Attends meetings of clubs or organizations: Never    Relationship status: Married  . Intimate partner violence    Fear of current or ex partner: No    Emotionally abused: No    Physically abused: No    Forced sexual activity: No  Other Topics Concern  . Not on file  Social History Narrative  . Not on file    FAMILY HISTORY:  Family History  Problem Relation Age  of Onset  . Heart disease Mother   . Colon cancer Mother   . Stroke Mother   . Alcohol abuse Father        Cause of death is Suicide  . Breast cancer Sister   . Mental illness Paternal Aunt   . Alcohol abuse Paternal Uncle     CURRENT MEDICATIONS:  Outpatient Encounter Medications as of 10/20/2018  Medication Sig  . allopurinol (ZYLOPRIM) 300 MG tablet Take 300 mg by mouth daily.  Marland Kitchen atorvastatin (LIPITOR) 10 MG tablet Take 10 mg by mouth daily.  Marland Kitchen diltiazem (CARDIZEM CD) 240 MG 24 hr capsule TAKE 1 CAPSULE (240 MG TOTAL) BY MOUTH EVERY DAY  . flurazepam (DALMANE) 30 MG capsule Take 30 mg by mouth at bedtime.  . gabapentin (NEURONTIN) 300 MG capsule Take 300 mg by mouth 3 (three) times daily.  Marland Kitchen Ipilimumab (YERVOY IV) Inject into the vein every 21 ( twenty-one) days.   Marland Kitchen KLOR-CON M20 20 MEQ tablet TAKE 1 TABLET BY MOUTH EVERY DAY  . lisinopril (PRINIVIL,ZESTRIL) 40 MG tablet Take 40 mg by mouth daily.   . magnesium oxide (MAG-OX) 400 MG tablet TAKE 1 TABLET BY MOUTH TWICE A DAY  . metFORMIN (GLUCOPHAGE) 500 MG tablet Take 500 mg by mouth daily.  . metoprolol succinate (TOPROL-XL) 50 MG 24 hr tablet TAKE 1 TABLET BY MOUTH TWICE A DAY WITH FOOD  . Multiple Vitamin (MULTIVITAMIN) tablet Take 1 tablet by mouth daily.  . Nivolumab (OPDIVO IV) Inject into the vein every 21 ( twenty-one) days.   . prochlorperazine (COMPAZINE) 10 MG tablet Take 1 tablet (10 mg total) by mouth every 6 (six) hours as needed for nausea or vomiting.  . rivaroxaban (XARELTO) 20 MG TABS tablet Take 1 tablet (20 mg total) by mouth daily with supper.  . [DISCONTINUED] KLOR-CON M20 20 MEQ tablet TAKE 1 TABLET BY MOUTH EVERY DAY   No facility-administered encounter medications on file as of 10/20/2018.     ALLERGIES:  No Known Allergies   PHYSICAL EXAM:  ECOG Performance status: 2  There were no vitals filed for this visit. There were no vitals filed for this visit.  Physical Exam Constitutional:       Appearance: Normal appearance. He is obese.  HENT:     Head: Normocephalic.     Right Ear: External ear normal.     Left Ear: External ear normal.     Nose: Nose normal.     Mouth/Throat:     Mouth: Mucous membranes are moist.     Pharynx: Oropharynx is clear.  Eyes:     Extraocular Movements: Extraocular movements intact.     Conjunctiva/sclera: Conjunctivae normal.  Neck:     Musculoskeletal: Normal range of motion.  Cardiovascular:     Rate and Rhythm: Normal rate and regular rhythm.     Pulses: Normal pulses.     Heart sounds: Normal heart sounds.  Pulmonary:     Effort: Pulmonary effort is normal.     Breath sounds: Normal breath sounds.  Abdominal:     General: Bowel sounds are normal.  Musculoskeletal: Normal range of motion.  Skin:    General: Skin is warm and dry.  Neurological:     General: No focal deficit present.     Mental Status: He is alert and oriented to person, place, and time.  Psychiatric:        Mood and Affect: Mood normal.        Behavior: Behavior normal.        Thought Content: Thought content normal.        Judgment: Judgment normal.      LABORATORY DATA:  I have reviewed the labs as listed.  CBC    Component Value Date/Time   WBC 9.5 10/20/2018 0830   RBC 4.76 10/20/2018 0830   HGB 13.5 10/20/2018 0830   HCT 42.9 10/20/2018 0830   PLT 239 10/20/2018 0830   MCV 90.1 10/20/2018 0830   MCH 28.4 10/20/2018 0830   MCHC 31.5 10/20/2018 0830   RDW 16.3 (H) 10/20/2018 0830   LYMPHSABS 2.5 10/20/2018 0830   MONOABS 0.8 10/20/2018 0830   EOSABS 0.1 10/20/2018 0830   BASOSABS 0.1 10/20/2018 0830   CMP Latest Ref Rng & Units 10/20/2018 09/22/2018 08/25/2018  Glucose 70 - 99 mg/dL 142(H) 127(H) 103(H)  BUN 8 - 23 mg/dL 17 18 19   Creatinine 0.61 - 1.24 mg/dL 1.18 1.28(H) 1.31(H)  Sodium 135 - 145 mmol/L 136 138 141  Potassium 3.5 - 5.1 mmol/L 4.3 4.5 4.7  Chloride 98 - 111 mmol/L 104 109 107  CO2 22 - 32 mmol/L 22 21(L) 23  Calcium 8.9 - 10.3  mg/dL 9.2 8.5(L) 9.1  Total Protein 6.5 - 8.1 g/dL 7.4 7.3 7.0  Total Bilirubin 0.3 - 1.2 mg/dL 1.0 0.9 0.6  Alkaline Phos 38 - 126 U/L 72 69 69  AST 15 - 41 U/L 22 21 21   ALT 0 - 44 U/L 16 14 15           ASSESSMENT & PLAN:   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  Metastatic melanoma to the lung, BRAF negative: - History of melanoma of the scalp vertex treated with Mohs surgery in November 0998, uncertain staging as no access to pathology. 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  Underwent modified most, diagnosed as a collision tumor (melanoma and carcinoma), likely metastatic.  Lymph nodes were clinically negative. -PET CT scan on 07/13/2017 shows hypermetabolic right lower lobe pulmonary nodule concerning for metastasis.  Additional pulmonary nodules of the right lung which are below resolution of the PET CT.  Focal highly hypermetabolic lesion of the descending colon, status post colonoscopy on 09/21/2017-tubulovillous, tubular and adenomatous polyps. - Underwent CT-guided right lower lobe lung biopsy on 10/05/2017-consistent with malignant melanoma. -Notes from Dr. Baltazar Najjar indicate BRAF V600 mutation was negative.  We will obtain reports of it. - We discussed about the normal prognosis of metastatic malignant melanoma.  We talked about treatment options including single agent ipilimumab/nivolumab  with the overall objective response rate of 40 to 45% and 4-year overall survival of 42%.  We also discussed response rates of combination immunotherapy with ipilimumab (3 mg/kg) and nivolumab (1 mg/kg) every 3 weeks for 4 doses followed by maintenance nivolumab to be around 60% with 3-year overall survival approaching 70%.  Overall survival in patients without a BRAF mutation is slightly lower. -PET CT scan dated 11/22/2017  showed at least 3  hypermetabolic right lung nodules and several additional 2 small to characterize nodules in both lungs.  Hypermetabolic metastasis in the lateral segment of the left lobe of liver noted.  At least 3 hypermetabolic peritoneal nodules identified.  Multifocal bone metastasis, predominantly in the left humerus, thoracic vertebra, right ilium and right femur noted.  I have also reviewed the results of the brain MRI which did not show any obvious metastatic disease. - Combination immunotherapy with Yervoy and Opdivo from 12/08/2017 through 02/15/2018 (4 cycles) -PET/CT scan on 02/06/2018 showed marked improvement in the bilateral pulmonary nodules with decrease in size and no residual hypermetabolic 1.  Left liver nodule and left iliopsoas nodule have resolved.  Peritoneal nodules have also completely resolved.  Bone metastatic disease also looks better.  Anterior right subcutaneous nodule shows slight increase in size and increase in hypermetabolism. - Opdivo every 4 weeks started on 03/10/2018.  -He does not report any immunotherapy related side effects.  He has developed some rash on the left ankle few days ago.  Slightly itching.  I have told him to apply moisturizing lotion. -Physical examination today shows a vague palpable nodule in the right midclavicular line.  He refused prior biopsy. - PET CT scan on 07/24/2018 shows new focus of intense metabolic activity in the right upper lobe with SUV of 5.1.  It measures about 12 mm.  Nodule in the superior aspect of the right lower lobe measures 10 mm, decreased from 12 mm.  Right abdominal wall subcutaneous nodule measures 1.1 cm, previously 1.2 cm. -I have personally compared to prior PET CT scans.  New lung nodule in the right upper lobe was seen previously but was much smaller.  At this time we have decided to continue careful watching of this nodule.  We will continue Opdivo every 4 weeks. - MRI of brain was completed September 14, 2018 and was negative for any  metastatic disease. - Acceptable today to proceed with Opdivo infusion.  We will plan to repeat a PET CT prior to his next office visit in 4 weeks.  2.  Atrial fibrillation: -We will continue Xarelto.  3.  Bone metastasis: -Denosumab monthly started on 03/10/2018. -He will continue calcium and vitamin D supplements.        Orders placed this encounter:  Orders Placed This Encounter  Procedures  . NM PET Image Restag (PS) Skull Base To Thigh  . CBC with Differential  . Platelet by Citrate      Roger Shelter, [place] [phone]

## 2018-10-20 NOTE — Patient Instructions (Signed)
[facility] Discharge Instructions for Patients Receiving Chemotherapy  Today you received the following chemotherapy agents   To help prevent nausea and vomiting after your treatment, we encourage you to take your nausea medication   If you develop nausea and vomiting that is not controlled by your nausea medication, call the clinic.   BELOW ARE SYMPTOMS THAT SHOULD BE REPORTED IMMEDIATELY:  *FEVER GREATER THAN 100.5 F  *CHILLS WITH OR WITHOUT FEVER  NAUSEA AND VOMITING THAT IS NOT CONTROLLED WITH YOUR NAUSEA MEDICATION  *UNUSUAL SHORTNESS OF BREATH  *UNUSUAL BRUISING OR BLEEDING  TENDERNESS IN MOUTH AND THROAT WITH OR WITHOUT PRESENCE OF ULCERS  *URINARY PROBLEMS  *BOWEL PROBLEMS  UNUSUAL RASH Items with * indicate a potential emergency and should be followed up as soon as possible.  Feel free to call the clinic should you have any questions or concerns. The clinic phone number is [phone].  Please show the CHEMO ALERT CARD at check-in to the Emergency Department and triage nurse.   

## 2018-10-20 NOTE — Progress Notes (Signed)
Pt presents today for treatment and appointment with RNester NP. VS within parameters for tx. MAR reviewed. Pt has no complaints of any significant changes since the last visit. TSH added to today's lab work. Labs pending.   Treatment given today per MD orders. Tolerated infusion without adverse affects. Vital signs stable. No complaints at this time. Discharged from clinic ambulatory. F/U with [facility] as scheduled.

## 2018-10-20 NOTE — Assessment & Plan Note (Signed)
1.  Metastatic melanoma to the lung, BRAF negative: - History of melanoma of the scalp vertex treated with Mohs surgery in November 2017, uncertain staging as no access to pathology. 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  Underwent modified most, diagnosed as a collision tumor (melanoma and carcinoma), likely metastatic.  Lymph nodes were clinically negative. -PET CT scan on 07/13/2017 shows hypermetabolic right lower lobe pulmonary nodule concerning for metastasis.  Additional pulmonary nodules of the right lung which are below resolution of the PET CT.  Focal highly hypermetabolic lesion of the descending colon, status post colonoscopy on 09/21/2017-tubulovillous, tubular and adenomatous polyps. - Underwent CT-guided right lower lobe lung biopsy on 10/05/2017-consistent with malignant melanoma. -Notes from Dr. Triozzi indicate BRAF V600 mutation was negative.  We will obtain reports of it. - We discussed about the normal prognosis of metastatic malignant melanoma.  We talked about treatment options including single agent ipilimumab/nivolumab  with the overall objective response rate of 40 to 45% and 4-year overall survival of 42%.  We also discussed response rates of combination immunotherapy with ipilimumab (3 mg/kg) and nivolumab (1 mg/kg) every 3 weeks for 4 doses followed by maintenance nivolumab to be around 60% with 3-year overall survival approaching 70%.  Overall survival in patients without a BRAF mutation is slightly lower. -PET CT scan dated 11/22/2017  showed at least 3 hypermetabolic right lung nodules and several additional 2 small to characterize nodules in both lungs.  Hypermetabolic metastasis in the lateral segment of the left lobe of liver noted.  At least 3 hypermetabolic peritoneal nodules identified.  Multifocal bone metastasis,  predominantly in the left humerus, thoracic vertebra, right ilium and right femur noted.  I have also reviewed the results of the brain MRI which did not show any obvious metastatic disease. - Combination immunotherapy with Yervoy and Opdivo from 12/08/2017 through 02/15/2018 (4 cycles) -PET/CT scan on 02/06/2018 showed marked improvement in the bilateral pulmonary nodules with decrease in size and no residual hypermetabolic 1.  Left liver nodule and left iliopsoas nodule have resolved.  Peritoneal nodules have also completely resolved.  Bone metastatic disease also looks better.  Anterior right subcutaneous nodule shows slight increase in size and increase in hypermetabolism. - Opdivo every 4 weeks started on 03/10/2018.  -He does not report any immunotherapy related side effects.  He has developed some rash on the left ankle few days ago.  Slightly itching.  I have told him to apply moisturizing lotion. -Physical examination today shows a vague palpable nodule in the right midclavicular line.  He refused prior biopsy. - PET CT scan on 07/24/2018 shows new focus of intense metabolic activity in the right upper lobe with SUV of 5.1.  It measures about 12 mm.  Nodule in the superior aspect of the right lower lobe measures 10 mm, decreased from 12 mm.  Right abdominal wall subcutaneous nodule measures 1.1 cm, previously 1.2 cm. -I have personally compared to prior PET CT scans.  New lung nodule in the right upper lobe was seen previously but was much smaller.  At this time we have decided to continue careful watching of this nodule.  We will continue Opdivo every 4 weeks. - MRI of brain was completed September 14, 2018 and was negative for any metastatic disease. - Acceptable today to proceed with Opdivo infusion.  We will plan to repeat a PET CT   prior to his next office visit in 4 weeks.  2.  Atrial fibrillation: -We will continue Xarelto.  3.  Bone metastasis: -Denosumab monthly started on 03/10/2018. -He  will continue calcium and vitamin D supplements.

## 2018-10-25 ENCOUNTER — Other Ambulatory Visit (HOSPITAL_COMMUNITY): Payer: Self-pay | Admitting: Nurse Practitioner

## 2018-10-25 DIAGNOSIS — C7801 Secondary malignant neoplasm of right lung: Secondary | ICD-10-CM

## 2018-11-13 ENCOUNTER — Ambulatory Visit (HOSPITAL_COMMUNITY)
Admission: RE | Admit: 2018-11-13 | Discharge: 2018-11-13 | Disposition: A | Discharge status: Home / Self Care | Payer: Medicare Other | Source: Ambulatory Visit | Attending: Hematology | Admitting: Hematology

## 2018-11-13 ENCOUNTER — Other Ambulatory Visit: Payer: Self-pay

## 2018-11-13 DIAGNOSIS — C7801 Secondary malignant neoplasm of right lung: Secondary | ICD-10-CM | POA: Insufficient documentation

## 2018-11-13 MED ORDER — FLUDEOXYGLUCOSE F - 18 (FDG) INJECTION
13.9300 | Freq: Once | INTRAVENOUS | Status: AC | PRN
  Administered 2018-11-13: 15:00:00 13.93 via INTRAVENOUS

## 2018-11-15 ENCOUNTER — Encounter (HOSPITAL_COMMUNITY): Payer: Self-pay | Admitting: Hematology

## 2018-11-15 ENCOUNTER — Other Ambulatory Visit: Payer: Self-pay

## 2018-11-15 ENCOUNTER — Inpatient Hospital Stay (HOSPITAL_COMMUNITY): Payer: Medicare Other | Attending: Hematology | Admitting: Hematology

## 2018-11-15 DIAGNOSIS — Z8249 Family history of ischemic heart disease and other diseases of the circulatory system: Secondary | ICD-10-CM | POA: Diagnosis not present

## 2018-11-15 DIAGNOSIS — C7951 Secondary malignant neoplasm of bone: Secondary | ICD-10-CM | POA: Diagnosis not present

## 2018-11-15 DIAGNOSIS — Z811 Family history of alcohol abuse and dependence: Secondary | ICD-10-CM | POA: Diagnosis not present

## 2018-11-15 DIAGNOSIS — Z823 Family history of stroke: Secondary | ICD-10-CM | POA: Insufficient documentation

## 2018-11-15 DIAGNOSIS — Z818 Family history of other mental and behavioral disorders: Secondary | ICD-10-CM | POA: Insufficient documentation

## 2018-11-15 DIAGNOSIS — M1990 Unspecified osteoarthritis, unspecified site: Secondary | ICD-10-CM | POA: Diagnosis not present

## 2018-11-15 DIAGNOSIS — I4891 Unspecified atrial fibrillation: Secondary | ICD-10-CM | POA: Diagnosis not present

## 2018-11-15 DIAGNOSIS — Z800 Family history of malignant neoplasm of digestive organs: Secondary | ICD-10-CM | POA: Diagnosis not present

## 2018-11-15 DIAGNOSIS — C7801 Secondary malignant neoplasm of right lung: Secondary | ICD-10-CM

## 2018-11-15 DIAGNOSIS — Z5112 Encounter for antineoplastic immunotherapy: Secondary | ICD-10-CM | POA: Insufficient documentation

## 2018-11-15 DIAGNOSIS — Z803 Family history of malignant neoplasm of breast: Secondary | ICD-10-CM | POA: Insufficient documentation

## 2018-11-15 DIAGNOSIS — Z79899 Other long term (current) drug therapy: Secondary | ICD-10-CM | POA: Diagnosis not present

## 2018-11-15 DIAGNOSIS — C439 Malignant melanoma of skin, unspecified: Secondary | ICD-10-CM | POA: Diagnosis present

## 2018-11-15 MED ORDER — DILTIAZEM HCL ER COATED BEADS 240 MG PO CP24: ORAL_CAPSULE | ORAL | 0 refills | Status: DC

## 2018-11-15 NOTE — Patient Instructions (Addendum)
[place] at [facility] Discharge Instructions  You were seen today by Dr. Delton Coombes. He went over your recent scan results. He will see you back in 4 weeks for labs and follow up.   Thank you for choosing [place] at [facility] to provide your oncology and hematology care.  To afford each patient quality time with our provider, please arrive at least 15 minutes before your scheduled appointment time.   If you have a lab appointment with the [facility] please come in thru the  Main Entrance and check in at the main information desk  You need to re-schedule your appointment should you arrive 10 or more minutes late.  We strive to give you quality time with our providers, and arriving late affects you and other patients whose appointments are after yours.  Also, if you no show three or more times for appointments you may be dismissed from the clinic at the providers discretion.     Again, thank you for choosing [facility].  Our hope is that these requests will decrease the amount of time that you wait before being seen by our physicians.       _____________________________________________________________  Should you have questions after your visit to [facility], please contact our office at [phone] between the hours of 8:00 a.m. and 4:30 p.m.  Voicemails left after 4:00 p.m. will not be returned until the following business day.  For prescription refill requests, have your pharmacy contact our office and allow 72 hours.    Cancer Center Support Programs:   > Cancer Support Group  2nd Tuesday of the month 1pm-2pm, Journey Room

## 2018-11-15 NOTE — Progress Notes (Signed)
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, right ([place])  11/10/2017 Initial Diagnosis   Metastatic melanoma to lung, right ([place])   12/08/2017 -  Chemotherapy   The patient had ipilimumab (YERVOY) 350 mg in sodium chloride 0.9 % 150 mL chemo infusion, 3 mg/kg = 350 mg, Intravenous,  Once, 4 of 4 cycles Administration: 350 mg (12/08/2017), 350 mg (12/29/2017), 350 mg (01/20/2018), 350 mg (02/15/2018) nivolumab (OPDIVO) 116 mg in sodium chloride 0.9 % 100 mL chemo infusion, 1 mg/kg = 116 mg, Intravenous, Once, 14 of 16 cycles Dose modification: 480 mg (original dose 480 mg, Cycle 5, Reason: Other (see comments)) Administration: 116 mg (12/08/2017), 480 mg (03/10/2018), 116 mg (12/29/2017), 116 mg (01/20/2018), 100 mg (02/15/2018), 480 mg (04/07/2018), 480 mg (05/05/2018), 480 mg (06/02/2018), 480 mg (06/30/2018), 480 mg (07/28/2018), 480 mg (08/25/2018), 480 mg (09/22/2018), 480 mg (10/20/2018), 480 mg (11/17/2018)  for chemotherapy treatment.       CANCER STAGING: Cancer Staging No matching staging information was found for the patient.   INTERVAL HISTORY:  Mr. Tallman 73 y.o. male seen for follow-up and immunotherapy for metastatic melanoma.  Appetite is 75%.  Energy levels are low.  Pain in the knees were reported.  Mild fatigue is stable.  Shortness of breath on exertion is also stable.  Had 1 or 2 loose stools on and off.  He is having problems taking care of his wife who has advanced dementia.  Apparently his son from [place] will be taking her with him.  Denies any fevers or chills.  Denies any recent hospitalizations.  No skin rashes reported.  No dry  cough.  No headaches or vision changes.     REVIEW OF SYSTEMS:  Review of Systems  Constitutional: Positive for fatigue.  Respiratory: Positive for shortness of breath.   Gastrointestinal: Positive for diarrhea.  Skin: Positive for rash.  Hematological: Bruises/bleeds easily.  Psychiatric/Behavioral: Positive for sleep disturbance.     PAST MEDICAL/SURGICAL HISTORY:  Past Medical History:  Diagnosis Date  . Arthritis, degenerative   . Bone metastases ([place]) 12/29/2017  . Calculus of kidney   . Gout, unspecified   . Skin cancer   . Unspecified essential hypertension    Past Surgical History:  Procedure Laterality Date  . None       SOCIAL HISTORY:  Social History   Socioeconomic History  . Marital status: Married    Spouse name: Not on file  . Number of children: 1  . Years of education: Not on file  . Highest education level: Not on file  Occupational History  . Occupation: Retired    Comment: Chief Financial Officer  Social Needs  . Financial resource strain: Not hard at all  . Food insecurity    Worry: Never true    Inability: Never true  . Transportation needs    Medical: No    Non-medical: No  Tobacco Use  . Smoking status: Never Smoker  . Smokeless tobacco: Never Used  Substance and Sexual Activity  . Alcohol use: No  . Drug use: No  . Sexual activity: Not on file  Lifestyle  . Physical activity    Days  per week: 0 days    Minutes per session: 0 min  . Stress: To some extent  Relationships  . Social connections    Talks on phone: More than three times a week    Gets together: Once a week    Attends religious service: 1 to 4 times per year    Active member of club or organization: No    Attends meetings of clubs or organizations: Never    Relationship status: Married  . Intimate partner violence    Fear of current or ex partner: No    Emotionally abused: No    Physically abused: No    Forced sexual activity: No  Other Topics  Concern  . Not on file  Social History Narrative  . Not on file    FAMILY HISTORY:  Family History  Problem Relation Age of Onset  . Heart disease Mother   . Colon cancer Mother   . Stroke Mother   . Alcohol abuse Father        Cause of death is Suicide  . Breast cancer Sister   . Mental illness Paternal Aunt   . Alcohol abuse Paternal Uncle     CURRENT MEDICATIONS:  Outpatient Encounter Medications as of 11/15/2018  Medication Sig  . allopurinol (ZYLOPRIM) 300 MG tablet Take 300 mg by mouth daily.  Marland Kitchen atorvastatin (LIPITOR) 10 MG tablet Take 10 mg by mouth daily.  . flurazepam (DALMANE) 30 MG capsule Take 30 mg by mouth at bedtime.  . gabapentin (NEURONTIN) 300 MG capsule Take 300 mg by mouth 3 (three) times daily.  Marland Kitchen Ipilimumab (YERVOY IV) Inject into the vein every 21 ( twenty-one) days.   Marland Kitchen KLOR-CON M20 20 MEQ tablet TAKE 1 TABLET BY MOUTH EVERY DAY  . lisinopril (PRINIVIL,ZESTRIL) 40 MG tablet Take 40 mg by mouth daily.   . magnesium oxide (MAG-OX) 400 MG tablet TAKE 1 TABLET BY MOUTH TWICE A DAY  . metFORMIN (GLUCOPHAGE) 500 MG tablet Take 500 mg by mouth daily.  . metoprolol succinate (TOPROL-XL) 50 MG 24 hr tablet TAKE 1 TABLET BY MOUTH TWICE A DAY WITH FOOD  . Multiple Vitamin (MULTIVITAMIN) tablet Take 1 tablet by mouth daily.  . Nivolumab (OPDIVO IV) Inject into the vein every 21 ( twenty-one) days.   . rivaroxaban (XARELTO) 20 MG TABS tablet Take 1 tablet (20 mg total) by mouth daily with supper.  . [DISCONTINUED] diltiazem (CARDIZEM CD) 240 MG 24 hr capsule TAKE 1 CAPSULE (240 MG TOTAL) BY MOUTH EVERY DAY  . [DISCONTINUED] KLOR-CON M20 20 MEQ tablet TAKE 1 TABLET BY MOUTH EVERY DAY  . [DISCONTINUED] prochlorperazine (COMPAZINE) 10 MG tablet Take 1 tablet (10 mg total) by mouth every 6 (six) hours as needed for nausea or vomiting. (Patient not taking: Reported on 11/15/2018)   No facility-administered encounter medications on file as of 11/15/2018.     ALLERGIES:  No  Known Allergies   PHYSICAL EXAM:  ECOG Performance status: 1  Vitals:   11/15/18 1135  BP: 124/64  Pulse: 70  Resp: 16  Temp: (!) 97.1 F (36.2 C)  SpO2: 96%   Filed Weights   11/15/18 1135  Weight: 246 lb 4.8 oz (111.7 kg)    Physical Exam Vitals signs reviewed.  Constitutional:      Appearance: Normal appearance.  Cardiovascular:     Rate and Rhythm: Normal rate and regular rhythm.     Heart sounds: Normal heart sounds.  Pulmonary:     Effort: Pulmonary  effort is normal.     Breath sounds: Normal breath sounds.  Abdominal:     General: There is no distension.     Palpations: Abdomen is soft. There is no mass.  Musculoskeletal:        General: No swelling.  Lymphadenopathy:     Cervical: No cervical adenopathy.  Skin:    General: Skin is warm.  Neurological:     General: No focal deficit present.     Mental Status: He is alert and oriented to person, place, and time.  Psychiatric:        Mood and Affect: Mood normal.        Behavior: Behavior normal.      LABORATORY DATA:  I have reviewed the labs as listed.  CBC    Component Value Date/Time   WBC 8.8 11/17/2018 0834   RBC 4.66 11/17/2018 0834   HGB 13.8 11/17/2018 0834   HCT 43.6 11/17/2018 0834   PLT 239 11/17/2018 0834   MCV 93.6 11/17/2018 0834   MCH 29.6 11/17/2018 0834   MCHC 31.7 11/17/2018 0834   RDW 16.1 (H) 11/17/2018 0834   LYMPHSABS 2.3 11/17/2018 0834   MONOABS 0.8 11/17/2018 0834   EOSABS 0.1 11/17/2018 0834   BASOSABS 0.0 11/17/2018 0834   CMP Latest Ref Rng & Units 11/17/2018 10/20/2018 09/22/2018  Glucose 70 - 99 mg/dL 126(H) 142(H) 127(H)  BUN 8 - 23 mg/dL _0 Creatinine 0.61 - 1.24 mg/dL 1.33(H) 1.18 1.28(H)  Sodium 135 - 145 mmol/L 137 136 138  Potassium 3.5 - 5.1 mmol/L 4.6 4.3 4.5  Chloride 98 - 111 mmol/L 106 104 109  CO2 22 - 32 mmol/L 22 22 21(L)  Calcium 8.9 - 10.3 mg/dL 8.9 9.2 8.5(L)  Total Protein 6.5 - 8.1 g/dL 7.4 7.4 7.3  Total Bilirubin 0.3 - 1.2 mg/dL  0.7 1.0 0.9  Alkaline Phos 38 - 126 U/L 72 72 69  AST 15 - 41 U/L _1 ALT 0 - 44 U/L _2 DIAGNOSTIC IMAGING:  I have independently reviewed the scans and discussed with the patient.   I have reviewed Venita Lick LPN's note and agree with the documentation.  I personally performed a face-to-face visit, made revisions and my assessment and plan is as follows.    ASSESSMENT & PLAN:   Metastatic melanoma to lung, right ([place]) 1.  Metastatic melanoma to the lung, BRAF negative: - History of melanoma of the scalp vertex treated with Mohs surgery in November 4037, uncertain staging as no access to pathology. 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  Underwent modified most, diagnosed as a collision tumor (melanoma and carcinoma), likely metastatic.  Lymph nodes were clinically negative. -PET CT scan on 07/13/2017 shows hypermetabolic right lower lobe pulmonary nodule concerning for metastasis.  Additional pulmonary nodules of the right lung which are below resolution of the PET CT.  Focal highly hypermetabolic lesion of the descending colon, status post colonoscopy on 09/21/2017-tubulovillous, tubular and adenomatous polyps. - Underwent CT-guided right lower lobe lung biopsy on 10/05/2017-consistent with malignant melanoma. -Notes from Dr. Baltazar Najjar indicate BRAF V600 mutation was negative.  We will obtain reports of it. -PET CT scan dated 11/22/2017  showed at least 3 hypermetabolic right lung nodules and several additional 2 small to characterize nodules in both lungs.  Hypermetabolic metastasis in the lateral segment of the left lobe of liver noted.  At least 3 hypermetabolic peritoneal nodules identified.  Multifocal bone metastasis, predominantly in the left humerus, thoracic vertebra, right ilium and right femur noted.  I have also  reviewed the results of the brain MRI which did not show any obvious metastatic disease. - Combination immunotherapy with Yervoy and Opdivo from 12/08/2017 through 02/15/2018 (4 cycles) -PET/CT scan on 02/06/2018 showed marked improvement in the bilateral pulmonary nodules with decrease in size and no residual hypermetabolic 1.  Left liver nodule and left iliopsoas nodule have resolved.  Peritoneal nodules have also completely resolved.  Bone metastatic disease also looks better.  Anterior right subcutaneous nodule shows slight increase in size and increase in hypermetabolism. - Opdivo every 4 weeks started on 03/10/2018.  - PET CT scan on 07/24/2018 shows new focus of intense metabolic activity in the right upper lobe with SUV of 5.1.  It measures about 12 mm.  Nodule in the superior aspect of the right lower lobe measures 10 mm, decreased from 12 mm.  Right abdominal wall subcutaneous nodule measures 1.1 cm, previously 1.2 cm. - MRI of the brain on 09/14/2018 did not show any metastatic disease. - We reviewed PET scan from 11/13/2018.  Right upper lobe lung nodule measuring 1.5 cm has SUV of 9.5.  New mass in the right adrenal gland measuring 1.9 cm with SUV of 9.2 favoring metastatic lesion.  Nodule in the right anterior abdominal wall below the rib cage measures 1.9 x 1.5 cm, increased in size by 3 mm compared to prior scan.  SUV 7.0.  Sclerotic bone metastases are stable with no hypermetabolic activity. -We had a prolonged discussion about the findings on the PET CT scan.  Right adrenal nodule is the only new metastatic disease. -I have recommended CyberKnife therapy to this area.  I will refer him to [facility] care as it is close to him. -I have called and talked to Dr. Theadore Nan who kindly agreed to see this patient. -he will come back Friday for his immunotherapy infusion.  I will see him back in 4 weeks for follow-up.  2.  Atrial fibrillation: -He will continue Xarelto.  3.  Bone metastasis:  -He was started on denosumab on 03/10/2018.  He is tolerating it well. -We will continue calcium and vitamin D supplements.    Total time spent is 40 minutes with more than 50% of the time spent face-to-face discussing scan results, treatment plan, counseling and coordination of care.    Orders placed this encounter:  No orders of the defined types were placed in this encounter.     Derek Jack, MD [place] [phone]

## 2018-11-17 ENCOUNTER — Other Ambulatory Visit: Payer: Self-pay

## 2018-11-17 ENCOUNTER — Ambulatory Visit (HOSPITAL_COMMUNITY): Payer: Medicare Other | Admitting: Hematology

## 2018-11-17 ENCOUNTER — Inpatient Hospital Stay (HOSPITAL_COMMUNITY): Payer: Medicare Other

## 2018-11-17 ENCOUNTER — Encounter (HOSPITAL_COMMUNITY): Payer: Self-pay

## 2018-11-17 VITALS — BP 94/61 | HR 84 | Temp 97.9°F | Resp 16 | Wt 244.6 lb

## 2018-11-17 DIAGNOSIS — C439 Malignant melanoma of skin, unspecified: Secondary | ICD-10-CM

## 2018-11-17 DIAGNOSIS — C7951 Secondary malignant neoplasm of bone: Secondary | ICD-10-CM

## 2018-11-17 DIAGNOSIS — C7801 Secondary malignant neoplasm of right lung: Secondary | ICD-10-CM

## 2018-11-17 DIAGNOSIS — Z5112 Encounter for antineoplastic immunotherapy: Secondary | ICD-10-CM | POA: Diagnosis not present

## 2018-11-17 LAB — CBC WITH DIFFERENTIAL/PLATELET
Abs Immature Granulocytes: 0.03 10*3/uL (ref 0.00–0.07)
Basophils Absolute: 0 10*3/uL (ref 0.0–0.1)
Basophils Relative: 0 %
Eosinophils Absolute: 0.1 10*3/uL (ref 0.0–0.5)
Eosinophils Relative: 2 %
HCT: 43.6 % (ref 39.0–52.0)
Hemoglobin: 13.8 g/dL (ref 13.0–17.0)
Immature Granulocytes: 0 %
Lymphocytes Relative: 27 %
Lymphs Abs: 2.3 10*3/uL (ref 0.7–4.0)
MCH: 29.6 pg (ref 26.0–34.0)
MCHC: 31.7 g/dL (ref 30.0–36.0)
MCV: 93.6 fL (ref 80.0–100.0)
Monocytes Absolute: 0.8 10*3/uL (ref 0.1–1.0)
Monocytes Relative: 9 %
Neutro Abs: 5.5 10*3/uL (ref 1.7–7.7)
Neutrophils Relative %: 62 %
Platelets: 239 10*3/uL (ref 150–400)
RBC: 4.66 MIL/uL (ref 4.22–5.81)
RDW: 16.1 % — ABNORMAL HIGH (ref 11.5–15.5)
WBC: 8.8 10*3/uL (ref 4.0–10.5)
nRBC: 0 % (ref 0.0–0.2)

## 2018-11-17 LAB — COMPREHENSIVE METABOLIC PANEL
ALT: 19 U/L (ref 0–44)
AST: 25 U/L (ref 15–41)
Albumin: 3.8 g/dL (ref 3.5–5.0)
Alkaline Phosphatase: 72 U/L (ref 38–126)
Anion gap: 9 (ref 5–15)
BUN: 21 mg/dL (ref 8–23)
CO2: 22 mmol/L (ref 22–32)
Calcium: 8.9 mg/dL (ref 8.9–10.3)
Chloride: 106 mmol/L (ref 98–111)
Creatinine, Ser: 1.33 mg/dL — ABNORMAL HIGH (ref 0.61–1.24)
GFR calc Af Amer: >60 mL/min (ref >60)
GFR calc non Af Amer: 52 mL/min — ABNORMAL LOW (ref >60)
Glucose, Bld: 126 mg/dL — ABNORMAL HIGH (ref 70–99)
Potassium: 4.6 mmol/L (ref 3.5–5.1)
Sodium: 137 mmol/L (ref 135–145)
Total Bilirubin: 0.7 mg/dL (ref 0.3–1.2)
Total Protein: 7.4 g/dL (ref 6.5–8.1)

## 2018-11-17 LAB — TSH: TSH: 1.642 u[IU]/mL (ref 0.350–4.500)

## 2018-11-17 MED ORDER — SODIUM CHLORIDE 0.9 % IV SOLN
480.0000 mg | Freq: Once | INTRAVENOUS | Status: AC
  Administered 2018-11-17: 480 mg via INTRAVENOUS
  Filled 2018-11-17: qty 8

## 2018-11-17 MED ORDER — DENOSUMAB 120 MG/1.7ML ~~LOC~~ SOLN
120.0000 mg | Freq: Once | SUBCUTANEOUS | Status: AC
  Administered 2018-11-17: 10:00:00 120 mg via SUBCUTANEOUS
  Filled 2018-11-17: qty 1.7

## 2018-11-17 MED ORDER — SODIUM CHLORIDE 0.9 % IV SOLN
Freq: Once | INTRAVENOUS | Status: AC
  Administered 2018-11-17: 09:00:00 via INTRAVENOUS

## 2018-11-17 NOTE — Progress Notes (Signed)
Christopher Baird presents today for [place]. Lab work reviewed prior to administration. Pt tolerated treatment without incident or complaint. VSS. Peripheral IV noted for positive blood return prior to, during, and post infusion by 2 RNs. Pt discharged in satisfactory condition with follow up instructions.

## 2018-11-17 NOTE — Patient Instructions (Signed)
[facility] Discharge Instructions for Patients Receiving Chemotherapy   Beginning January 23rd 2017 lab work for the [facility] will be done in the  Main lab at [facility] on 1st floor. If you have a lab appointment with the [facility] please come in thru the  Main Entrance and check in at the main information desk   Today you received the following chemotherapy agents Opdivo and Xgeva  To help prevent nausea and vomiting after your treatment, we encourage you to take your nausea medication    If you develop nausea and vomiting, or diarrhea that is not controlled by your medication, call the clinic.  The clinic phone number is [phone]. Office hours are Monday-Friday 8:30am-5:00pm.  BELOW ARE SYMPTOMS THAT SHOULD BE REPORTED IMMEDIATELY:  *FEVER GREATER THAN 101.0 F  *CHILLS WITH OR WITHOUT FEVER  NAUSEA AND VOMITING THAT IS NOT CONTROLLED WITH YOUR NAUSEA MEDICATION  *UNUSUAL SHORTNESS OF BREATH  *UNUSUAL BRUISING OR BLEEDING  TENDERNESS IN MOUTH AND THROAT WITH OR WITHOUT PRESENCE OF ULCERS  *URINARY PROBLEMS  *BOWEL PROBLEMS  UNUSUAL RASH Items with * indicate a potential emergency and should be followed up as soon as possible. If you have an emergency after office hours please contact your primary care physician or go to the nearest emergency department.  Please call the clinic during office hours if you have any questions or concerns.   You may also contact the Patient Navigator at [phone] should you have any questions or need assistance in obtaining follow up care.      Resources For Cancer Patients and their Caregivers ? American Cancer Society: Can assist with transportation, wigs, general needs, runs Look Good Feel Better.        641 883 2029 ? Cancer Care: Provides financial assistance, online support groups, medication/co-pay assistance.  1-800-813-HOPE 616-029-2895) ? [facility] Assists [place] cancer patients and their families through emotional , educational and financial support.  709-754-2866 ? [place] DSS Where to apply for food stamps, Medicaid and utility assistance. 503-516-0961 ? [facility]: Transportation to medical appointments. 985-791-2019 ? Social Security Administration: May apply for disability if have a Stage IV cancer. ([phone] ? [employer], Disability and Transit Services: Assists with nutrition, care and transit needs. [phone]

## 2018-11-18 ENCOUNTER — Other Ambulatory Visit (HOSPITAL_COMMUNITY): Payer: Self-pay | Admitting: Nurse Practitioner

## 2018-11-18 ENCOUNTER — Encounter (HOSPITAL_COMMUNITY): Payer: Self-pay | Admitting: Hematology

## 2018-11-18 DIAGNOSIS — C7801 Secondary malignant neoplasm of right lung: Secondary | ICD-10-CM

## 2018-11-18 NOTE — Assessment & Plan Note (Signed)
1.  Metastatic melanoma to the lung, BRAF negative: - History of melanoma of the scalp vertex treated with Mohs surgery in November 3662, uncertain staging as no access to pathology. 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  Underwent modified most, diagnosed as a collision tumor (melanoma and carcinoma), likely metastatic.  Lymph nodes were clinically negative. -PET CT scan on 07/13/2017 shows hypermetabolic right lower lobe pulmonary nodule concerning for metastasis.  Additional pulmonary nodules of the right lung which are below resolution of the PET CT.  Focal highly hypermetabolic lesion of the descending colon, status post colonoscopy on 09/21/2017-tubulovillous, tubular and adenomatous polyps. - Underwent CT-guided right lower lobe lung biopsy on 10/05/2017-consistent with malignant melanoma. -Notes from Dr. Baltazar Najjar indicate BRAF V600 mutation was negative.  We will obtain reports of it. -PET CT scan dated 11/22/2017  showed at least 3 hypermetabolic right lung nodules and several additional 2 small to characterize nodules in both lungs.  Hypermetabolic metastasis in the lateral segment of the left lobe of liver noted.  At least 3 hypermetabolic peritoneal nodules identified.  Multifocal bone metastasis, predominantly in the left humerus, thoracic vertebra, right ilium and right femur noted.  I have also reviewed the results of the brain MRI which did not show any obvious metastatic disease. - Combination immunotherapy with Yervoy and Opdivo from 12/08/2017 through 02/15/2018 (4 cycles) -PET/CT scan on 02/06/2018 showed marked improvement in the bilateral pulmonary nodules with decrease in size and no residual hypermetabolic 1.  Left liver nodule and left iliopsoas nodule have resolved.  Peritoneal nodules have also completely resolved.  Bone metastatic  disease also looks better.  Anterior right subcutaneous nodule shows slight increase in size and increase in hypermetabolism. - Opdivo every 4 weeks started on 03/10/2018.  - PET CT scan on 07/24/2018 shows new focus of intense metabolic activity in the right upper lobe with SUV of 5.1.  It measures about 12 mm.  Nodule in the superior aspect of the right lower lobe measures 10 mm, decreased from 12 mm.  Right abdominal wall subcutaneous nodule measures 1.1 cm, previously 1.2 cm. - MRI of the brain on 09/14/2018 did not show any metastatic disease. - We reviewed PET scan from 11/13/2018.  Right upper lobe lung nodule measuring 1.5 cm has SUV of 9.5.  New mass in the right adrenal gland measuring 1.9 cm with SUV of 9.2 favoring metastatic lesion.  Nodule in the right anterior abdominal wall below the rib cage measures 1.9 x 1.5 cm, increased in size by 3 mm compared to prior scan.  SUV 7.0.  Sclerotic bone metastases are stable with no hypermetabolic activity. -We had a prolonged discussion about the findings on the PET CT scan.  Right adrenal nodule is the only new metastatic disease. -I have recommended CyberKnife therapy to this area.  I will refer him to [facility] cancer care as it is close to him. -I have called and talked to Dr. Theadore Nan who kindly agreed to see this patient. -he will come back Friday for his immunotherapy infusion.  I will see him back in 4 weeks for follow-up.  2.  Atrial fibrillation: -He will continue Xarelto.  3.  Bone metastasis: -He was started on denosumab on 03/10/2018.  He is tolerating it well. -We will continue calcium and vitamin D supplements.

## 2018-11-21 ENCOUNTER — Encounter (HOSPITAL_COMMUNITY): Payer: Self-pay | Admitting: General Practice

## 2018-11-21 ENCOUNTER — Inpatient Hospital Stay (HOSPITAL_COMMUNITY): Payer: Medicare Other | Admitting: General Practice

## 2018-11-21 NOTE — Progress Notes (Signed)
[facility] CSW Progress Notes  Call to patient at request of nurse navigator.  Major stressor is caring for wife at home - "she doesn't get around as well as she once did, have to be concerned if we go out, need to be concerned about her falling."  "She is still able to get up, dress, use bathroom by herself, so that's where we are right now."  In last 1.5 years, "things have seemed like they have speeded up a bit, so far we are still making it/managing."    "When I'm dealing with cancer and she's dealing with dementia, it's a little bit of a load."  PT feels like he doesn't have much strength/energy, but "still able to get out" to appointments and out to eat.  Has told wife that "if something happens to me, something will need to happen to her, she will be living somewhere else."  Feels wife cannot process this information, does not seem to accept it.  Have one son - lives 5.5 hours away.  "He's been very good all his life, would hopefully be looking after mom."  Talks w son several times/week.  Visits in person for weekend monthly.  COVID has complicated situation - "clouded my picture of what might happen - the places she might be able to go locally, some of them now are question mark."  [place]    Patient has HCPOA, will and "most of the things we need in place."  Has not taken care of funeral expenses, "I guess Donnald Garre just put it off..."    Has brother and sister in law that "I could call on and depend on - feel pretty sure I could count on my brother, if someone needs to come stay w my wife, my sister in law is willing to do that."    Will be starting radiation for the first time in [place].  Unsure how that will impact his energy level and life. "I don't know what to expect to be honest."    Has considered various assisted living and similar situations - no good option at this point given COVID concerns.  Unsure about hiring in home caregiver helper.  Not  ready for that at this point.    Has a realistic attitude towards his own treatment and his situation as a caregiver.  Used to find pleasure is simple things such as going out to eat locally, has had difficulty doing this due to inconsistent compliance by restaurants w masking/social distancing etc.  Can go for short drives, visit brother who lives nearby, checks in with family by telephone.  Overall understands that while his situation may be challenging "it could be worse", does not want to make any changes as it is not absolutely necessary at this time.  Is coping as best he can with difficult situation.  Edwyna Shell, LCSW Clinical Social Worker Phone:  [phone] Cell:  [phone]

## 2018-11-23 ENCOUNTER — Encounter (HOSPITAL_COMMUNITY): Payer: Self-pay | Admitting: *Deleted

## 2018-11-23 NOTE — Progress Notes (Signed)
I spoke with patient via telephone today. I had to verify where he received his lung biopsy. He wasn't sure but thinks that it was [facility].  Care everywhere did reveal results from CT guided lung biopsy. Those results were faxed to Dr. Theadore Nan with [facility] in [place].

## 2018-12-15 ENCOUNTER — Other Ambulatory Visit (HOSPITAL_COMMUNITY): Payer: Self-pay | Admitting: *Deleted

## 2018-12-15 DIAGNOSIS — D500 Iron deficiency anemia secondary to blood loss (chronic): Secondary | ICD-10-CM

## 2018-12-15 DIAGNOSIS — C7801 Secondary malignant neoplasm of right lung: Secondary | ICD-10-CM

## 2018-12-15 DIAGNOSIS — D509 Iron deficiency anemia, unspecified: Secondary | ICD-10-CM

## 2018-12-15 DIAGNOSIS — D696 Thrombocytopenia, unspecified: Secondary | ICD-10-CM

## 2018-12-16 ENCOUNTER — Other Ambulatory Visit (HOSPITAL_COMMUNITY): Payer: Self-pay | Admitting: Nurse Practitioner

## 2018-12-16 DIAGNOSIS — C7801 Secondary malignant neoplasm of right lung: Secondary | ICD-10-CM

## 2018-12-18 ENCOUNTER — Encounter (HOSPITAL_COMMUNITY): Payer: Self-pay | Admitting: Hematology

## 2018-12-18 ENCOUNTER — Other Ambulatory Visit: Payer: Self-pay

## 2018-12-18 ENCOUNTER — Inpatient Hospital Stay (HOSPITAL_COMMUNITY): Payer: Medicare Other

## 2018-12-18 ENCOUNTER — Inpatient Hospital Stay (HOSPITAL_COMMUNITY): Payer: Medicare Other | Attending: Hematology | Admitting: Hematology

## 2018-12-18 VITALS — BP 122/71 | HR 60 | Temp 96.9°F | Resp 18

## 2018-12-18 DIAGNOSIS — C439 Malignant melanoma of skin, unspecified: Secondary | ICD-10-CM | POA: Diagnosis present

## 2018-12-18 DIAGNOSIS — Z811 Family history of alcohol abuse and dependence: Secondary | ICD-10-CM | POA: Insufficient documentation

## 2018-12-18 DIAGNOSIS — Z803 Family history of malignant neoplasm of breast: Secondary | ICD-10-CM | POA: Insufficient documentation

## 2018-12-18 DIAGNOSIS — R5383 Other fatigue: Secondary | ICD-10-CM | POA: Diagnosis not present

## 2018-12-18 DIAGNOSIS — R21 Rash and other nonspecific skin eruption: Secondary | ICD-10-CM | POA: Diagnosis not present

## 2018-12-18 DIAGNOSIS — Z800 Family history of malignant neoplasm of digestive organs: Secondary | ICD-10-CM | POA: Insufficient documentation

## 2018-12-18 DIAGNOSIS — Z823 Family history of stroke: Secondary | ICD-10-CM | POA: Diagnosis not present

## 2018-12-18 DIAGNOSIS — M25561 Pain in right knee: Secondary | ICD-10-CM | POA: Diagnosis not present

## 2018-12-18 DIAGNOSIS — C7801 Secondary malignant neoplasm of right lung: Secondary | ICD-10-CM

## 2018-12-18 DIAGNOSIS — Z8249 Family history of ischemic heart disease and other diseases of the circulatory system: Secondary | ICD-10-CM | POA: Insufficient documentation

## 2018-12-18 DIAGNOSIS — R0602 Shortness of breath: Secondary | ICD-10-CM | POA: Insufficient documentation

## 2018-12-18 DIAGNOSIS — M25562 Pain in left knee: Secondary | ICD-10-CM | POA: Diagnosis not present

## 2018-12-18 DIAGNOSIS — D500 Iron deficiency anemia secondary to blood loss (chronic): Secondary | ICD-10-CM

## 2018-12-18 DIAGNOSIS — C7951 Secondary malignant neoplasm of bone: Secondary | ICD-10-CM

## 2018-12-18 DIAGNOSIS — Z818 Family history of other mental and behavioral disorders: Secondary | ICD-10-CM | POA: Insufficient documentation

## 2018-12-18 DIAGNOSIS — Z79899 Other long term (current) drug therapy: Secondary | ICD-10-CM | POA: Insufficient documentation

## 2018-12-18 DIAGNOSIS — D509 Iron deficiency anemia, unspecified: Secondary | ICD-10-CM

## 2018-12-18 DIAGNOSIS — G479 Sleep disorder, unspecified: Secondary | ICD-10-CM | POA: Insufficient documentation

## 2018-12-18 DIAGNOSIS — Z5112 Encounter for antineoplastic immunotherapy: Secondary | ICD-10-CM | POA: Diagnosis present

## 2018-12-18 DIAGNOSIS — R197 Diarrhea, unspecified: Secondary | ICD-10-CM | POA: Insufficient documentation

## 2018-12-18 DIAGNOSIS — D696 Thrombocytopenia, unspecified: Secondary | ICD-10-CM

## 2018-12-18 LAB — CBC WITH DIFFERENTIAL/PLATELET
Abs Immature Granulocytes: 0.04 10*3/uL (ref 0.00–0.07)
Basophils Absolute: 0 10*3/uL (ref 0.0–0.1)
Basophils Relative: 0 %
Eosinophils Absolute: 0.2 10*3/uL (ref 0.0–0.5)
Eosinophils Relative: 2 %
HCT: 44.8 % (ref 39.0–52.0)
Hemoglobin: 14 g/dL (ref 13.0–17.0)
Immature Granulocytes: 0 %
Lymphocytes Relative: 28 %
Lymphs Abs: 2.9 10*3/uL (ref 0.7–4.0)
MCH: 30.3 pg (ref 26.0–34.0)
MCHC: 31.3 g/dL (ref 30.0–36.0)
MCV: 97 fL (ref 80.0–100.0)
Monocytes Absolute: 0.9 10*3/uL (ref 0.1–1.0)
Monocytes Relative: 9 %
Neutro Abs: 6.4 10*3/uL (ref 1.7–7.7)
Neutrophils Relative %: 61 %
Platelets: 237 10*3/uL (ref 150–400)
RBC: 4.62 MIL/uL (ref 4.22–5.81)
RDW: 16 % — ABNORMAL HIGH (ref 11.5–15.5)
WBC: 10.5 10*3/uL (ref 4.0–10.5)
nRBC: 0 % (ref 0.0–0.2)

## 2018-12-18 LAB — COMPREHENSIVE METABOLIC PANEL
ALT: 19 U/L (ref 0–44)
AST: 27 U/L (ref 15–41)
Albumin: 3.9 g/dL (ref 3.5–5.0)
Alkaline Phosphatase: 67 U/L (ref 38–126)
Anion gap: 9 (ref 5–15)
BUN: 15 mg/dL (ref 8–23)
CO2: 26 mmol/L (ref 22–32)
Calcium: 9.5 mg/dL (ref 8.9–10.3)
Chloride: 105 mmol/L (ref 98–111)
Creatinine, Ser: 1.29 mg/dL — ABNORMAL HIGH (ref 0.61–1.24)
GFR calc Af Amer: >60 mL/min (ref >60)
GFR calc non Af Amer: 54 mL/min — ABNORMAL LOW (ref >60)
Glucose, Bld: 113 mg/dL — ABNORMAL HIGH (ref 70–99)
Potassium: 4.7 mmol/L (ref 3.5–5.1)
Sodium: 140 mmol/L (ref 135–145)
Total Bilirubin: 1.2 mg/dL (ref 0.3–1.2)
Total Protein: 7.4 g/dL (ref 6.5–8.1)

## 2018-12-18 MED ORDER — SODIUM CHLORIDE 0.9 % IV SOLN
Freq: Once | INTRAVENOUS | Status: AC
  Administered 2018-12-18: 11:00:00 via INTRAVENOUS

## 2018-12-18 MED ORDER — DENOSUMAB 120 MG/1.7ML ~~LOC~~ SOLN
120.0000 mg | Freq: Once | SUBCUTANEOUS | Status: AC
  Administered 2018-12-18: 120 mg via SUBCUTANEOUS
  Filled 2018-12-18: qty 1.7

## 2018-12-18 MED ORDER — SODIUM CHLORIDE 0.9% FLUSH: 10.0000 mL | INTRAVENOUS | Status: DC | PRN

## 2018-12-18 MED ORDER — SODIUM CHLORIDE 0.9 % IV SOLN
480.0000 mg | Freq: Once | INTRAVENOUS | Status: AC
  Administered 2018-12-18: 480 mg via INTRAVENOUS
  Filled 2018-12-18: qty 48

## 2018-12-18 MED ORDER — HEPARIN SOD (PORK) LOCK FLUSH 100 UNIT/ML IV SOLN: 500.0000 [IU] | Freq: Once | INTRAVENOUS | Status: DC | PRN

## 2018-12-18 NOTE — Patient Instructions (Addendum)
[facility] Discharge Instructions  You were seen today by Dr. Delton Coombes. He went over your recent lab results. Continue Xgeva injections. He will see you back in 3 weeks for labs and follow up.   Thank you for choosing [place] at [facility] to provide your oncology and hematology care.  To afford each patient quality time with our provider, please arrive at least 15 minutes before your scheduled appointment time.   If you have a lab appointment with the [facility] please come in thru the  Main Entrance and check in at the main information desk  You need to re-schedule your appointment should you arrive 10 or more minutes late.  We strive to give you quality time with our providers, and arriving late affects you and other patients whose appointments are after yours.  Also, if you no show three or more times for appointments you may be dismissed from the clinic at the providers discretion.     Again, thank you for choosing [facility].  Our hope is that these requests will decrease the amount of time that you wait before being seen by our physicians.       _____________________________________________________________  Should you have questions after your visit to [facility], please contact our office at [phone] between the hours of 8:00 a.m. and 4:30 p.m.  Voicemails left after 4:00 p.m. will not be returned until the following business day.  For prescription refill requests, have your pharmacy contact our office and allow 72 hours.    Cancer Center Support Programs:   > Cancer Support Group  2nd Tuesday of the month 1pm-2pm, Journey Room

## 2018-12-18 NOTE — Progress Notes (Signed)
Labs reviewed with MD today. Proceed with treatment per MD

## 2018-12-18 NOTE — Progress Notes (Signed)
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, right ([place])  11/10/2017 Initial Diagnosis   Metastatic melanoma to lung, right ([place])   12/08/2017 -  Chemotherapy   The patient had ipilimumab (YERVOY) 350 mg in sodium chloride 0.9 % 150 mL chemo infusion, 3 mg/kg = 350 mg, Intravenous,  Once, 4 of 4 cycles Administration: 350 mg (12/08/2017), 350 mg (12/29/2017), 350 mg (01/20/2018), 350 mg (02/15/2018) nivolumab (OPDIVO) 116 mg in sodium chloride 0.9 % 100 mL chemo infusion, 1 mg/kg = 116 mg, Intravenous, Once, 15 of 18 cycles Dose modification: 480 mg (original dose 480 mg, Cycle 5, Reason: Other (see comments)) Administration: 116 mg (12/08/2017), 480 mg (03/10/2018), 116 mg (12/29/2017), 116 mg (01/20/2018), 100 mg (02/15/2018), 480 mg (04/07/2018), 480 mg (05/05/2018), 480 mg (06/02/2018), 480 mg (06/30/2018), 480 mg (07/28/2018), 480 mg (08/25/2018), 480 mg (09/22/2018), 480 mg (10/20/2018), 480 mg (11/17/2018), 480 mg (12/18/2018)  for chemotherapy treatment.       CANCER STAGING: Cancer Staging No matching staging information was found for the patient.   INTERVAL HISTORY:  Mr. Yero 73 y.o. male seen for toxicity assessment of immunotherapy.  He is currently receiving Opdivo every 4 weeks.  Denies any changes in his baseline diarrhea.  He has 2-3 soft bowel movements per day.  Denies any nausea or vomiting.  Appetite is 50%.  Energy levels are low.  He is having some stress about taking care of his wife who has dementia.  He is having sleep problems at times.  He also reported some dyspnea on exertion.  No skin rashes was reported.  No fevers or night sweats.      REVIEW OF SYSTEMS:  Review of Systems  Constitutional: Positive for fatigue.  Respiratory: Positive for shortness of breath.   Gastrointestinal: Positive for diarrhea.  Skin: Positive for rash.  Psychiatric/Behavioral: Positive for sleep disturbance.     PAST MEDICAL/SURGICAL HISTORY:  Past Medical History:  Diagnosis Date  . Arthritis, degenerative   . Bone metastases ([place]) 12/29/2017  . Calculus of kidney   . Gout, unspecified   . Skin cancer   . Unspecified essential hypertension    Past Surgical History:  Procedure Laterality Date  . None       SOCIAL HISTORY:  Social History   Socioeconomic History  . Marital status: Married    Spouse name: Not on file  . Number of children: 1  . Years of education: Not on file  . Highest education level: Not on file  Occupational History  . Occupation: Retired    Comment: Chief Financial Officer  Social Needs  . Financial resource strain: Not hard at all  . Food insecurity    Worry: Never true    Inability: Never true  . Transportation needs    Medical: No    Non-medical: No  Tobacco Use  . Smoking status: Never Smoker  . Smokeless tobacco: Never Used  Substance and Sexual Activity  . Alcohol use: No  . Drug use: No  . Sexual activity: Not on file  Lifestyle  . Physical activity    Days per week: 0 days    Minutes per session: 0 min  .  Stress: To some extent  Relationships  . Social connections    Talks on phone: More than three times a week    Gets together: Once a week    Attends religious service: 1 to 4 times per year    Active member of club or organization: No    Attends meetings of clubs or organizations: Never    Relationship status: Married  . Intimate partner violence    Fear of current or ex partner: No    Emotionally abused: No    Physically abused: No    Forced sexual activity: No  Other Topics Concern  . Not on file  Social History Narrative  . Not on file    FAMILY HISTORY:   Family History  Problem Relation Age of Onset  . Heart disease Mother   . Colon cancer Mother   . Stroke Mother   . Alcohol abuse Father        Cause of death is Suicide  . Breast cancer Sister   . Mental illness Paternal Aunt   . Alcohol abuse Paternal Uncle     CURRENT MEDICATIONS:  Outpatient Encounter Medications as of 12/18/2018  Medication Sig  . allopurinol (ZYLOPRIM) 300 MG tablet Take 300 mg by mouth daily.  Marland Kitchen atorvastatin (LIPITOR) 10 MG tablet Take 10 mg by mouth daily.  Marland Kitchen diltiazem (CARDIZEM CD) 240 MG 24 hr capsule TAKE 1 CAPSULE (240 MG TOTAL) BY MOUTH EVERY DAY  . flurazepam (DALMANE) 30 MG capsule Take 30 mg by mouth at bedtime.  . gabapentin (NEURONTIN) 300 MG capsule Take 300 mg by mouth 3 (three) times daily.  Marland Kitchen Ipilimumab (YERVOY IV) Inject into the vein every 21 ( twenty-one) days.   Marland Kitchen lisinopril (PRINIVIL,ZESTRIL) 40 MG tablet Take 40 mg by mouth daily.   . magnesium oxide (MAG-OX) 400 MG tablet TAKE 1 TABLET BY MOUTH TWICE A DAY  . metFORMIN (GLUCOPHAGE) 500 MG tablet Take 500 mg by mouth daily.  . metoprolol tartrate (LOPRESSOR) 50 MG tablet Take 50 mg by mouth 2 (two) times daily.   . Multiple Vitamin (MULTIVITAMIN) tablet Take 1 tablet by mouth daily.  . Nivolumab (OPDIVO IV) Inject into the vein every 21 ( twenty-one) days.   . rivaroxaban (XARELTO) 20 MG TABS tablet Take 1 tablet (20 mg total) by mouth daily with supper.  . [DISCONTINUED] KLOR-CON M20 20 MEQ tablet TAKE 1 TABLET BY MOUTH EVERY DAY  . [DISCONTINUED] metoprolol succinate (TOPROL-XL) 50 MG 24 hr tablet TAKE 1 TABLET BY MOUTH TWICE A DAY WITH FOOD  . [DISCONTINUED] KLOR-CON M20 20 MEQ tablet TAKE 1 TABLET BY MOUTH EVERY DAY  . [DISCONTINUED] Multiple Vitamins-Minerals (ONE DAILY CALCIUM/IRON) TABS Take by mouth.   No facility-administered encounter medications on file as of 12/18/2018.     ALLERGIES:  No Known Allergies   PHYSICAL EXAM:  ECOG Performance status: 1  Vitals:   12/18/18  0946  BP: 137/73  Pulse: 84  Resp: 18  Temp: 97.8 F (36.6 C)  SpO2: 97%   Filed Weights   12/18/18 0946  Weight: 246 lb 12.8 oz (111.9 kg)    Physical Exam Vitals signs reviewed.  Constitutional:      Appearance: Normal appearance.  Cardiovascular:     Rate and Rhythm: Normal rate and regular rhythm.     Heart sounds: Normal heart sounds.  Pulmonary:     Effort: Pulmonary effort is normal.     Breath sounds: Normal breath sounds.  Abdominal:  General: There is no distension.     Palpations: Abdomen is soft. There is no mass.  Musculoskeletal:        General: No swelling.  Lymphadenopathy:     Cervical: No cervical adenopathy.  Skin:    General: Skin is warm.  Neurological:     General: No focal deficit present.     Mental Status: He is alert and oriented to person, place, and time.  Psychiatric:        Mood and Affect: Mood normal.        Behavior: Behavior normal.      LABORATORY DATA:  I have reviewed the labs as listed.  CBC    Component Value Date/Time   WBC 10.5 12/18/2018 0911   RBC 4.62 12/18/2018 0911   HGB 14.0 12/18/2018 0911   HCT 44.8 12/18/2018 0911   PLT 237 12/18/2018 0911   MCV 97.0 12/18/2018 0911   MCH 30.3 12/18/2018 0911   MCHC 31.3 12/18/2018 0911   RDW 16.0 (H) 12/18/2018 0911   LYMPHSABS 2.9 12/18/2018 0911   MONOABS 0.9 12/18/2018 0911   EOSABS 0.2 12/18/2018 0911   BASOSABS 0.0 12/18/2018 0911   CMP Latest Ref Rng & Units 12/18/2018 11/17/2018 10/20/2018  Glucose 70 - 99 mg/dL 113(H) 126(H) 142(H)  BUN 8 - 23 mg/dL 15 21 17   Creatinine 0.61 - 1.24 mg/dL 1.29(H) 1.33(H) 1.18  Sodium 135 - 145 mmol/L 140 137 136  Potassium 3.5 - 5.1 mmol/L 4.7 4.6 4.3  Chloride 98 - 111 mmol/L 105 106 104  CO2 22 - 32 mmol/L 26 22 22   Calcium 8.9 - 10.3 mg/dL 9.5 8.9 9.2  Total Protein 6.5 - 8.1 g/dL 7.4 7.4 7.4  Total Bilirubin 0.3 - 1.2 mg/dL 1.2 0.7 1.0  Alkaline Phos 38 - 126 U/L 67 72 72  AST 15 - 41 U/L 27 25 22   ALT 0 - 44 U/L 19  19 16        DIAGNOSTIC IMAGING:  I have independently reviewed the scans and discussed with the patient.   I have reviewed Venita Lick LPN's note and agree with the documentation.  I personally performed a face-to-face visit, made revisions and my assessment and plan is as follows.    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 -Opdivo every 4 weeks started on 03/10/2018. -He is tolerating immunotherapy very well without any major side effects. - PET scan from 11/13/2018 shows right upper lobe lung nodule measuring 1.5 cm, SUV 9.5.  New mass in the right adrenal gland measuring 1.9 cm with SUV of 9.2.  Nodule in the right anterior abdominal wall below the rib cage measures 1.9 x 1.5 cm, increased in size by 3 mm compared to prior scan.  Sclerotic bone metastases are stable with no hypermetabolic activity. - He was evaluated by Dr. Theadore Nan in [place].  He is having fiducials in the lung placed on 12/20/2018.  This will be followed by CyberKnife therapy to all 3 areas. -I have reviewed his blood work which is more or less stable. -She will proceed with his next cycle.  I will see him back in 4 weeks for follow-up.  2.  Atrial fibrillation: -He will continue Xarelto.  No bleeding episodes reported.  3.  Bone metastasis: - He will continue denosumab.  He is not having any  problems.  He will continue calcium and vitamin D.  Total time spent is 25 minutes with more than 50% of the spent face-to-face discussing treatment plan, counseling and coordination of care.    Orders placed this encounter:  No orders of the defined types were placed in this encounter.     Derek Jack, MD [place] [phone]

## 2018-12-18 NOTE — Patient Instructions (Signed)
[facility] Discharge Instructions for Patients Receiving Chemotherapy  Today you received the following chemotherapy agents   To help prevent nausea and vomiting after your treatment, we encourage you to take your nausea medication   If you develop nausea and vomiting that is not controlled by your nausea medication, call the clinic.   BELOW ARE SYMPTOMS THAT SHOULD BE REPORTED IMMEDIATELY:  *FEVER GREATER THAN 100.5 F  *CHILLS WITH OR WITHOUT FEVER  NAUSEA AND VOMITING THAT IS NOT CONTROLLED WITH YOUR NAUSEA MEDICATION  *UNUSUAL SHORTNESS OF BREATH  *UNUSUAL BRUISING OR BLEEDING  TENDERNESS IN MOUTH AND THROAT WITH OR WITHOUT PRESENCE OF ULCERS  *URINARY PROBLEMS  *BOWEL PROBLEMS  UNUSUAL RASH Items with * indicate a potential emergency and should be followed up as soon as possible.  Feel free to call the clinic should you have any questions or concerns. The clinic phone number is [phone].  Please show the CHEMO ALERT CARD at check-in to the Emergency Department and triage nurse.   

## 2018-12-24 NOTE — Assessment & Plan Note (Signed)
1.  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 -Opdivo every 4 weeks started on 03/10/2018. -He is tolerating immunotherapy very well without any major side effects. - PET scan from 11/13/2018 shows right upper lobe lung nodule measuring 1.5 cm, SUV 9.5.  New mass in the right adrenal gland measuring 1.9 cm with SUV of 9.2.  Nodule in the right anterior abdominal wall below the rib cage measures 1.9 x 1.5 cm, increased in size by 3 mm compared to prior scan.  Sclerotic bone metastases are stable with no hypermetabolic activity. - He was evaluated by Dr. Theadore Nan in [place].  He is having fiducials in the lung placed on 12/20/2018.  This will be followed by CyberKnife therapy to all 3 areas. -I have reviewed his blood work which is more or less stable. -She will proceed with his next cycle.  I will see him back in 4 weeks for follow-up.  2.  Atrial fibrillation: -He will continue Xarelto.  No bleeding episodes reported.  3.  Bone metastasis: - He will continue denosumab.  He is not having any problems.  He will continue calcium and vitamin D.

## 2019-01-09 ENCOUNTER — Other Ambulatory Visit (HOSPITAL_COMMUNITY): Payer: Medicare Other

## 2019-01-09 ENCOUNTER — Ambulatory Visit (HOSPITAL_COMMUNITY): Payer: Medicare Other

## 2019-01-09 ENCOUNTER — Ambulatory Visit (HOSPITAL_COMMUNITY): Payer: Medicare Other | Admitting: Hematology

## 2019-01-14 ENCOUNTER — Other Ambulatory Visit (HOSPITAL_COMMUNITY): Payer: Self-pay | Admitting: Nurse Practitioner

## 2019-01-14 DIAGNOSIS — C7801 Secondary malignant neoplasm of right lung: Secondary | ICD-10-CM

## 2019-01-15 ENCOUNTER — Ambulatory Visit (HOSPITAL_COMMUNITY): Payer: Medicare Other | Admitting: Hematology

## 2019-01-15 ENCOUNTER — Ambulatory Visit (HOSPITAL_COMMUNITY): Payer: Medicare Other

## 2019-01-15 ENCOUNTER — Other Ambulatory Visit (HOSPITAL_COMMUNITY): Payer: Medicare Other

## 2019-01-22 DIAGNOSIS — I10 Essential (primary) hypertension: Secondary | ICD-10-CM | POA: Insufficient documentation

## 2019-01-22 NOTE — Progress Notes (Signed)
HPI The patient presents for evaluation of atrial fibrillation.  He failed cardioversion in the past.  Since I last saw him he is being managed for metastatic melanoma.  He is having radiation and has received ipilimumab and is on nivolumab.  The patient denies any new symptoms such as chest discomfort, neck or arm discomfort. There has been no new shortness of breath, PND or orthopnea. There have been no reported palpitations, presyncope or syncope.    No Known Allergies  Current Outpatient Medications  Medication Sig Dispense Refill  . allopurinol (ZYLOPRIM) 300 MG tablet Take 300 mg by mouth daily.    Marland Kitchen diltiazem (CARDIZEM CD) 240 MG 24 hr capsule TAKE 1 CAPSULE (240 MG TOTAL) BY MOUTH EVERY DAY 90 capsule 0  . flurazepam (DALMANE) 30 MG capsule Take 30 mg by mouth at bedtime.    . gabapentin (NEURONTIN) 300 MG capsule Take 300 mg by mouth 3 (three) times daily.    Marland Kitchen Ipilimumab (YERVOY IV) Inject into the vein every 21 ( twenty-one) days.     Marland Kitchen KLOR-CON M20 20 MEQ tablet TAKE 1 TABLET BY MOUTH EVERY DAY 30 tablet 1  . lisinopril (PRINIVIL,ZESTRIL) 40 MG tablet Take 40 mg by mouth daily.     . magnesium oxide (MAG-OX) 400 MG tablet TAKE 1 TABLET BY MOUTH TWICE A DAY 60 tablet 1  . metFORMIN (GLUCOPHAGE) 500 MG tablet Take 500 mg by mouth daily.  3  . metoprolol tartrate (LOPRESSOR) 50 MG tablet Take 50 mg by mouth 2 (two) times daily.     . Multiple Vitamin (MULTIVITAMIN) tablet Take 1 tablet by mouth daily.    . Nivolumab (OPDIVO IV) Inject into the vein every 21 ( twenty-one) days.     . rivaroxaban (XARELTO) 20 MG TABS tablet Take 1 tablet (20 mg total) by mouth daily with supper. 90 tablet 1   No current facility-administered medications for this visit.     Past Medical History:  Diagnosis Date  . Arthritis, degenerative   . Bone metastases ([place]) 12/29/2017  . Calculus of kidney   . Gout, unspecified   . Skin cancer   . Unspecified essential hypertension     Past  Surgical History:  Procedure Laterality Date  . None      ROS:    As stated in the HPI and negative for all other systems.  PHYSICAL EXAM BP 110/80   Pulse 77   Ht 6' (1.829 m)   Wt 247 lb (112 kg)   BMI 33.50 kg/m   GENERAL:  Well appearing NECK:  No jugular venous distention, waveform within normal limits, carotid upstroke brisk and symmetric, no bruits, no thyromegaly LUNGS:  Clear to auscultation bilaterally CHEST:  Unremarkable HEART:  PMI not displaced or sustained,S1 and S2 within normal limits, no S3, no clicks, no rubs, no murmurs, irregular ABD:  Flat, positive bowel sounds normal in frequency in pitch, no bruits, no rebound, no guarding, no midline pulsatile mass, no hepatomegaly, no splenomegaly EXT:  2 plus pulses throughout, no edema, no cyanosis no clubbing  EKG:  Atrial fib with ventricular rate 77, anterior R wave progression, no acute ST-T wave changes.  Poor anterior R wave progression  01/24/2019   ASSESSMENT AND PLAN  Atrial fibrillation -  Mr. Dixie Jafri has a CHA2DS2 - VASc score of 3.   He has no symptoms.  We talked again about the fact that if he has any problems with lower heart rate,  presyncope or low blood pressures we could cut down his Cardizem.  Currently seems to be doing well on the current dose so I will continue this.   HTN - This is being managed in the context of treating his fibrillation.   DYSLIPIDEMIA - Somehow his Lipitor get stopped.  However, not have recent lipids on him.  He is going through significant therapy and I do not think that restarting Lipitor is necessarily pivotal at this point and therefore, I will stay off of it for now.

## 2019-01-24 ENCOUNTER — Other Ambulatory Visit: Payer: Self-pay

## 2019-01-24 ENCOUNTER — Ambulatory Visit (INDEPENDENT_AMBULATORY_CARE_PROVIDER_SITE_OTHER): Payer: Medicare Other | Admitting: Cardiology

## 2019-01-24 ENCOUNTER — Other Ambulatory Visit (HOSPITAL_COMMUNITY): Payer: Self-pay | Admitting: *Deleted

## 2019-01-24 ENCOUNTER — Encounter: Payer: Self-pay | Admitting: Cardiology

## 2019-01-24 VITALS — BP 110/80 | HR 77 | Ht 72.0 in | Wt 247.0 lb

## 2019-01-24 DIAGNOSIS — I482 Chronic atrial fibrillation, unspecified: Secondary | ICD-10-CM | POA: Diagnosis not present

## 2019-01-24 DIAGNOSIS — D500 Iron deficiency anemia secondary to blood loss (chronic): Secondary | ICD-10-CM

## 2019-01-24 DIAGNOSIS — I10 Essential (primary) hypertension: Secondary | ICD-10-CM | POA: Diagnosis not present

## 2019-01-24 DIAGNOSIS — C7801 Secondary malignant neoplasm of right lung: Secondary | ICD-10-CM

## 2019-01-24 DIAGNOSIS — C7951 Secondary malignant neoplasm of bone: Secondary | ICD-10-CM

## 2019-01-24 NOTE — Patient Instructions (Signed)
Medication Instructions:  The current medical regimen is effective;  continue present plan and medications.  *If you need a refill on your cardiac medications before your next appointment, please call your pharmacy*  Follow-Up: At [facility], you and your health needs are our priority.  As part of our continuing mission to provide you with exceptional heart care, we have created designated Provider Care Teams.  These Care Teams include your primary Cardiologist (physician) and Advanced Practice Providers (APPs -  Physician Assistants and Nurse Practitioners) who all work together to provide you with the care you need, when you need it.  Your next appointment:   12 months  The format for your next appointment:   In Person  Provider:   In [place] with Dr Percival Spanish  Thank you for choosing [facility]!!

## 2019-01-25 ENCOUNTER — Encounter (HOSPITAL_COMMUNITY): Payer: Self-pay | Admitting: Hematology

## 2019-01-25 ENCOUNTER — Other Ambulatory Visit: Payer: Self-pay

## 2019-01-25 ENCOUNTER — Inpatient Hospital Stay (HOSPITAL_COMMUNITY): Payer: Medicare Other | Attending: Hematology

## 2019-01-25 ENCOUNTER — Inpatient Hospital Stay (HOSPITAL_COMMUNITY): Payer: Medicare Other

## 2019-01-25 ENCOUNTER — Inpatient Hospital Stay (HOSPITAL_BASED_OUTPATIENT_CLINIC_OR_DEPARTMENT_OTHER): Payer: Medicare Other | Admitting: Hematology

## 2019-01-25 VITALS — BP 110/55 | HR 69 | Temp 96.9°F | Resp 18

## 2019-01-25 DIAGNOSIS — Z803 Family history of malignant neoplasm of breast: Secondary | ICD-10-CM | POA: Insufficient documentation

## 2019-01-25 DIAGNOSIS — Z818 Family history of other mental and behavioral disorders: Secondary | ICD-10-CM | POA: Diagnosis not present

## 2019-01-25 DIAGNOSIS — Z79899 Other long term (current) drug therapy: Secondary | ICD-10-CM | POA: Diagnosis not present

## 2019-01-25 DIAGNOSIS — C7801 Secondary malignant neoplasm of right lung: Secondary | ICD-10-CM

## 2019-01-25 DIAGNOSIS — Z8249 Family history of ischemic heart disease and other diseases of the circulatory system: Secondary | ICD-10-CM | POA: Diagnosis not present

## 2019-01-25 DIAGNOSIS — C7951 Secondary malignant neoplasm of bone: Secondary | ICD-10-CM

## 2019-01-25 DIAGNOSIS — Z85828 Personal history of other malignant neoplasm of skin: Secondary | ICD-10-CM | POA: Diagnosis not present

## 2019-01-25 DIAGNOSIS — R531 Weakness: Secondary | ICD-10-CM | POA: Diagnosis not present

## 2019-01-25 DIAGNOSIS — M1990 Unspecified osteoarthritis, unspecified site: Secondary | ICD-10-CM | POA: Insufficient documentation

## 2019-01-25 DIAGNOSIS — Z811 Family history of alcohol abuse and dependence: Secondary | ICD-10-CM | POA: Diagnosis not present

## 2019-01-25 DIAGNOSIS — M549 Dorsalgia, unspecified: Secondary | ICD-10-CM | POA: Insufficient documentation

## 2019-01-25 DIAGNOSIS — D500 Iron deficiency anemia secondary to blood loss (chronic): Secondary | ICD-10-CM

## 2019-01-25 DIAGNOSIS — C3411 Malignant neoplasm of upper lobe, right bronchus or lung: Secondary | ICD-10-CM | POA: Insufficient documentation

## 2019-01-25 DIAGNOSIS — I4891 Unspecified atrial fibrillation: Secondary | ICD-10-CM | POA: Diagnosis not present

## 2019-01-25 DIAGNOSIS — Z823 Family history of stroke: Secondary | ICD-10-CM | POA: Insufficient documentation

## 2019-01-25 DIAGNOSIS — Z87442 Personal history of urinary calculi: Secondary | ICD-10-CM | POA: Diagnosis not present

## 2019-01-25 DIAGNOSIS — E669 Obesity, unspecified: Secondary | ICD-10-CM | POA: Insufficient documentation

## 2019-01-25 DIAGNOSIS — I10 Essential (primary) hypertension: Secondary | ICD-10-CM | POA: Insufficient documentation

## 2019-01-25 DIAGNOSIS — M2550 Pain in unspecified joint: Secondary | ICD-10-CM | POA: Insufficient documentation

## 2019-01-25 DIAGNOSIS — Z800 Family history of malignant neoplasm of digestive organs: Secondary | ICD-10-CM | POA: Diagnosis not present

## 2019-01-25 DIAGNOSIS — R5383 Other fatigue: Secondary | ICD-10-CM | POA: Insufficient documentation

## 2019-01-25 DIAGNOSIS — Z7901 Long term (current) use of anticoagulants: Secondary | ICD-10-CM | POA: Insufficient documentation

## 2019-01-25 LAB — COMPREHENSIVE METABOLIC PANEL
ALT: 15 U/L (ref 0–44)
AST: 22 U/L (ref 15–41)
Albumin: 3.7 g/dL (ref 3.5–5.0)
Alkaline Phosphatase: 65 U/L (ref 38–126)
Anion gap: 9 (ref 5–15)
BUN: 20 mg/dL (ref 8–23)
CO2: 24 mmol/L (ref 22–32)
Calcium: 9 mg/dL (ref 8.9–10.3)
Chloride: 106 mmol/L (ref 98–111)
Creatinine, Ser: 1.24 mg/dL (ref 0.61–1.24)
GFR calc Af Amer: >60 mL/min (ref >60)
GFR calc non Af Amer: 57 mL/min — ABNORMAL LOW (ref >60)
Glucose, Bld: 123 mg/dL — ABNORMAL HIGH (ref 70–99)
Potassium: 5 mmol/L (ref 3.5–5.1)
Sodium: 139 mmol/L (ref 135–145)
Total Bilirubin: 1 mg/dL (ref 0.3–1.2)
Total Protein: 7 g/dL (ref 6.5–8.1)

## 2019-01-25 LAB — CBC WITH DIFFERENTIAL/PLATELET
Abs Immature Granulocytes: 0.01 10*3/uL (ref 0.00–0.07)
Basophils Absolute: 0 10*3/uL (ref 0.0–0.1)
Basophils Relative: 1 %
Eosinophils Absolute: 0.1 10*3/uL (ref 0.0–0.5)
Eosinophils Relative: 1 %
HCT: 43.2 % (ref 39.0–52.0)
Hemoglobin: 13.7 g/dL (ref 13.0–17.0)
Immature Granulocytes: 0 %
Lymphocytes Relative: 16 %
Lymphs Abs: 0.9 10*3/uL (ref 0.7–4.0)
MCH: 30.6 pg (ref 26.0–34.0)
MCHC: 31.7 g/dL (ref 30.0–36.0)
MCV: 96.4 fL (ref 80.0–100.0)
Monocytes Absolute: 0.7 10*3/uL (ref 0.1–1.0)
Monocytes Relative: 13 %
Neutro Abs: 4 10*3/uL (ref 1.7–7.7)
Neutrophils Relative %: 69 %
Platelets: 193 10*3/uL (ref 150–400)
RBC: 4.48 MIL/uL (ref 4.22–5.81)
RDW: 14.5 % (ref 11.5–15.5)
WBC: 5.8 10*3/uL (ref 4.0–10.5)
nRBC: 0 % (ref 0.0–0.2)

## 2019-01-25 LAB — TSH: TSH: 0.968 u[IU]/mL (ref 0.350–4.500)

## 2019-01-25 MED ORDER — DENOSUMAB 120 MG/1.7ML ~~LOC~~ SOLN
120.0000 mg | Freq: Once | SUBCUTANEOUS | Status: AC
  Administered 2019-01-25: 120 mg via SUBCUTANEOUS
  Filled 2019-01-25: qty 1.7

## 2019-01-25 MED ORDER — SODIUM CHLORIDE 0.9 % IV SOLN
480.0000 mg | Freq: Once | INTRAVENOUS | Status: AC
  Administered 2019-01-25: 480 mg via INTRAVENOUS
  Filled 2019-01-25: qty 48

## 2019-01-25 MED ORDER — SODIUM CHLORIDE 0.9 % IV SOLN
INTRAVENOUS | Status: DC
  Administered 2019-01-25: 10:00:00 via INTRAVENOUS

## 2019-01-25 NOTE — Patient Instructions (Signed)
[facility] Discharge Instructions for Patients Receiving Chemotherapy   Beginning January 23rd 2017 lab work for the [facility] will be done in the  Main lab at [facility] on 1st floor. If you have a lab appointment with the [facility] please come in thru the  Main Entrance and check in at the main information desk   Today you received the following chemotherapy agents Opdivo as well as Xgeva injection. Follow-up as scheduled. Call clinic for any questions or concerns  To help prevent nausea and vomiting after your treatment, we encourage you to take your nausea medication   If you develop nausea and vomiting, or diarrhea that is not controlled by your medication, call the clinic.  The clinic phone number is [phone]. Office hours are Monday-Friday 8:30am-5:00pm.  BELOW ARE SYMPTOMS THAT SHOULD BE REPORTED IMMEDIATELY:  *FEVER GREATER THAN 101.0 F  *CHILLS WITH OR WITHOUT FEVER  NAUSEA AND VOMITING THAT IS NOT CONTROLLED WITH YOUR NAUSEA MEDICATION  *UNUSUAL SHORTNESS OF BREATH  *UNUSUAL BRUISING OR BLEEDING  TENDERNESS IN MOUTH AND THROAT WITH OR WITHOUT PRESENCE OF ULCERS  *URINARY PROBLEMS  *BOWEL PROBLEMS  UNUSUAL RASH Items with * indicate a potential emergency and should be followed up as soon as possible. If you have an emergency after office hours please contact your primary care physician or go to the nearest emergency department.  Please call the clinic during office hours if you have any questions or concerns.   You may also contact the Patient Navigator at [phone] should you have any questions or need assistance in obtaining follow up care.      Resources For Cancer Patients and their Caregivers ? American Cancer Society: Can assist with transportation, wigs, general needs, runs Look Good Feel Better.        602 319 6010 ? Cancer Care: Provides financial assistance, online support groups, medication/co-pay assistance.   1-800-813-HOPE 417 684 5179) ? [employer] Assists [place] cancer patients and their families through emotional , educational and financial support.  873 661 1396 ? [place] DSS Where to apply for food stamps, Medicaid and utility assistance. 415-712-9481 ? [facility]: Transportation to medical appointments. (309) 096-8002 ? Social Security Administration: May apply for disability if have a Stage IV cancer. (979) 886-2576 660-449-2617 ? [employer], Disability and Transit Services: Assists with nutrition, care and transit needs. (904)707-5238

## 2019-01-25 NOTE — Progress Notes (Signed)
0910 Labs reviewed with and pt seen by [facility] NP and pt approved for Opdivo infusion and Xgeva injection today per NP                                                                                             Christopher Baird tolerated Opdivo infusion and Xgeva injection well without complaints or incident. Peripheral IV site checked by 2 RNs with good blood return noted prior to and after Opdivo infusion. Pt denied any tooth or jaw pain and no recent or future dental visits prior to administering Xgeva injection. VSS upon discharge. Pt discharged self ambulatory in satisfactory condition

## 2019-01-25 NOTE — Progress Notes (Signed)
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, right ([place])   12/08/2017 -  Chemotherapy   The patient had ipilimumab (YERVOY) 350 mg in sodium chloride 0.9 % 150 mL chemo infusion, 3 mg/kg = 350 mg, Intravenous,  Once, 4 of 4 cycles Administration: 350 mg (12/08/2017), 350 mg (12/29/2017), 350 mg (01/20/2018), 350 mg (02/15/2018) nivolumab (OPDIVO) 116 mg in sodium chloride 0.9 % 100 mL chemo infusion, 1 mg/kg = 116 mg, Intravenous, Once, 16 of 18 cycles Dose modification: 480 mg (original dose 480 mg, Cycle 5, Reason: Other (see comments)) Administration: 116 mg (12/08/2017), 480 mg (03/10/2018), 116 mg (12/29/2017), 116 mg (01/20/2018), 100 mg (02/15/2018), 480 mg (04/07/2018), 480 mg (05/05/2018), 480 mg (06/02/2018), 480 mg (06/30/2018), 480 mg (07/28/2018), 480 mg (08/25/2018), 480 mg (09/22/2018), 480 mg (10/20/2018), 480 mg (11/17/2018), 480 mg (12/18/2018)  for chemotherapy treatment.       INTERVAL HISTORY:  Christopher Baird 73 y.o. male presents today for follow-up.  He reports overall doing well.  Patient completed CyberKnife therapy in [place].  He tolerated it well.  He reports fatigue secondary to radiation is nearly resolved.  He denies any new cough.  Denies any chest pain or shortness of breath.  He states he is ready to proceed with treatment today.  REVIEW OF SYSTEMS:  Review of Systems  Constitutional: Positive for fatigue.  HENT:  Negative.   Eyes: Negative.   Respiratory: Negative.   Cardiovascular: Negative.   Gastrointestinal: Negative.   Genitourinary: Negative.    Musculoskeletal: Positive for arthralgias  and back pain.  Skin: Negative.   Neurological: Positive for extremity weakness.  Hematological: Negative.   Psychiatric/Behavioral: Negative.      PAST MEDICAL/SURGICAL HISTORY:  Past Medical History:  Diagnosis Date  . Arthritis, degenerative   . Bone metastases ([place]) 12/29/2017  . Calculus of kidney   . Gout, unspecified   . Skin cancer   . Unspecified essential hypertension    Past Surgical History:  Procedure Laterality Date  . None       SOCIAL HISTORY:  Social History   Socioeconomic History  . Marital status: Married    Spouse name: Not on file  . Number of children: 1  . Years of education: Not on file  . Highest education level: Not on file  Occupational History  . Occupation: Retired    Comment: Chief Financial Officer  Social Needs  . Financial resource strain: Not hard at all  . Food insecurity    Worry: Never true    Inability: Never true  . Transportation needs    Medical: No    Non-medical: No  Tobacco Use  . Smoking status: Never Smoker  . Smokeless tobacco: Never Used  Substance and Sexual Activity  . Alcohol use: No  . Drug use: No  . Sexual activity: Not on file  Lifestyle  . Physical activity    Days per week: 0 days    Minutes per session: 0 min  . Stress: To some extent  Relationships  . Social connections    Talks on phone: More than three times a week  Gets together: Once a week    Attends religious service: 1 to 4 times per year    Active member of club or organization: No    Attends meetings of clubs or organizations: Never    Relationship status: Married  . Intimate partner violence    Fear of current or ex partner: No    Emotionally abused: No    Physically abused: No    Forced sexual activity: No  Other Topics Concern  . Not on file  Social History Narrative  . Not on file    FAMILY HISTORY:  Family History  Problem Relation Age of Onset  . Heart disease Mother   . Colon cancer Mother   . Stroke  Mother   . Alcohol abuse Father        Cause of death is Suicide  . Breast cancer Sister   . Mental illness Paternal Aunt   . Alcohol abuse Paternal Uncle     CURRENT MEDICATIONS:  Outpatient Encounter Medications as of 01/25/2019  Medication Sig  . allopurinol (ZYLOPRIM) 300 MG tablet Take 300 mg by mouth daily.  Marland Kitchen diltiazem (CARDIZEM CD) 240 MG 24 hr capsule TAKE 1 CAPSULE (240 MG TOTAL) BY MOUTH EVERY DAY  . flurazepam (DALMANE) 30 MG capsule Take 30 mg by mouth at bedtime.  . gabapentin (NEURONTIN) 300 MG capsule Take 300 mg by mouth 3 (three) times daily.  Marland Kitchen Ipilimumab (YERVOY IV) Inject into the vein every 21 ( twenty-one) days.   Marland Kitchen KLOR-CON M20 20 MEQ tablet TAKE 1 TABLET BY MOUTH EVERY DAY  . lisinopril (PRINIVIL,ZESTRIL) 40 MG tablet Take 40 mg by mouth daily.   . magnesium oxide (MAG-OX) 400 MG tablet TAKE 1 TABLET BY MOUTH TWICE A DAY  . metFORMIN (GLUCOPHAGE) 500 MG tablet Take 500 mg by mouth daily.  . metoprolol tartrate (LOPRESSOR) 50 MG tablet Take 50 mg by mouth 2 (two) times daily.   . Multiple Vitamin (MULTIVITAMIN) tablet Take 1 tablet by mouth daily.  . Nivolumab (OPDIVO IV) Inject into the vein every 21 ( twenty-one) days.   . rivaroxaban (XARELTO) 20 MG TABS tablet Take 1 tablet (20 mg total) by mouth daily with supper.  . [DISCONTINUED] KLOR-CON M20 20 MEQ tablet TAKE 1 TABLET BY MOUTH EVERY DAY   No facility-administered encounter medications on file as of 01/25/2019.     ALLERGIES:  No Known Allergies   PHYSICAL EXAM:  ECOG Performance status: 1  Vitals:   01/25/19 0842  BP: 122/64  Pulse: 74  Resp: 18  Temp: 97.8 F (36.6 C)  SpO2: 98%   Filed Weights   01/25/19 0842  Weight: 245 lb 9.6 oz (111.4 kg)    Physical Exam Constitutional:      Appearance: Normal appearance. He is obese.  HENT:     Head: Normocephalic.     Right Ear: External ear normal.     Left Ear: External ear normal.     Nose: Nose normal.     Mouth/Throat:      Pharynx: Oropharynx is clear.  Eyes:     Conjunctiva/sclera: Conjunctivae normal.  Neck:     Musculoskeletal: Normal range of motion.  Cardiovascular:     Rate and Rhythm: Normal rate and regular rhythm.     Pulses: Normal pulses.     Heart sounds: Normal heart sounds.  Pulmonary:     Effort: Pulmonary effort is normal.     Breath sounds: Normal breath sounds.  Abdominal:  General: Bowel sounds are normal.  Musculoskeletal:     Comments: Decreased range of motion  Skin:    General: Skin is warm.  Neurological:     General: No focal deficit present.     Mental Status: He is alert and oriented to person, place, and time. Mental status is at baseline.  Psychiatric:        Mood and Affect: Mood normal.        Behavior: Behavior normal.        Thought Content: Thought content normal.        Judgment: Judgment normal.      LABORATORY DATA:  I have reviewed the labs as listed.  CBC    Component Value Date/Time   WBC 5.8 01/25/2019 0805   RBC 4.48 01/25/2019 0805   HGB 13.7 01/25/2019 0805   HCT 43.2 01/25/2019 0805   PLT 193 01/25/2019 0805   MCV 96.4 01/25/2019 0805   MCH 30.6 01/25/2019 0805   MCHC 31.7 01/25/2019 0805   RDW 14.5 01/25/2019 0805   LYMPHSABS 0.9 01/25/2019 0805   MONOABS 0.7 01/25/2019 0805   EOSABS 0.1 01/25/2019 0805   BASOSABS 0.0 01/25/2019 0805   CMP Latest Ref Rng & Units 01/25/2019 12/18/2018 11/17/2018  Glucose 70 - 99 mg/dL 123(H) 113(H) 126(H)  BUN 8 - 23 mg/dL 20 15 21   Creatinine 0.61 - 1.24 mg/dL 1.24 1.29(H) 1.33(H)  Sodium 135 - 145 mmol/L 139 140 137  Potassium 3.5 - 5.1 mmol/L 5.0 4.7 4.6  Chloride 98 - 111 mmol/L 106 105 106  CO2 22 - 32 mmol/L 24 26 22   Calcium 8.9 - 10.3 mg/dL 9.0 9.5 8.9  Total Protein 6.5 - 8.1 g/dL 7.0 7.4 7.4  Total Bilirubin 0.3 - 1.2 mg/dL 1.0 1.2 0.7  Alkaline Phos 38 - 126 U/L 65 67 72  AST 15 - 41 U/L 22 27 25   ALT 0 - 44 U/L 15 19 19          ASSESSMENT & PLAN:   Metastatic melanoma to  lung, right ([place]) 1.  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 -Opdivo every 4 weeks started on 03/10/2018. -He is tolerating immunotherapy very well without any major side effects. - PET scan from 11/13/2018 shows right upper lobe lung nodule measuring 1.5 cm, SUV 9.5.  New mass in the right adrenal gland measuring 1.9 cm with SUV of 9.2.  Nodule in the right anterior abdominal wall below the rib cage measures 1.9 x 1.5 cm, increased in size by 3 mm compared to prior scan.  Sclerotic bone metastases are stable with no hypermetabolic activity. - He was evaluated by Dr. Theadore Nan in [place].  He is having fiducials in the lung placed on 12/20/2018.  This will be followed by CyberKnife therapy to all 3 areas.  He completed CyberKnife therapy on 01/21/2019 -I have reviewed his blood work which is more or less stable. -He will proceed with treatment today.  Return to clinic in 4 weeks.  2.  Atrial fibrillation: -He will continue Xarelto.  No bleeding episodes reported.  3.  Bone metastasis: - He will continue denosumab.  He is not having any problems.  He will continue calcium and vitamin D.          [place] [phone]

## 2019-01-25 NOTE — Assessment & Plan Note (Signed)
1.  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 -Opdivo every 4 weeks started on 03/10/2018. -He is tolerating immunotherapy very well without any major side effects. - PET scan from 11/13/2018 shows right upper lobe lung nodule measuring 1.5 cm, SUV 9.5.  New mass in the right adrenal gland measuring 1.9 cm with SUV of 9.2.  Nodule in the right anterior abdominal wall below the rib cage measures 1.9 x 1.5 cm, increased in size by 3 mm compared to prior scan.  Sclerotic bone metastases are stable with no hypermetabolic activity. - He was evaluated by Dr. Theadore Nan in [place].  He is having fiducials in the lung placed on 12/20/2018.  This will be followed by CyberKnife therapy to all 3 areas.  He completed CyberKnife therapy on 01/21/2019 -I have reviewed his blood work which is more or less stable. -He will proceed with treatment today.  Return to clinic in 4 weeks.  2.  Atrial fibrillation: -He will continue Xarelto.  No bleeding episodes reported.  3.  Bone metastasis: - He will continue denosumab.  He is not having any problems.  He will continue calcium and vitamin D.

## 2019-02-18 ENCOUNTER — Other Ambulatory Visit (HOSPITAL_COMMUNITY): Payer: Self-pay | Admitting: Nurse Practitioner

## 2019-02-18 DIAGNOSIS — C7801 Secondary malignant neoplasm of right lung: Secondary | ICD-10-CM

## 2019-02-21 ENCOUNTER — Other Ambulatory Visit (HOSPITAL_COMMUNITY): Payer: Self-pay | Admitting: *Deleted

## 2019-02-21 DIAGNOSIS — C7801 Secondary malignant neoplasm of right lung: Secondary | ICD-10-CM

## 2019-02-21 DIAGNOSIS — C7951 Secondary malignant neoplasm of bone: Secondary | ICD-10-CM

## 2019-02-22 ENCOUNTER — Inpatient Hospital Stay (HOSPITAL_BASED_OUTPATIENT_CLINIC_OR_DEPARTMENT_OTHER): Payer: Medicare Other | Admitting: Hematology

## 2019-02-22 ENCOUNTER — Other Ambulatory Visit: Payer: Self-pay

## 2019-02-22 ENCOUNTER — Inpatient Hospital Stay (HOSPITAL_COMMUNITY): Payer: Medicare Other | Attending: Hematology

## 2019-02-22 ENCOUNTER — Encounter (HOSPITAL_COMMUNITY): Payer: Self-pay | Admitting: Hematology

## 2019-02-22 ENCOUNTER — Inpatient Hospital Stay (HOSPITAL_COMMUNITY): Payer: Medicare Other

## 2019-02-22 VITALS — BP 117/75 | HR 77 | Resp 18

## 2019-02-22 DIAGNOSIS — C7951 Secondary malignant neoplasm of bone: Secondary | ICD-10-CM | POA: Diagnosis present

## 2019-02-22 DIAGNOSIS — M1990 Unspecified osteoarthritis, unspecified site: Secondary | ICD-10-CM | POA: Insufficient documentation

## 2019-02-22 DIAGNOSIS — C7801 Secondary malignant neoplasm of right lung: Secondary | ICD-10-CM

## 2019-02-22 DIAGNOSIS — M549 Dorsalgia, unspecified: Secondary | ICD-10-CM | POA: Diagnosis not present

## 2019-02-22 DIAGNOSIS — C3411 Malignant neoplasm of upper lobe, right bronchus or lung: Secondary | ICD-10-CM | POA: Insufficient documentation

## 2019-02-22 DIAGNOSIS — Z85828 Personal history of other malignant neoplasm of skin: Secondary | ICD-10-CM | POA: Insufficient documentation

## 2019-02-22 DIAGNOSIS — F0390 Unspecified dementia without behavioral disturbance: Secondary | ICD-10-CM | POA: Insufficient documentation

## 2019-02-22 DIAGNOSIS — Z800 Family history of malignant neoplasm of digestive organs: Secondary | ICD-10-CM | POA: Insufficient documentation

## 2019-02-22 DIAGNOSIS — Z7901 Long term (current) use of anticoagulants: Secondary | ICD-10-CM | POA: Insufficient documentation

## 2019-02-22 DIAGNOSIS — Z87442 Personal history of urinary calculi: Secondary | ICD-10-CM | POA: Insufficient documentation

## 2019-02-22 DIAGNOSIS — Z8249 Family history of ischemic heart disease and other diseases of the circulatory system: Secondary | ICD-10-CM | POA: Diagnosis not present

## 2019-02-22 DIAGNOSIS — Z811 Family history of alcohol abuse and dependence: Secondary | ICD-10-CM | POA: Insufficient documentation

## 2019-02-22 DIAGNOSIS — Z79899 Other long term (current) drug therapy: Secondary | ICD-10-CM | POA: Diagnosis not present

## 2019-02-22 DIAGNOSIS — M2550 Pain in unspecified joint: Secondary | ICD-10-CM | POA: Insufficient documentation

## 2019-02-22 DIAGNOSIS — Z818 Family history of other mental and behavioral disorders: Secondary | ICD-10-CM | POA: Insufficient documentation

## 2019-02-22 DIAGNOSIS — Z803 Family history of malignant neoplasm of breast: Secondary | ICD-10-CM | POA: Insufficient documentation

## 2019-02-22 DIAGNOSIS — I4891 Unspecified atrial fibrillation: Secondary | ICD-10-CM | POA: Insufficient documentation

## 2019-02-22 DIAGNOSIS — Z823 Family history of stroke: Secondary | ICD-10-CM | POA: Diagnosis not present

## 2019-02-22 DIAGNOSIS — R5383 Other fatigue: Secondary | ICD-10-CM | POA: Insufficient documentation

## 2019-02-22 LAB — COMPREHENSIVE METABOLIC PANEL
ALT: 25 U/L (ref 0–44)
AST: 30 U/L (ref 15–41)
Albumin: 3.8 g/dL (ref 3.5–5.0)
Alkaline Phosphatase: 66 U/L (ref 38–126)
Anion gap: 11 (ref 5–15)
BUN: 16 mg/dL (ref 8–23)
CO2: 23 mmol/L (ref 22–32)
Calcium: 9 mg/dL (ref 8.9–10.3)
Chloride: 106 mmol/L (ref 98–111)
Creatinine, Ser: 1.2 mg/dL (ref 0.61–1.24)
GFR calc Af Amer: >60 mL/min (ref >60)
GFR calc non Af Amer: 59 mL/min — ABNORMAL LOW (ref >60)
Glucose, Bld: 128 mg/dL — ABNORMAL HIGH (ref 70–99)
Potassium: 4.3 mmol/L (ref 3.5–5.1)
Sodium: 140 mmol/L (ref 135–145)
Total Bilirubin: 0.8 mg/dL (ref 0.3–1.2)
Total Protein: 7.4 g/dL (ref 6.5–8.1)

## 2019-02-22 LAB — CBC WITH DIFFERENTIAL/PLATELET
Abs Immature Granulocytes: 0.01 10*3/uL (ref 0.00–0.07)
Basophils Absolute: 0 10*3/uL (ref 0.0–0.1)
Basophils Relative: 1 %
Eosinophils Absolute: 0.1 10*3/uL (ref 0.0–0.5)
Eosinophils Relative: 1 %
HCT: 44 % (ref 39.0–52.0)
Hemoglobin: 13.9 g/dL (ref 13.0–17.0)
Immature Granulocytes: 0 %
Lymphocytes Relative: 20 %
Lymphs Abs: 1.1 10*3/uL (ref 0.7–4.0)
MCH: 30.8 pg (ref 26.0–34.0)
MCHC: 31.6 g/dL (ref 30.0–36.0)
MCV: 97.3 fL (ref 80.0–100.0)
Monocytes Absolute: 0.6 10*3/uL (ref 0.1–1.0)
Monocytes Relative: 11 %
Neutro Abs: 3.7 10*3/uL (ref 1.7–7.7)
Neutrophils Relative %: 67 %
Platelets: 164 10*3/uL (ref 150–400)
RBC: 4.52 MIL/uL (ref 4.22–5.81)
RDW: 15.7 % — ABNORMAL HIGH (ref 11.5–15.5)
WBC: 5.5 10*3/uL (ref 4.0–10.5)
nRBC: 0 % (ref 0.0–0.2)

## 2019-02-22 LAB — TSH: TSH: 1.209 u[IU]/mL (ref 0.350–4.500)

## 2019-02-22 MED ORDER — SODIUM CHLORIDE 0.9 % IV SOLN
480.0000 mg | Freq: Once | INTRAVENOUS | Status: AC
  Administered 2019-02-22: 480 mg via INTRAVENOUS
  Filled 2019-02-22: qty 48

## 2019-02-22 MED ORDER — DENOSUMAB 120 MG/1.7ML ~~LOC~~ SOLN
120.0000 mg | Freq: Once | SUBCUTANEOUS | Status: AC
  Administered 2019-02-22: 120 mg via SUBCUTANEOUS
  Filled 2019-02-22: qty 1.7

## 2019-02-22 MED ORDER — SODIUM CHLORIDE 0.9 % IV SOLN
Freq: Once | INTRAVENOUS | Status: AC
  Administered 2019-02-22: 10:00:00 via INTRAVENOUS

## 2019-02-22 NOTE — Patient Instructions (Signed)
[facility] Discharge Instructions for Patients Receiving Chemotherapy   Beginning January 23rd 2017 lab work for the [facility] will be done in the  Main lab at [facility] on 1st floor. If you have a lab appointment with the [facility] please come in thru the  Main Entrance and check in at the main information desk   Today you received the following chemotherapy agents Opdivo.  To help prevent nausea and vomiting after your treatment, we encourage you to take your nausea medication   If you develop nausea and vomiting, or diarrhea that is not controlled by your medication, call the clinic.  The clinic phone number is [phone]. Office hours are Monday-Friday 8:30am-5:00pm.  BELOW ARE SYMPTOMS THAT SHOULD BE REPORTED IMMEDIATELY:  *FEVER GREATER THAN 101.0 F  *CHILLS WITH OR WITHOUT FEVER  NAUSEA AND VOMITING THAT IS NOT CONTROLLED WITH YOUR NAUSEA MEDICATION  *UNUSUAL SHORTNESS OF BREATH  *UNUSUAL BRUISING OR BLEEDING  TENDERNESS IN MOUTH AND THROAT WITH OR WITHOUT PRESENCE OF ULCERS  *URINARY PROBLEMS  *BOWEL PROBLEMS  UNUSUAL RASH Items with * indicate a potential emergency and should be followed up as soon as possible. If you have an emergency after office hours please contact your primary care physician or go to the nearest emergency department.  Please call the clinic during office hours if you have any questions or concerns.   You may also contact the Patient Navigator at [phone] should you have any questions or need assistance in obtaining follow up care.      Resources For Cancer Patients and their Caregivers ? American Cancer Society: Can assist with transportation, wigs, general needs, runs Look Good Feel Better.        9064262556 ? Cancer Care: Provides financial assistance, online support groups, medication/co-pay assistance.  1-800-813-HOPE 720 286 8154) ? [facility] Assists [place] cancer  patients and their families through emotional , educational and financial support.  508-227-9821 ? [place] DSS Where to apply for food stamps, Medicaid and utility assistance. 442-729-8112 ? [facility]: Transportation to medical appointments. 985 210 8372 ? Social Security Administration: May apply for disability if have a Stage IV cancer. 970-730-7075 520-301-9158 ? [employer], Disability and Transit Services: Assists with nutrition, care and transit needs. [phone]

## 2019-02-22 NOTE — Progress Notes (Signed)
Christopher Baird tolerated treatment without incident or complaint. VSS. Peripheral IV noted for positive blood return prior to, during, and post infusion by 2 RNs. Discharged in satisfactory condition.

## 2019-02-22 NOTE — Progress Notes (Signed)
0915 patient seen by Harriet Pho, NP and lab work reviewed. Per NP, ok to proceed with treatment today.

## 2019-02-22 NOTE — Assessment & Plan Note (Signed)
1.  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 -Opdivo every 4 weeks started on 03/10/2018. -He is tolerating immunotherapy very well without any major side effects. - PET scan from 11/13/2018 shows right upper lobe lung nodule measuring 1.5 cm, SUV 9.5.  New mass in the right adrenal gland measuring 1.9 cm with SUV of 9.2.  Nodule in the right anterior abdominal wall below the rib cage measures 1.9 x 1.5 cm, increased in size by 3 mm compared to prior scan.  Sclerotic bone metastases are stable with no hypermetabolic activity. - He was evaluated by Dr. Theadore Nan in [place].  He is having fiducials in the lung placed on 12/20/2018.  This will be followed by CyberKnife therapy to all 3 areas.  He completed CyberKnife therapy on 01/21/2019 -I have reviewed his blood work which is more or less stable. -He will proceed with treatment today.  Return to clinic in 4 weeks.  2.  Atrial fibrillation: -He will continue Xarelto.  No bleeding episodes reported.  3.  Bone metastasis: - He will continue denosumab.  He is not having any problems.  He will continue calcium and vitamin D.

## 2019-02-22 NOTE — Progress Notes (Signed)
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, right ([place])   12/08/2017 -  Chemotherapy   The patient had ipilimumab (YERVOY) 350 mg in sodium chloride 0.9 % 150 mL chemo infusion, 3 mg/kg = 350 mg, Intravenous,  Once, 4 of 4 cycles Administration: 350 mg (12/08/2017), 350 mg (12/29/2017), 350 mg (01/20/2018), 350 mg (02/15/2018) nivolumab (OPDIVO) 116 mg in sodium chloride 0.9 % 100 mL chemo infusion, 1 mg/kg = 116 mg, Intravenous, Once, 17 of 18 cycles Dose modification: 480 mg (original dose 480 mg, Cycle 5, Reason: Other (see comments)) Administration: 116 mg (12/08/2017), 480 mg (03/10/2018), 116 mg (12/29/2017), 116 mg (01/20/2018), 100 mg (02/15/2018), 480 mg (04/07/2018), 480 mg (05/05/2018), 480 mg (06/02/2018), 480 mg (06/30/2018), 480 mg (07/28/2018), 480 mg (08/25/2018), 480 mg (09/22/2018), 480 mg (10/20/2018), 480 mg (11/17/2018), 480 mg (12/18/2018), 480 mg (01/25/2019)  for chemotherapy treatment.        INTERVAL HISTORY:  Christopher Baird 73 y.o. male presents today for follow-up.  He reports overall doing fair.  He is currently on Opdivo tolerating well.  His main concern is his wife who has dementia.  He is the sole caregiver for her.  He states her dementia is getting worse.  He has talked to our Education officer, museum here.  He does not wish to put his wife in a skilled nursing facility due to risk of her contracting COVID-19.  Clinical wise he is doing well.  Denies any significant fatigue.  Denies any new bony pains.  No change in bowel habits.  Appetite is stable.  No weight loss.  He states he is ready to proceed with treatment  today.   REVIEW OF SYSTEMS:  Review of Systems  Constitutional: Positive for fatigue.  HENT:  Negative.   Eyes: Negative.   Respiratory: Negative.   Cardiovascular: Negative.   Gastrointestinal: Negative.   Endocrine: Negative.   Genitourinary: Negative.    Musculoskeletal: Positive for arthralgias and back pain.  Skin: Negative.   Neurological: Negative.   Hematological: Negative.   Psychiatric/Behavioral: Negative.      PAST MEDICAL/SURGICAL HISTORY:  Past Medical History:  Diagnosis Date   Arthritis, degenerative    Bone metastases ([place]) 12/29/2017   Calculus of kidney    Gout, unspecified    Skin cancer    Unspecified essential hypertension    Past Surgical History:  Procedure Laterality Date   None       SOCIAL HISTORY:  Social History   Socioeconomic History   Marital status: Married    Spouse name: Not on file   Number of children: 1   Years of education: Not on file   Highest education level: Not on file  Occupational History   Occupation: Retired    Comment: Chief Financial Officer  Tobacco Use   Smoking status: Never Smoker   Smokeless tobacco: Never Used  Substance and Sexual Activity   Alcohol use: No   Drug use: No   Sexual activity: Not on file  Other Topics Concern   Not on file  Social History Narrative   Not on file   Social Determinants of Radio broadcast assistant  Strain: Low Risk    Difficulty of Paying Living Expenses: Not hard at all  Food Insecurity: No Food Insecurity   Worried About Charity fundraiser in the Last Year: Never true   Ran Out of Food in the Last Year: Never true  Transportation Needs: No Transportation Needs   Lack of Transportation (Medical): No   Lack of Transportation (Non-Medical): No  Physical Activity: Inactive   Days of Exercise per Week: 0 days   Minutes of Exercise per Session: 0 min  Stress: Stress Concern Present   Feeling of Stress : To some extent  Social  Connections: Slightly Isolated   Frequency of Communication with Friends and Family: More than three times a week   Frequency of Social Gatherings with Friends and Family: Once a week   Attends Religious Services: 1 to 4 times per year   Active Member of [employer] or Organizations: No   Attends Music therapist: Never   Marital Status: Married  Human resources officer Violence: Not At Risk   Fear of Current or Ex-Partner: No   Emotionally Abused: No   Physically Abused: No   Sexually Abused: No    FAMILY HISTORY:  Family History  Problem Relation Age of Onset   Heart disease Mother    Colon cancer Mother    Stroke Mother    Alcohol abuse Father        Cause of death is Suicide   Breast cancer Sister    Mental illness Paternal Aunt    Alcohol abuse Paternal Uncle     CURRENT MEDICATIONS:  Outpatient Encounter Medications as of 02/22/2019  Medication Sig   allopurinol (ZYLOPRIM) 300 MG tablet Take 300 mg by mouth daily.   diltiazem (CARDIZEM CD) 240 MG 24 hr capsule TAKE 1 CAPSULE (240 MG TOTAL) BY MOUTH EVERY DAY   flurazepam (DALMANE) 30 MG capsule Take 30 mg by mouth at bedtime.   gabapentin (NEURONTIN) 300 MG capsule Take 300 mg by mouth 3 (three) times daily.   Ipilimumab (YERVOY IV) Inject into the vein every 21 ( twenty-one) days.    KLOR-CON M20 20 MEQ tablet TAKE 1 TABLET BY MOUTH EVERY DAY   lisinopril (PRINIVIL,ZESTRIL) 40 MG tablet Take 40 mg by mouth daily.    magnesium oxide (MAG-OX) 400 MG tablet TAKE 1 TABLET BY MOUTH TWICE A DAY   metFORMIN (GLUCOPHAGE) 500 MG tablet Take 500 mg by mouth daily.   metoprolol tartrate (LOPRESSOR) 50 MG tablet Take 50 mg by mouth 2 (two) times daily.    Multiple Vitamin (MULTIVITAMIN) tablet Take 1 tablet by mouth daily.   Nivolumab (OPDIVO IV) Inject into the vein every 21 ( twenty-one) days.    rivaroxaban (XARELTO) 20 MG TABS tablet Take 1 tablet (20 mg total) by mouth daily with supper.    [DISCONTINUED] KLOR-CON M20 20 MEQ tablet TAKE 1 TABLET BY MOUTH EVERY DAY   [DISCONTINUED] KLOR-CON M20 20 MEQ tablet TAKE 1 TABLET BY MOUTH EVERY DAY   No facility-administered encounter medications on file as of 02/22/2019.    ALLERGIES:  No Known Allergies   PHYSICAL EXAM:  ECOG Performance status: 1  Vitals:   02/22/19 0905  BP: 121/67  Pulse: (!) 51  Resp: 18  Temp: (!) 96.9 F (36.1 C)  SpO2: 98%   Filed Weights   02/22/19 0905  Weight: 247 lb 3.2 oz (112.1 kg)    Physical Exam Constitutional:      Appearance: Normal appearance.  HENT:  Head: Normocephalic.     Right Ear: External ear normal.     Left Ear: External ear normal.     Mouth/Throat:     Pharynx: Oropharynx is clear.  Eyes:     Conjunctiva/sclera: Conjunctivae normal.  Cardiovascular:     Rate and Rhythm: Normal rate and regular rhythm.     Pulses: Normal pulses.     Heart sounds: Normal heart sounds.  Pulmonary:     Effort: Pulmonary effort is normal.     Breath sounds: Normal breath sounds.  Abdominal:     General: Bowel sounds are normal.  Musculoskeletal:        General: Normal range of motion.     Cervical back: Normal range of motion.  Skin:    General: Skin is warm.  Neurological:     General: No focal deficit present.     Mental Status: He is alert and oriented to person, place, and time.  Psychiatric:        Mood and Affect: Mood normal.        Behavior: Behavior normal.      LABORATORY DATA:  I have reviewed the labs as listed.  CBC    Component Value Date/Time   WBC 5.5 02/22/2019 0826   RBC 4.52 02/22/2019 0826   HGB 13.9 02/22/2019 0826   HCT 44.0 02/22/2019 0826   PLT 164 02/22/2019 0826   MCV 97.3 02/22/2019 0826   MCH 30.8 02/22/2019 0826   MCHC 31.6 02/22/2019 0826   RDW 15.7 (H) 02/22/2019 0826   LYMPHSABS 1.1 02/22/2019 0826   MONOABS 0.6 02/22/2019 0826   EOSABS 0.1 02/22/2019 0826   BASOSABS 0.0 02/22/2019 0826   CMP Latest Ref Rng & Units  02/22/2019 01/25/2019 12/18/2018  Glucose 70 - 99 mg/dL 128(H) 123(H) 113(H)  BUN 8 - 23 mg/dL 16 20 15   Creatinine 0.61 - 1.24 mg/dL 1.20 1.24 1.29(H)  Sodium 135 - 145 mmol/L 140 139 140  Potassium 3.5 - 5.1 mmol/L 4.3 5.0 4.7  Chloride 98 - 111 mmol/L 106 106 105  CO2 22 - 32 mmol/L 23 24 26   Calcium 8.9 - 10.3 mg/dL 9.0 9.0 9.5  Total Protein 6.5 - 8.1 g/dL 7.4 7.0 7.4  Total Bilirubin 0.3 - 1.2 mg/dL 0.8 1.0 1.2  Alkaline Phos 38 - 126 U/L 66 65 67  AST 15 - 41 U/L 30 22 27   ALT 0 - 44 U/L 25 15 19        ASSESSMENT & PLAN:   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 -Opdivo every 4 weeks started on 03/10/2018. -He is tolerating immunotherapy very well without any major side effects. - PET scan from 11/13/2018 shows right upper lobe lung nodule measuring 1.5 cm, SUV 9.5.  New mass in the right adrenal gland measuring 1.9 cm with SUV of 9.2.  Nodule in the right anterior abdominal wall below the rib cage measures 1.9 x 1.5 cm, increased in size by 3 mm compared to prior scan.  Sclerotic bone metastases are stable with no hypermetabolic activity. - He was evaluated by Dr. Theadore Nan in [place].  He is having fiducials in the lung placed on 12/20/2018.  This will be followed by CyberKnife therapy to all 3 areas.  He completed CyberKnife therapy on 01/21/2019 -I have reviewed his blood work which is more or less stable. -He  will proceed with treatment today.  Return to clinic in 4 weeks.  2.  Atrial fibrillation: -He will continue Xarelto.  No bleeding episodes reported.  3.  Bone metastasis: - He will continue denosumab.  He is not having any problems.  He will continue calcium and vitamin D.         [place] [phone]

## 2019-03-06 ENCOUNTER — Other Ambulatory Visit: Payer: Self-pay | Admitting: Cardiology

## 2019-03-17 ENCOUNTER — Other Ambulatory Visit (HOSPITAL_COMMUNITY): Payer: Self-pay | Admitting: Nurse Practitioner

## 2019-03-17 DIAGNOSIS — C7801 Secondary malignant neoplasm of right lung: Secondary | ICD-10-CM

## 2019-03-18 ENCOUNTER — Other Ambulatory Visit (HOSPITAL_COMMUNITY): Payer: Self-pay | Admitting: Hematology

## 2019-03-18 DIAGNOSIS — C7801 Secondary malignant neoplasm of right lung: Secondary | ICD-10-CM

## 2019-03-22 ENCOUNTER — Encounter (HOSPITAL_COMMUNITY): Payer: Self-pay

## 2019-03-22 ENCOUNTER — Other Ambulatory Visit (HOSPITAL_COMMUNITY): Payer: Medicare Other

## 2019-03-22 ENCOUNTER — Inpatient Hospital Stay (HOSPITAL_COMMUNITY): Payer: Medicare Other | Attending: Hematology

## 2019-03-22 ENCOUNTER — Other Ambulatory Visit: Payer: Self-pay

## 2019-03-22 ENCOUNTER — Ambulatory Visit (HOSPITAL_COMMUNITY): Payer: Medicare Other

## 2019-03-22 ENCOUNTER — Inpatient Hospital Stay (HOSPITAL_COMMUNITY): Payer: Medicare Other

## 2019-03-22 VITALS — BP 124/85 | HR 75 | Temp 97.5°F | Resp 20 | Wt 247.0 lb

## 2019-03-22 DIAGNOSIS — I4891 Unspecified atrial fibrillation: Secondary | ICD-10-CM | POA: Insufficient documentation

## 2019-03-22 DIAGNOSIS — Z8249 Family history of ischemic heart disease and other diseases of the circulatory system: Secondary | ICD-10-CM | POA: Insufficient documentation

## 2019-03-22 DIAGNOSIS — Z803 Family history of malignant neoplasm of breast: Secondary | ICD-10-CM | POA: Diagnosis not present

## 2019-03-22 DIAGNOSIS — C3411 Malignant neoplasm of upper lobe, right bronchus or lung: Secondary | ICD-10-CM | POA: Insufficient documentation

## 2019-03-22 DIAGNOSIS — R5383 Other fatigue: Secondary | ICD-10-CM | POA: Diagnosis not present

## 2019-03-22 DIAGNOSIS — M549 Dorsalgia, unspecified: Secondary | ICD-10-CM | POA: Diagnosis not present

## 2019-03-22 DIAGNOSIS — Z818 Family history of other mental and behavioral disorders: Secondary | ICD-10-CM | POA: Insufficient documentation

## 2019-03-22 DIAGNOSIS — Z823 Family history of stroke: Secondary | ICD-10-CM | POA: Insufficient documentation

## 2019-03-22 DIAGNOSIS — Z79899 Other long term (current) drug therapy: Secondary | ICD-10-CM | POA: Diagnosis not present

## 2019-03-22 DIAGNOSIS — C7801 Secondary malignant neoplasm of right lung: Secondary | ICD-10-CM

## 2019-03-22 DIAGNOSIS — C7951 Secondary malignant neoplasm of bone: Secondary | ICD-10-CM | POA: Insufficient documentation

## 2019-03-22 DIAGNOSIS — M2550 Pain in unspecified joint: Secondary | ICD-10-CM | POA: Insufficient documentation

## 2019-03-22 DIAGNOSIS — Z800 Family history of malignant neoplasm of digestive organs: Secondary | ICD-10-CM | POA: Diagnosis not present

## 2019-03-22 DIAGNOSIS — Z811 Family history of alcohol abuse and dependence: Secondary | ICD-10-CM | POA: Diagnosis not present

## 2019-03-22 LAB — COMPREHENSIVE METABOLIC PANEL
ALT: 18 U/L (ref 0–44)
AST: 26 U/L (ref 15–41)
Albumin: 3.7 g/dL (ref 3.5–5.0)
Alkaline Phosphatase: 63 U/L (ref 38–126)
Anion gap: 9 (ref 5–15)
BUN: 19 mg/dL (ref 8–23)
CO2: 27 mmol/L (ref 22–32)
Calcium: 9.4 mg/dL (ref 8.9–10.3)
Chloride: 105 mmol/L (ref 98–111)
Creatinine, Ser: 1.26 mg/dL — ABNORMAL HIGH (ref 0.61–1.24)
GFR calc Af Amer: >60 mL/min (ref >60)
GFR calc non Af Amer: 55 mL/min — ABNORMAL LOW (ref >60)
Glucose, Bld: 100 mg/dL — ABNORMAL HIGH (ref 70–99)
Potassium: 4.4 mmol/L (ref 3.5–5.1)
Sodium: 141 mmol/L (ref 135–145)
Total Bilirubin: 1.1 mg/dL (ref 0.3–1.2)
Total Protein: 7 g/dL (ref 6.5–8.1)

## 2019-03-22 LAB — CBC WITH DIFFERENTIAL/PLATELET
Abs Immature Granulocytes: 0.01 10*3/uL (ref 0.00–0.07)
Basophils Absolute: 0 10*3/uL (ref 0.0–0.1)
Basophils Relative: 1 %
Eosinophils Absolute: 0.1 10*3/uL (ref 0.0–0.5)
Eosinophils Relative: 1 %
HCT: 43.9 % (ref 39.0–52.0)
Hemoglobin: 14.3 g/dL (ref 13.0–17.0)
Immature Granulocytes: 0 %
Lymphocytes Relative: 19 %
Lymphs Abs: 1.1 10*3/uL (ref 0.7–4.0)
MCH: 31.6 pg (ref 26.0–34.0)
MCHC: 32.6 g/dL (ref 30.0–36.0)
MCV: 97.1 fL (ref 80.0–100.0)
Monocytes Absolute: 0.8 10*3/uL (ref 0.1–1.0)
Monocytes Relative: 13 %
Neutro Abs: 3.9 10*3/uL (ref 1.7–7.7)
Neutrophils Relative %: 66 %
Platelets: 174 10*3/uL (ref 150–400)
RBC: 4.52 MIL/uL (ref 4.22–5.81)
RDW: 15.6 % — ABNORMAL HIGH (ref 11.5–15.5)
WBC: 5.8 10*3/uL (ref 4.0–10.5)
nRBC: 0 % (ref 0.0–0.2)

## 2019-03-22 LAB — TSH: TSH: 1.231 u[IU]/mL (ref 0.350–4.500)

## 2019-03-22 MED ORDER — SODIUM CHLORIDE 0.9 % IV SOLN
480.0000 mg | Freq: Once | INTRAVENOUS | Status: AC
  Administered 2019-03-22: 480 mg via INTRAVENOUS
  Filled 2019-03-22: qty 48

## 2019-03-22 MED ORDER — SODIUM CHLORIDE 0.9 % IV SOLN: Freq: Once | INTRAVENOUS | Status: AC

## 2019-03-22 MED ORDER — DENOSUMAB 120 MG/1.7ML ~~LOC~~ SOLN
120.0000 mg | Freq: Once | SUBCUTANEOUS | Status: AC
  Administered 2019-03-22: 120 mg via SUBCUTANEOUS
  Filled 2019-03-22: qty 1.7

## 2019-03-22 NOTE — Patient Instructions (Signed)
[facility] Discharge Instructions for Patients Receiving Chemotherapy  Today you received the following chemotherapy agents   To help prevent nausea and vomiting after your treatment, we encourage you to take your nausea medication   If you develop nausea and vomiting that is not controlled by your nausea medication, call the clinic.   BELOW ARE SYMPTOMS THAT SHOULD BE REPORTED IMMEDIATELY:  *FEVER GREATER THAN 100.5 F  *CHILLS WITH OR WITHOUT FEVER  NAUSEA AND VOMITING THAT IS NOT CONTROLLED WITH YOUR NAUSEA MEDICATION  *UNUSUAL SHORTNESS OF BREATH  *UNUSUAL BRUISING OR BLEEDING  TENDERNESS IN MOUTH AND THROAT WITH OR WITHOUT PRESENCE OF ULCERS  *URINARY PROBLEMS  *BOWEL PROBLEMS  UNUSUAL RASH Items with * indicate a potential emergency and should be followed up as soon as possible.  Feel free to call the clinic should you have any questions or concerns. The clinic phone number is [phone].  Please show the CHEMO ALERT CARD at check-in to the Emergency Department and triage nurse.   

## 2019-03-22 NOTE — Progress Notes (Signed)
Treatment given today per MD orders. Tolerated infusion without adverse affects. Vital signs stable. No complaints at this time. Discharged from clinic ambulatory. F/U with [facility] as scheduled.   

## 2019-03-29 ENCOUNTER — Other Ambulatory Visit: Payer: Self-pay | Admitting: Cardiology

## 2019-04-12 ENCOUNTER — Other Ambulatory Visit (HOSPITAL_COMMUNITY): Payer: Self-pay | Admitting: Nurse Practitioner

## 2019-04-12 DIAGNOSIS — C7801 Secondary malignant neoplasm of right lung: Secondary | ICD-10-CM

## 2019-04-19 ENCOUNTER — Inpatient Hospital Stay (HOSPITAL_COMMUNITY): Payer: Medicare Other

## 2019-04-19 ENCOUNTER — Encounter (HOSPITAL_COMMUNITY): Payer: Self-pay

## 2019-04-19 ENCOUNTER — Ambulatory Visit (HOSPITAL_COMMUNITY): Payer: Medicare Other

## 2019-04-19 ENCOUNTER — Other Ambulatory Visit: Payer: Self-pay

## 2019-04-19 ENCOUNTER — Inpatient Hospital Stay (HOSPITAL_BASED_OUTPATIENT_CLINIC_OR_DEPARTMENT_OTHER): Payer: Medicare Other | Admitting: Hematology

## 2019-04-19 ENCOUNTER — Other Ambulatory Visit (HOSPITAL_COMMUNITY): Payer: Medicare Other

## 2019-04-19 ENCOUNTER — Inpatient Hospital Stay (HOSPITAL_COMMUNITY): Payer: Medicare Other | Attending: Hematology

## 2019-04-19 ENCOUNTER — Encounter (HOSPITAL_COMMUNITY): Payer: Self-pay | Admitting: Hematology

## 2019-04-19 VITALS — BP 128/72 | HR 83 | Temp 97.3°F | Resp 18 | Wt 243.0 lb

## 2019-04-19 DIAGNOSIS — S90922A Unspecified superficial injury of left foot, initial encounter: Secondary | ICD-10-CM | POA: Insufficient documentation

## 2019-04-19 DIAGNOSIS — Z800 Family history of malignant neoplasm of digestive organs: Secondary | ICD-10-CM | POA: Diagnosis not present

## 2019-04-19 DIAGNOSIS — Z7901 Long term (current) use of anticoagulants: Secondary | ICD-10-CM | POA: Insufficient documentation

## 2019-04-19 DIAGNOSIS — C7801 Secondary malignant neoplasm of right lung: Secondary | ICD-10-CM | POA: Diagnosis not present

## 2019-04-19 DIAGNOSIS — R200 Anesthesia of skin: Secondary | ICD-10-CM | POA: Insufficient documentation

## 2019-04-19 DIAGNOSIS — Z803 Family history of malignant neoplasm of breast: Secondary | ICD-10-CM | POA: Insufficient documentation

## 2019-04-19 DIAGNOSIS — C7951 Secondary malignant neoplasm of bone: Secondary | ICD-10-CM | POA: Insufficient documentation

## 2019-04-19 DIAGNOSIS — Z85828 Personal history of other malignant neoplasm of skin: Secondary | ICD-10-CM | POA: Insufficient documentation

## 2019-04-19 DIAGNOSIS — M1990 Unspecified osteoarthritis, unspecified site: Secondary | ICD-10-CM | POA: Insufficient documentation

## 2019-04-19 DIAGNOSIS — Z8249 Family history of ischemic heart disease and other diseases of the circulatory system: Secondary | ICD-10-CM | POA: Insufficient documentation

## 2019-04-19 DIAGNOSIS — Z823 Family history of stroke: Secondary | ICD-10-CM | POA: Diagnosis not present

## 2019-04-19 DIAGNOSIS — E875 Hyperkalemia: Secondary | ICD-10-CM

## 2019-04-19 DIAGNOSIS — Z79899 Other long term (current) drug therapy: Secondary | ICD-10-CM | POA: Diagnosis not present

## 2019-04-19 DIAGNOSIS — R197 Diarrhea, unspecified: Secondary | ICD-10-CM | POA: Diagnosis not present

## 2019-04-19 DIAGNOSIS — I10 Essential (primary) hypertension: Secondary | ICD-10-CM | POA: Diagnosis not present

## 2019-04-19 DIAGNOSIS — Z811 Family history of alcohol abuse and dependence: Secondary | ICD-10-CM | POA: Diagnosis not present

## 2019-04-19 DIAGNOSIS — X58XXXA Exposure to other specified factors, initial encounter: Secondary | ICD-10-CM | POA: Insufficient documentation

## 2019-04-19 DIAGNOSIS — Z87442 Personal history of urinary calculi: Secondary | ICD-10-CM | POA: Diagnosis not present

## 2019-04-19 DIAGNOSIS — C3411 Malignant neoplasm of upper lobe, right bronchus or lung: Secondary | ICD-10-CM | POA: Insufficient documentation

## 2019-04-19 DIAGNOSIS — Z818 Family history of other mental and behavioral disorders: Secondary | ICD-10-CM | POA: Insufficient documentation

## 2019-04-19 DIAGNOSIS — R0602 Shortness of breath: Secondary | ICD-10-CM | POA: Diagnosis not present

## 2019-04-19 DIAGNOSIS — I4891 Unspecified atrial fibrillation: Secondary | ICD-10-CM | POA: Insufficient documentation

## 2019-04-19 LAB — CBC WITH DIFFERENTIAL/PLATELET
Abs Immature Granulocytes: 0.02 10*3/uL (ref 0.00–0.07)
Basophils Absolute: 0 10*3/uL (ref 0.0–0.1)
Basophils Relative: 1 %
Eosinophils Absolute: 0.1 10*3/uL (ref 0.0–0.5)
Eosinophils Relative: 2 %
HCT: 40.4 % (ref 39.0–52.0)
Hemoglobin: 12.8 g/dL — ABNORMAL LOW (ref 13.0–17.0)
Immature Granulocytes: 0 %
Lymphocytes Relative: 20 %
Lymphs Abs: 1.1 10*3/uL (ref 0.7–4.0)
MCH: 31.6 pg (ref 26.0–34.0)
MCHC: 31.7 g/dL (ref 30.0–36.0)
MCV: 99.8 fL (ref 80.0–100.0)
Monocytes Absolute: 0.5 10*3/uL (ref 0.1–1.0)
Monocytes Relative: 9 %
Neutro Abs: 3.9 10*3/uL (ref 1.7–7.7)
Neutrophils Relative %: 68 %
Platelets: 262 10*3/uL (ref 150–400)
RBC: 4.05 MIL/uL — ABNORMAL LOW (ref 4.22–5.81)
RDW: 15.5 % (ref 11.5–15.5)
WBC: 5.6 10*3/uL (ref 4.0–10.5)
nRBC: 0 % (ref 0.0–0.2)

## 2019-04-19 LAB — COMPREHENSIVE METABOLIC PANEL
ALT: 22 U/L (ref 0–44)
AST: 29 U/L (ref 15–41)
Albumin: 3.5 g/dL (ref 3.5–5.0)
Alkaline Phosphatase: 66 U/L (ref 38–126)
Anion gap: 7 (ref 5–15)
BUN: 18 mg/dL (ref 8–23)
CO2: 23 mmol/L (ref 22–32)
Calcium: 8.8 mg/dL — ABNORMAL LOW (ref 8.9–10.3)
Chloride: 109 mmol/L (ref 98–111)
Creatinine, Ser: 1.54 mg/dL — ABNORMAL HIGH (ref 0.61–1.24)
GFR calc Af Amer: 50 mL/min — ABNORMAL LOW (ref >60)
GFR calc non Af Amer: 43 mL/min — ABNORMAL LOW (ref >60)
Glucose, Bld: 94 mg/dL (ref 70–99)
Potassium: 5.4 mmol/L — ABNORMAL HIGH (ref 3.5–5.1)
Sodium: 139 mmol/L (ref 135–145)
Total Bilirubin: 0.5 mg/dL (ref 0.3–1.2)
Total Protein: 6.7 g/dL (ref 6.5–8.1)

## 2019-04-19 LAB — TSH: TSH: 1.064 u[IU]/mL (ref 0.350–4.500)

## 2019-04-19 MED ORDER — SODIUM POLYSTYRENE SULFONATE 15 GM/60ML PO SUSP
30.0000 g | Freq: Once | ORAL | Status: AC
  Administered 2019-04-19: 30 g via ORAL
  Filled 2019-04-19: qty 120

## 2019-04-19 MED ORDER — DENOSUMAB 120 MG/1.7ML ~~LOC~~ SOLN
120.0000 mg | Freq: Once | SUBCUTANEOUS | Status: AC
  Administered 2019-04-19: 16:00:00 120 mg via SUBCUTANEOUS

## 2019-04-19 MED ORDER — DENOSUMAB 120 MG/1.7ML ~~LOC~~ SOLN
SUBCUTANEOUS | Status: AC
  Filled 2019-04-19: qty 1.7

## 2019-04-19 MED ORDER — SODIUM CHLORIDE 0.9% IV SOLUTION: Freq: Once | INTRAVENOUS | Status: AC

## 2019-04-19 NOTE — Patient Instructions (Addendum)
[place] at [facility] Discharge Instructions  You were seen today by Dr. Delton Coombes. He went over your recent lab results. He will hold your treatment today. He will see you back in 4 weeks for labs and follow up.   Thank you for choosing [place] at [facility] to provide your oncology and hematology care.  To afford each patient quality time with our provider, please arrive at least 15 minutes before your scheduled appointment time.   If you have a lab appointment with the [facility] please come in thru the  Main Entrance and check in at the main information desk  You need to re-schedule your appointment should you arrive 10 or more minutes late.  We strive to give you quality time with our providers, and arriving late affects you and other patients whose appointments are after yours.  Also, if you no show three or more times for appointments you may be dismissed from the clinic at the providers discretion.     Again, thank you for choosing [facility].  Our hope is that these requests will decrease the amount of time that you wait before being seen by our physicians.       _____________________________________________________________  Should you have questions after your visit to [facility], please contact our office at [phone] between the hours of 8:00 a.m. and 4:30 p.m.  Voicemails left after 4:00 p.m. will not be returned until the following business day.  For prescription refill requests, have your pharmacy contact our office and allow 72 hours.    Cancer Center Support Programs:   > Cancer Support Group  2nd Tuesday of the month 1pm-2pm, Journey Room

## 2019-04-19 NOTE — Assessment & Plan Note (Signed)
1.  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 -Opdivo every 4 weeks started on 03/10/2018. - PET scan from 11/13/2018 shows right upper lobe lung nodule measuring 1.5 cm, SUV 9.5.  New mass in the right adrenal gland measuring 1.9 cm with SUV of 9.2.  Nodule in the right anterior abdominal wall below the rib cage measures 1.9 x 1.5 cm, increased in size by 3 mm compared to prior scan.  Sclerotic bone metastases are stable with no hypermetabolic activity. -CyberKnife radiosurgery to the lung lesion and adrenal lesion completed on 01/21/2019 under the direction of Dr. Buck in [place]. -He is tolerating Opdivo without any major problems.  He is more concerned about his wife's dementia which is getting worse.  Plans were being made to transfer her to a nursing home. -We reviewed his labs today.  His creatinine has gone up to 1.5.  Potassium is 5.4. -I will hold off on Opdivo today.  We will give him hydration with 1 L of normal saline. -We will give him Kayexalate 30 g for hyperkalemia.  I have told him to stop taking potassium tablets.  He is on potassium 20 mEq daily. -He reported occasional diarrhea about 1-2 times per day, soft to watery in consistency.  We will keep a close eye on it. -He was apparently started on Keflex and Bactrim for a wound on his left foot.  I have examined the wound which is about the little toe of the left foot, most likely pressure ulcer from a tight shoe.  I have counseled him to wear loose shoes. -He also has a follow-up appointment with Dr. Hamdy for wound care in [place]. -I will schedule him for a PET CT scan in 4 weeks and see him back at that time.  2.  Atrial fibrillation: -He will continue Xarelto.  No bleeding episodes reported.  3.  Bone metastasis: -He will continue denosumab.  He will continue calcium and vitamin D supplements. 

## 2019-04-19 NOTE — Progress Notes (Signed)
Christopher Baird, [place] [id number]   CLINIC:  Medical Oncology/Hematology  PCP:  Tillman Abide, FNP [place] [id number] [phone]   REASON FOR VISIT:  Follow-up for metastatic melanoma to the right lung  CURRENT THERAPY:Opdivo every 4 weeks.   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, right ([place])  11/10/2017 Initial Diagnosis   Metastatic melanoma to lung, right ([place])   12/08/2017 -  Chemotherapy   The patient had ipilimumab (YERVOY) 350 mg in sodium chloride 0.9 % 150 mL chemo infusion, 3 mg/kg = 350 mg, Intravenous,  Once, 4 of 4 cycles Administration: 350 mg (12/08/2017), 350 mg (12/29/2017), 350 mg (01/20/2018), 350 mg (02/15/2018) nivolumab (OPDIVO) 116 mg in sodium chloride 0.9 % 100 mL chemo infusion, 1 mg/kg = 116 mg, Intravenous, Once, 18 of 20 cycles Dose modification: 480 mg (original dose 480 mg, Cycle 5, Reason: Other (see comments)) Administration: 116 mg (12/08/2017), 480 mg (03/10/2018), 116 mg (12/29/2017), 116 mg (01/20/2018), 100 mg (02/15/2018), 480 mg (04/07/2018), 480 mg (05/05/2018), 480 mg (06/02/2018), 480 mg (06/30/2018), 480 mg (07/28/2018), 480 mg (08/25/2018), 480 mg (09/22/2018), 480 mg (10/20/2018), 480 mg (11/17/2018), 480 mg (12/18/2018), 480 mg (01/25/2019), 480 mg (02/22/2019), 480 mg (03/22/2019)  for chemotherapy treatment.       CANCER STAGING: Cancer Staging No matching staging information was found for the patient.   INTERVAL HISTORY:  Christopher Baird 74 y.o. male seen for follow-up and toxicity assessment prior to immunotherapy.  He reportedly developed ulcer on his left foot and was started on antibiotics.  He is taking Keflex and Bactrim.  He reported occasional diarrhea with 1-2 loose to watery stools per day.  He reports some shortness of breath on exertion.  He reportedly took his wife to [facility] yesterday and had a 2-hour consultation with her doctor.   Her dementia is getting worse and she needs to be transferred to a facility.  He is more worried about it.  Appetite is 50%.  Energy levels are 25%.  Denies any fevers or chills.  Denies any new onset pains.  He reportedly finished CyberKnife treatment in November and has been feeling well.     REVIEW OF SYSTEMS:  Review of Systems  Respiratory: Positive for shortness of breath.   Gastrointestinal: Positive for diarrhea.  Skin: Positive for wound.  Neurological: Positive for numbness.  All other systems reviewed and are negative.    PAST MEDICAL/SURGICAL HISTORY:  Past Medical History:  Diagnosis Date  . Arthritis, degenerative   . Bone metastases ([place]) 12/29/2017  . Calculus of kidney   . Gout, unspecified   . Skin cancer   . Unspecified essential hypertension    Past Surgical History:  Procedure Laterality Date  . None       SOCIAL HISTORY:  Social History   Socioeconomic History  . Marital status: Married    Spouse name: Not on file  . Number of children: 1  . Years of education: Not on file  . Highest education level: Not on file  Occupational History  . Occupation: Retired    Comment: [employer]  Tobacco Use  . Smoking status: Never Smoker  . Smokeless tobacco: Never Used  Substance and Sexual Activity  . Alcohol use: No  . Drug use: No  . Sexual activity: Not on file  Other Topics Concern  . Not on file  Social History Narrative  . Not on  file   Social Determinants of Health   Financial Resource Strain:   . Difficulty of Paying Living Expenses: Not on file  Food Insecurity:   . Worried About Charity fundraiser in the Last Year: Not on file  . Ran Out of Food in the Last Year: Not on file  Transportation Needs:   . Lack of Transportation (Medical): Not on file  . Lack of Transportation (Non-Medical): Not on file  Physical Activity:   . Days of Exercise per Week: Not on file  . Minutes of Exercise per Session: Not on file    Stress:   . Feeling of Stress : Not on file  Social Connections:   . Frequency of Communication with Friends and Family: Not on file  . Frequency of Social Gatherings with Friends and Family: Not on file  . Attends Religious Services: Not on file  . Active Member of Clubs or Organizations: Not on file  . Attends Archivist Meetings: Not on file  . Marital Status: Not on file  Intimate Partner Violence:   . Fear of Current or Ex-Partner: Not on file  . Emotionally Abused: Not on file  . Physically Abused: Not on file  . Sexually Abused: Not on file    FAMILY HISTORY:  Family History  Problem Relation Age of Onset  . Heart disease Mother   . Colon cancer Mother   . Stroke Mother   . Alcohol abuse Father        Cause of death is Suicide  . Breast cancer Sister   . Mental illness Paternal Aunt   . Alcohol abuse Paternal Uncle     CURRENT MEDICATIONS:  Outpatient Encounter Medications as of 04/19/2019  Medication Sig  . allopurinol (ZYLOPRIM) 300 MG tablet Take 300 mg by mouth daily.  . cephALEXin (KEFLEX) 500 MG capsule Take 500 mg by mouth every 6 (six) hours.  Marland Kitchen diltiazem (CARDIZEM CD) 240 MG 24 hr capsule TAKE 1 CAPSULE (240 MG TOTAL) BY MOUTH EVERY DAY  . flurazepam (DALMANE) 30 MG capsule Take 30 mg by mouth at bedtime.  . gabapentin (NEURONTIN) 300 MG capsule Take 300 mg by mouth 3 (three) times daily.  Marland Kitchen Ipilimumab (YERVOY IV) Inject into the vein every 21 ( twenty-one) days.   Marland Kitchen lisinopril (PRINIVIL,ZESTRIL) 40 MG tablet Take 40 mg by mouth daily.   . magnesium oxide (MAG-OX) 400 (241.3 Mg) MG tablet Take 1 tablet by mouth 2 (two) times daily.  . magnesium oxide (MAG-OX) 400 MG tablet TAKE 1 TABLET BY MOUTH TWICE A DAY  . metFORMIN (GLUCOPHAGE) 500 MG tablet Take 500 mg by mouth daily.  . metoprolol tartrate (LOPRESSOR) 50 MG tablet Take 50 mg by mouth 2 (two) times daily.   . Multiple Vitamin (MULTIVITAMIN) tablet Take 1 tablet by mouth daily.  . Nivolumab  (OPDIVO IV) Inject into the vein every 21 ( twenty-one) days.   . Potassium Chloride ER 20 MEQ TBCR TAKE 1 TABLET BY MOUTH EVERY DAY  . sulfamethoxazole-trimethoprim (BACTRIM DS) 800-160 MG tablet SMARTSIG:1 Tablet(s) By Mouth Every 12 Hours  . XARELTO 20 MG TABS tablet TAKE 1 TABLET (20 MG TOTAL) BY MOUTH DAILY WITH SUPPER.  . [DISCONTINUED] Potassium Chloride ER 20 MEQ TBCR Take 1 tablet by mouth daily.  . [DISCONTINUED] KLOR-CON M20 20 MEQ tablet TAKE 1 TABLET BY MOUTH EVERY DAY  . [DISCONTINUED] KLOR-CON M20 20 MEQ tablet TAKE 1 TABLET BY MOUTH EVERY DAY  . [EXPIRED] 0.9 %  sodium chloride infusion (Manually program via Guardrails IV Fluids)   . [EXPIRED] sodium polystyrene (KAYEXALATE) 15 GM/60ML suspension 30 g    No facility-administered encounter medications on file as of 04/19/2019.    ALLERGIES:  No Known Allergies   PHYSICAL EXAM:  ECOG Performance status: 1  There were no vitals filed for this visit. There were no vitals filed for this visit.  Physical Exam Vitals reviewed.  Constitutional:      Appearance: Normal appearance.  Cardiovascular:     Rate and Rhythm: Normal rate and regular rhythm.     Heart sounds: Normal heart sounds.  Pulmonary:     Effort: Pulmonary effort is normal.     Breath sounds: Normal breath sounds.  Abdominal:     General: There is no distension.     Palpations: Abdomen is soft. There is no mass.  Musculoskeletal:        General: No swelling.  Lymphadenopathy:     Cervical: No cervical adenopathy.  Skin:    General: Skin is warm.  Neurological:     General: No focal deficit present.     Mental Status: He is alert and oriented to person, place, and time.  Psychiatric:        Mood and Affect: Mood normal.        Behavior: Behavior normal.      LABORATORY DATA:  I have reviewed the labs as listed.  CBC    Component Value Date/Time   WBC 5.6 04/19/2019 1218   RBC 4.05 (L) 04/19/2019 1218   HGB 12.8 (L) 04/19/2019 1218   HCT  40.4 04/19/2019 1218   PLT 262 04/19/2019 1218   MCV 99.8 04/19/2019 1218   MCH 31.6 04/19/2019 1218   MCHC 31.7 04/19/2019 1218   RDW 15.5 04/19/2019 1218   LYMPHSABS 1.1 04/19/2019 1218   MONOABS 0.5 04/19/2019 1218   EOSABS 0.1 04/19/2019 1218   BASOSABS 0.0 04/19/2019 1218   CMP Latest Ref Rng & Units 04/19/2019 03/22/2019 02/22/2019  Glucose 70 - 99 mg/dL 94 100(H) 128(H)  BUN 8 - 23 mg/dL 18 19 16   Creatinine 0.61 - 1.24 mg/dL 1.54(H) 1.26(H) 1.20  Sodium 135 - 145 mmol/L 139 141 140  Potassium 3.5 - 5.1 mmol/L 5.4(H) 4.4 4.3  Chloride 98 - 111 mmol/L 109 105 106  CO2 22 - 32 mmol/L 23 27 23   Calcium 8.9 - 10.3 mg/dL 8.8(L) 9.4 9.0  Total Protein 6.5 - 8.1 g/dL 6.7 7.0 7.4  Total Bilirubin 0.3 - 1.2 mg/dL 0.5 1.1 0.8  Alkaline Phos 38 - 126 U/L 66 63 66  AST 15 - 41 U/L 29 26 30   ALT 0 - 44 U/L 22 18 25        DIAGNOSTIC IMAGING:  I have independently reviewed the scans and discussed with the patient.   I have reviewed Venita Lick LPN's note and agree with the documentation.  I personally performed a face-to-face visit, made revisions and my assessment and plan is as follows.    ASSESSMENT & PLAN:   Metastatic melanoma to lung, right ([place]) 1.  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 -Opdivo every 4 weeks started on 03/10/2018. - PET scan from 11/13/2018 shows right upper lobe lung nodule measuring 1.5 cm, SUV 9.5.  New mass in the right adrenal gland measuring 1.9 cm with SUV of 9.2.  Nodule in the right anterior abdominal wall  below the rib cage measures 1.9 x 1.5 cm, increased in size by 3 mm compared to prior scan.  Sclerotic bone metastases are stable with no hypermetabolic activity. -CyberKnife radiosurgery to the lung lesion and adrenal lesion completed on 01/21/2019 under the direction of Dr. Theadore Nan in [place]. -He is tolerating Opdivo without any  major problems.  He is more concerned about his wife's dementia which is getting worse.  Plans were being made to transfer her to a nursing home. -We reviewed his labs today.  His creatinine has gone up to 1.5.  Potassium is 5.4. -I will hold off on Opdivo today.  We will give him hydration with 1 L of normal saline. -We will give him Kayexalate 30 g for hyperkalemia.  I have told him to stop taking potassium tablets.  He is on potassium 20 mEq daily. -He reported occasional diarrhea about 1-2 times per day, soft to watery in consistency.  We will keep a close eye on it. -He was apparently started on Keflex and Bactrim for a wound on his left foot.  I have examined the wound which is about the little toe of the left foot, most likely pressure ulcer from a tight shoe.  I have counseled him to wear loose shoes. -He also has a follow-up appointment with Dr. Matt Holmes for wound care in [place]. -I will schedule him for a PET CT scan in 4 weeks and see him back at that time.  2.  Atrial fibrillation: -He will continue Xarelto.  No bleeding episodes reported.  3.  Bone metastasis: -He will continue denosumab.  He will continue calcium and vitamin D supplements.  Total time spent is 30 minutes with more than 70% of the time spent face-to-face discussing withholding treatment, further plan, counseling and coordination of care.    Orders placed this encounter:  Orders Placed This Encounter  Procedures  . NM PET Image Restag (PS) Skull Base To Thigh      Derek Jack, MD [place] [phone]

## 2019-04-19 NOTE — Patient Instructions (Signed)
[facility] at [facility]  Discharge Instructions:   _______________________________________________________________  Thank you for choosing [facility] at [facility] to provide your oncology and hematology care.  To afford each patient quality time with our providers, please arrive at least 15 minutes before your scheduled appointment.  You need to re-schedule your appointment if you arrive 10 or more minutes late.  We strive to give you quality time with our providers, and arriving late affects you and other patients whose appointments are after yours.  Also, if you no show three or more times for appointments you may be dismissed from the clinic.  Again, thank you for choosing [facility] at  Hospital. Our hope is that these requests will allow you access to exceptional care and in a timely manner. _______________________________________________________________  If you have questions after your visit, please contact our office at [phone] between the hours of 8:30 a.m. and 5:00 p.m. Voicemails left after 4:30 p.m. will not be returned until the following business day. _______________________________________________________________  For prescription refill requests, have your pharmacy contact our office. _______________________________________________________________  Recommendations made by the consultant and any test results will be sent to your referring physician. _______________________________________________________________ 

## 2019-04-19 NOTE — Progress Notes (Signed)
Christopher Baird presents today for treatment. MAR reviewed and updated. Christopher Baird has complaints of a wound on his Left foot. Christopher Baird is currently taking Keflex 500 mg q 6 hours for 7 days. Bactrim DS 800-160 mg tablet 1 by mouth every 12 hours.  Christopher Baird states he went to an Urgent [place] [facility] in [place]. Christopher Baird was referred to a [facility].   Labs reviewed with Dr. Delton Coombes and plan of care discussed. Appointment made for Christopher Baird per Dr. Tomie China verbal order. Per MD HOLD treatment today. Infuse 1 L of NS over 2 hours. Send Christopher Baird home with Kayexalate 30 grams to administer once home. Potassium today 5.4. Creatinine 1.54. Calcium 8.8 today. Christopher Baird denies any jaw pain or upcoming dental work. Christopher Baird states he is taking Vitamin D and Calcium as prescribed.   Message received from [facility] LPN/ Dr. Delton Coombes. May give XGeva injection today.   1 Liter of Normal Saline given today per MD orders. Tolerated infusion without adverse affects. Vital signs stable. No complaints at this time. Discharged from clinic ambulatory. F/U with [facility] as scheduled.

## 2019-05-14 ENCOUNTER — Other Ambulatory Visit: Payer: Self-pay

## 2019-05-14 ENCOUNTER — Ambulatory Visit (HOSPITAL_COMMUNITY)
Admission: RE | Admit: 2019-05-14 | Discharge: 2019-05-14 | Disposition: A | Discharge status: Home / Self Care | Payer: Medicare Other | Source: Ambulatory Visit | Attending: Hematology | Admitting: Hematology

## 2019-05-14 DIAGNOSIS — C7801 Secondary malignant neoplasm of right lung: Secondary | ICD-10-CM | POA: Diagnosis present

## 2019-05-14 MED ORDER — FLUDEOXYGLUCOSE F - 18 (FDG) INJECTION
14.7600 | Freq: Once | INTRAVENOUS | Status: AC | PRN
  Administered 2019-05-14: 14.76 via INTRAVENOUS

## 2019-05-17 ENCOUNTER — Inpatient Hospital Stay (HOSPITAL_COMMUNITY): Payer: Medicare Other

## 2019-05-17 ENCOUNTER — Inpatient Hospital Stay (HOSPITAL_BASED_OUTPATIENT_CLINIC_OR_DEPARTMENT_OTHER): Payer: Medicare Other | Admitting: Hematology

## 2019-05-17 ENCOUNTER — Encounter (HOSPITAL_COMMUNITY): Payer: Self-pay | Admitting: Hematology

## 2019-05-17 ENCOUNTER — Other Ambulatory Visit: Payer: Self-pay

## 2019-05-17 ENCOUNTER — Inpatient Hospital Stay (HOSPITAL_COMMUNITY): Payer: Medicare Other | Attending: Hematology

## 2019-05-17 DIAGNOSIS — M1990 Unspecified osteoarthritis, unspecified site: Secondary | ICD-10-CM | POA: Diagnosis not present

## 2019-05-17 DIAGNOSIS — I4891 Unspecified atrial fibrillation: Secondary | ICD-10-CM | POA: Insufficient documentation

## 2019-05-17 DIAGNOSIS — E279 Disorder of adrenal gland, unspecified: Secondary | ICD-10-CM | POA: Insufficient documentation

## 2019-05-17 DIAGNOSIS — Z87442 Personal history of urinary calculi: Secondary | ICD-10-CM | POA: Insufficient documentation

## 2019-05-17 DIAGNOSIS — Z7901 Long term (current) use of anticoagulants: Secondary | ICD-10-CM | POA: Diagnosis not present

## 2019-05-17 DIAGNOSIS — Z8249 Family history of ischemic heart disease and other diseases of the circulatory system: Secondary | ICD-10-CM | POA: Insufficient documentation

## 2019-05-17 DIAGNOSIS — C342 Malignant neoplasm of middle lobe, bronchus or lung: Secondary | ICD-10-CM | POA: Insufficient documentation

## 2019-05-17 DIAGNOSIS — Z800 Family history of malignant neoplasm of digestive organs: Secondary | ICD-10-CM | POA: Insufficient documentation

## 2019-05-17 DIAGNOSIS — C7951 Secondary malignant neoplasm of bone: Secondary | ICD-10-CM

## 2019-05-17 DIAGNOSIS — R200 Anesthesia of skin: Secondary | ICD-10-CM | POA: Diagnosis not present

## 2019-05-17 DIAGNOSIS — C7801 Secondary malignant neoplasm of right lung: Secondary | ICD-10-CM

## 2019-05-17 DIAGNOSIS — Z803 Family history of malignant neoplasm of breast: Secondary | ICD-10-CM | POA: Diagnosis not present

## 2019-05-17 DIAGNOSIS — Z811 Family history of alcohol abuse and dependence: Secondary | ICD-10-CM | POA: Diagnosis not present

## 2019-05-17 DIAGNOSIS — Z79899 Other long term (current) drug therapy: Secondary | ICD-10-CM | POA: Diagnosis not present

## 2019-05-17 DIAGNOSIS — Z823 Family history of stroke: Secondary | ICD-10-CM | POA: Insufficient documentation

## 2019-05-17 DIAGNOSIS — Z5112 Encounter for antineoplastic immunotherapy: Secondary | ICD-10-CM | POA: Insufficient documentation

## 2019-05-17 DIAGNOSIS — Z818 Family history of other mental and behavioral disorders: Secondary | ICD-10-CM | POA: Insufficient documentation

## 2019-05-17 DIAGNOSIS — R0602 Shortness of breath: Secondary | ICD-10-CM | POA: Insufficient documentation

## 2019-05-17 DIAGNOSIS — Z85828 Personal history of other malignant neoplasm of skin: Secondary | ICD-10-CM | POA: Insufficient documentation

## 2019-05-17 DIAGNOSIS — I10 Essential (primary) hypertension: Secondary | ICD-10-CM | POA: Insufficient documentation

## 2019-05-17 LAB — COMPREHENSIVE METABOLIC PANEL
ALT: 13 U/L (ref 0–44)
AST: 19 U/L (ref 15–41)
Albumin: 3.5 g/dL (ref 3.5–5.0)
Alkaline Phosphatase: 62 U/L (ref 38–126)
Anion gap: 10 (ref 5–15)
BUN: 18 mg/dL (ref 8–23)
CO2: 24 mmol/L (ref 22–32)
Calcium: 9.4 mg/dL (ref 8.9–10.3)
Chloride: 105 mmol/L (ref 98–111)
Creatinine, Ser: 1.31 mg/dL — ABNORMAL HIGH (ref 0.61–1.24)
GFR calc Af Amer: >60 mL/min (ref >60)
GFR calc non Af Amer: 53 mL/min — ABNORMAL LOW (ref >60)
Glucose, Bld: 135 mg/dL — ABNORMAL HIGH (ref 70–99)
Potassium: 4.3 mmol/L (ref 3.5–5.1)
Sodium: 139 mmol/L (ref 135–145)
Total Bilirubin: 0.7 mg/dL (ref 0.3–1.2)
Total Protein: 7.1 g/dL (ref 6.5–8.1)

## 2019-05-17 LAB — CBC WITH DIFFERENTIAL/PLATELET
Abs Immature Granulocytes: 0.02 10*3/uL (ref 0.00–0.07)
Basophils Absolute: 0 10*3/uL (ref 0.0–0.1)
Basophils Relative: 1 %
Eosinophils Absolute: 0.1 10*3/uL (ref 0.0–0.5)
Eosinophils Relative: 1 %
HCT: 39.3 % (ref 39.0–52.0)
Hemoglobin: 12.7 g/dL — ABNORMAL LOW (ref 13.0–17.0)
Immature Granulocytes: 0 %
Lymphocytes Relative: 20 %
Lymphs Abs: 1.6 10*3/uL (ref 0.7–4.0)
MCH: 32.6 pg (ref 26.0–34.0)
MCHC: 32.3 g/dL (ref 30.0–36.0)
MCV: 101 fL — ABNORMAL HIGH (ref 80.0–100.0)
Monocytes Absolute: 0.8 10*3/uL (ref 0.1–1.0)
Monocytes Relative: 10 %
Neutro Abs: 5.5 10*3/uL (ref 1.7–7.7)
Neutrophils Relative %: 68 %
Platelets: 243 10*3/uL (ref 150–400)
RBC: 3.89 MIL/uL — ABNORMAL LOW (ref 4.22–5.81)
RDW: 14.8 % (ref 11.5–15.5)
WBC: 8 10*3/uL (ref 4.0–10.5)
nRBC: 0 % (ref 0.0–0.2)

## 2019-05-17 LAB — TSH: TSH: 1.045 u[IU]/mL (ref 0.350–4.500)

## 2019-05-17 MED ORDER — SODIUM CHLORIDE 0.9 % IV SOLN: Freq: Once | INTRAVENOUS | Status: AC

## 2019-05-17 MED ORDER — DENOSUMAB 120 MG/1.7ML ~~LOC~~ SOLN
120.0000 mg | Freq: Once | SUBCUTANEOUS | Status: AC
  Administered 2019-05-17: 120 mg via SUBCUTANEOUS
  Filled 2019-05-17: qty 1.7

## 2019-05-17 MED ORDER — SODIUM CHLORIDE 0.9 % IV SOLN
480.0000 mg | Freq: Once | INTRAVENOUS | Status: AC
  Administered 2019-05-17: 480 mg via INTRAVENOUS
  Filled 2019-05-17: qty 48

## 2019-05-17 NOTE — Progress Notes (Signed)
Message received from [facility] LPN/ Dr. Delton Coombes proceed with treatment. Labs reviewed. Vital signs within parameters for treatment.   Treatment given today per MD orders. Tolerated infusion without adverse affects. Vital signs stable. No complaints at this time. Discharged from clinic ambulatory. F/U with [facility] as scheduled.

## 2019-05-17 NOTE — Patient Instructions (Signed)
[place] at [facility] Discharge Instructions  You were seen today by Dr. Delton Coombes. He went over your recent lab results. He will see you back in 4 weeks for labs, treatment and follow up.   Thank you for choosing [place] at [facility] to provide your oncology and hematology care.  To afford each patient quality time with our provider, please arrive at least 15 minutes before your scheduled appointment time.   If you have a lab appointment with the [facility] please come in thru the  Main Entrance and check in at the main information desk  You need to re-schedule your appointment should you arrive 10 or more minutes late.  We strive to give you quality time with our providers, and arriving late affects you and other patients whose appointments are after yours.  Also, if you no show three or more times for appointments you may be dismissed from the clinic at the providers discretion.     Again, thank you for choosing [facility].  Our hope is that these requests will decrease the amount of time that you wait before being seen by our physicians.       _____________________________________________________________  Should you have questions after your visit to [facility], please contact our office at [phone] between the hours of 8:00 a.m. and 4:30 p.m.  Voicemails left after 4:00 p.m. will not be returned until the following business day.  For prescription refill requests, have your pharmacy contact our office and allow 72 hours.    Cancer Center Support Programs:   > Cancer Support Group  2nd Tuesday of the month 1pm-2pm, Journey Room

## 2019-05-17 NOTE — Patient Instructions (Signed)
[facility] Discharge Instructions for Patients Receiving Chemotherapy  Today you received the following chemotherapy agents   To help prevent nausea and vomiting after your treatment, we encourage you to take your nausea medication   If you develop nausea and vomiting that is not controlled by your nausea medication, call the clinic.   BELOW ARE SYMPTOMS THAT SHOULD BE REPORTED IMMEDIATELY:  *FEVER GREATER THAN 100.5 F  *CHILLS WITH OR WITHOUT FEVER  NAUSEA AND VOMITING THAT IS NOT CONTROLLED WITH YOUR NAUSEA MEDICATION  *UNUSUAL SHORTNESS OF BREATH  *UNUSUAL BRUISING OR BLEEDING  TENDERNESS IN MOUTH AND THROAT WITH OR WITHOUT PRESENCE OF ULCERS  *URINARY PROBLEMS  *BOWEL PROBLEMS  UNUSUAL RASH Items with * indicate a potential emergency and should be followed up as soon as possible.  Feel free to call the clinic should you have any questions or concerns. The clinic phone number is [phone].  Please show the CHEMO ALERT CARD at check-in to the Emergency Department and triage nurse.   

## 2019-05-17 NOTE — Progress Notes (Signed)
Patient has been assessed, vital signs and labs have been reviewed by Dr. Katragadda. ANC, Creatinine, LFTs, and Platelets are within treatment parameters per Dr. Katragadda. The patient is good to proceed with treatment at this time.  

## 2019-05-17 NOTE — Assessment & Plan Note (Signed)
1.  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 -Opdivo every 4 weeks started on 03/10/2018. -CyberKnife radiosurgery to the lung and right adrenal lesion completed on 01/21/2019 with Dr. Zena Amos in [place]. -We reviewed results of the PET CT scan from 05/14/2019.  Fiducial markers in the medial right upper lobe.  No suspicious hypermetabolic lung nodules.  Fiducial markers along the right adrenal gland.  Mild residual adrenal thickening without hypermetabolism.  No focal hypermetabolic activity to suggest bone metastasis.  Scattered osseous lesions with thin sclerotic rim throughout the visualized axial and appendicular skeleton grossly unchanged.  There is a new left adrenal nodule measuring 14 mm, max SUV 5.6. -We have discussed management of this new left adrenal nodule. -Patient is apparently going through a lot of stress at this time as his wife is looking for placement in a nursing home because of advanced dementia.  Hence we have decided to monitor this left adrenal nodule at this time with a repeat scan in 2 months. -His creatinine today has improved to 1.3.  Hence he will proceed with his Opdivo.  Last treatment was on 03/22/2019, held in February due to increased creatinine of 1.5.  I have reviewed his LFTs which are normal.  TSH was also normal. -He will be reevaluated in 4 weeks.  2.  Atrial fibrillation: -She will continue Xarelto.  No bleeding episodes.  3.  Bone metastases: -He will continue denosumab.  He will continue calcium and vitamin D supplements.

## 2019-05-17 NOTE — Progress Notes (Signed)
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, right ([place])  11/10/2017 Initial Diagnosis   Metastatic melanoma to lung, right ([place])   12/08/2017 -  Chemotherapy   The patient had ipilimumab (YERVOY) 350 mg in sodium chloride 0.9 % 150 mL chemo infusion, 3 mg/kg = 350 mg, Intravenous,  Once, 4 of 4 cycles Administration: 350 mg (12/08/2017), 350 mg (12/29/2017), 350 mg (01/20/2018), 350 mg (02/15/2018) nivolumab (OPDIVO) 116 mg in sodium chloride 0.9 % 100 mL chemo infusion, 1 mg/kg = 116 mg, Intravenous, Once, 19 of 20 cycles Dose modification: 480 mg (original dose 480 mg, Cycle 5, Reason: Other (see comments)) Administration: 116 mg (12/08/2017), 480 mg (03/10/2018), 116 mg (12/29/2017), 116 mg (01/20/2018), 100 mg (02/15/2018), 480 mg (04/07/2018), 480 mg (05/05/2018), 480 mg (06/02/2018), 480 mg (06/30/2018), 480 mg (07/28/2018), 480 mg (08/25/2018), 480 mg (09/22/2018), 480 mg (10/20/2018), 480 mg (11/17/2018), 480 mg (12/18/2018), 480 mg (01/25/2019), 480 mg (02/22/2019), 480 mg (03/22/2019), 480 mg (05/17/2019)  for chemotherapy treatment.       CANCER STAGING: Cancer Staging No matching staging information was found for the patient.   INTERVAL HISTORY:  Christopher Baird 74 y.o. male seen for follow-up and toxicity assessment prior to next cycle of chemotherapy.  He had PET CT scan done on first of this month.  Denies any new onset pains.  Reports a lot of stress going on with his wife.  Appetite is 100%.  Energy levels are 25%.  Burning in the feet is stable.  Shortness of breath on exertion is also stable.  Denies any diarrhea, skin rashes are new onset cough.  No recent  infections or hospitalizations.    REVIEW OF SYSTEMS:  Review of Systems  Respiratory: Positive for shortness of breath.   Neurological: Positive for numbness.  All other systems reviewed and are negative.    PAST MEDICAL/SURGICAL HISTORY:  Past Medical History:  Diagnosis Date  . Arthritis, degenerative   . Bone metastases ([place]) 12/29/2017  . Calculus of kidney   . Gout, unspecified   . Skin cancer   . Unspecified essential hypertension    Past Surgical History:  Procedure Laterality Date  . None       SOCIAL HISTORY:  Social History   Socioeconomic History  . Marital status: Married    Spouse name: Not on file  . Number of children: 1  . Years of education: Not on file  . Highest education level: Not on file  Occupational History  . Occupation: Retired    Comment: [employer]  Tobacco Use  . Smoking status: Never Smoker  . Smokeless tobacco: Never Used  Substance and Sexual Activity  . Alcohol use: No  . Drug use: No  . Sexual activity: Not on file  Other Topics Concern  . Not on file  Social History Narrative  . Not on file   Social Determinants of Health   Financial Resource Strain:   . Difficulty of Paying Living Expenses: Not on file  Food Insecurity:   . Worried About Charity fundraiser in the Last Year: Not on file  . Ran Out of Food in the Last Year: Not  on file  Transportation Needs:   . Lack of Transportation (Medical): Not on file  . Lack of Transportation (Non-Medical): Not on file  Physical Activity:   . Days of Exercise per Week: Not on file  . Minutes of Exercise per Session: Not on file  Stress:   . Feeling of Stress : Not on file  Social Connections:   . Frequency of Communication with Friends and Family: Not on file  . Frequency of Social Gatherings with Friends and Family: Not on file  . Attends Religious Services: Not on file  . Active Member of Clubs or Organizations: Not on file  . Attends Theatre manager Meetings: Not on file  . Marital Status: Not on file  Intimate Partner Violence:   . Fear of Current or Ex-Partner: Not on file  . Emotionally Abused: Not on file  . Physically Abused: Not on file  . Sexually Abused: Not on file    FAMILY HISTORY:  Family History  Problem Relation Age of Onset  . Heart disease Mother   . Colon cancer Mother   . Stroke Mother   . Alcohol abuse Father        Cause of death is Suicide  . Breast cancer Sister   . Mental illness Paternal Aunt   . Alcohol abuse Paternal Uncle     CURRENT MEDICATIONS:  Outpatient Encounter Medications as of 05/17/2019  Medication Sig  . allopurinol (ZYLOPRIM) 300 MG tablet Take 300 mg by mouth daily.  . flurazepam (DALMANE) 30 MG capsule Take 30 mg by mouth at bedtime.  . gabapentin (NEURONTIN) 300 MG capsule Take 300 mg by mouth 3 (three) times daily.  Marland Kitchen lisinopril (PRINIVIL,ZESTRIL) 40 MG tablet Take 40 mg by mouth daily.   . magnesium oxide (MAG-OX) 400 (241.3 Mg) MG tablet Take 1 tablet by mouth 2 (two) times daily.  . metFORMIN (GLUCOPHAGE) 500 MG tablet Take 500 mg by mouth daily.  . metoprolol tartrate (LOPRESSOR) 50 MG tablet Take 50 mg by mouth 2 (two) times daily.   . Multiple Vitamin (MULTIVITAMIN) tablet Take 1 tablet by mouth daily.  . Nivolumab (OPDIVO IV) Inject into the vein every 21 ( twenty-one) days.   Christopher Baird 20 MG TABS tablet TAKE 1 TABLET (20 MG TOTAL) BY MOUTH DAILY WITH SUPPER.  . [DISCONTINUED] diltiazem (CARDIZEM CD) 240 MG 24 hr capsule TAKE 1 CAPSULE (240 MG TOTAL) BY MOUTH EVERY DAY  . Potassium Chloride ER 20 MEQ TBCR TAKE 1 TABLET BY MOUTH EVERY DAY (Patient not taking: Reported on 05/17/2019)  . [DISCONTINUED] cephALEXin (KEFLEX) 500 MG capsule Take 500 mg by mouth every 6 (six) hours.  . [DISCONTINUED] Ipilimumab (YERVOY IV) Inject into the vein every 21 ( twenty-one) days.   . [DISCONTINUED] KLOR-CON M20 20 MEQ tablet TAKE 1 TABLET BY MOUTH EVERY DAY  . [DISCONTINUED]  KLOR-CON M20 20 MEQ tablet TAKE 1 TABLET BY MOUTH EVERY DAY  . [DISCONTINUED] magnesium oxide (MAG-OX) 400 MG tablet TAKE 1 TABLET BY MOUTH TWICE A DAY  . [DISCONTINUED] sulfamethoxazole-trimethoprim (BACTRIM DS) 800-160 MG tablet SMARTSIG:1 Tablet(s) By Mouth Every 12 Hours   No facility-administered encounter medications on file as of 05/17/2019.    ALLERGIES:  No Known Allergies   PHYSICAL EXAM:  ECOG Performance status: 1  Vitals:   05/17/19 1100 05/17/19 1416  BP: 131/76 116/63  Pulse: 88 66  Resp: 18 18  Temp: (!) 97.1 F (36.2 C) (!) 97.1 F (36.2 C)  SpO2: 98% 100%  Filed Weights   05/17/19 1100  Weight: 244 lb 6 oz (110.8 kg)    Physical Exam Vitals reviewed.  Constitutional:      Appearance: Normal appearance.  Cardiovascular:     Rate and Rhythm: Normal rate and regular rhythm.     Heart sounds: Normal heart sounds.  Pulmonary:     Effort: Pulmonary effort is normal.     Breath sounds: Normal breath sounds.  Abdominal:     General: There is no distension.     Palpations: Abdomen is soft. There is no mass.  Musculoskeletal:        General: No swelling.  Lymphadenopathy:     Cervical: No cervical adenopathy.  Skin:    General: Skin is warm.  Neurological:     General: No focal deficit present.     Mental Status: He is alert and oriented to person, place, and time.  Psychiatric:        Mood and Affect: Mood normal.        Behavior: Behavior normal.      LABORATORY DATA:  I have reviewed the labs as listed.  CBC    Component Value Date/Time   WBC 8.0 05/17/2019 1101   RBC 3.89 (L) 05/17/2019 1101   HGB 12.7 (L) 05/17/2019 1101   HCT 39.3 05/17/2019 1101   PLT 243 05/17/2019 1101   MCV 101.0 (H) 05/17/2019 1101   MCH 32.6 05/17/2019 1101   MCHC 32.3 05/17/2019 1101   RDW 14.8 05/17/2019 1101   LYMPHSABS 1.6 05/17/2019 1101   MONOABS 0.8 05/17/2019 1101   EOSABS 0.1 05/17/2019 1101   BASOSABS 0.0 05/17/2019 1101   CMP Latest Ref Rng &  Units 05/17/2019 04/19/2019 03/22/2019  Glucose 70 - 99 mg/dL 135(H) 94 100(H)  BUN 8 - 23 mg/dL 18 18 19   Creatinine 0.61 - 1.24 mg/dL 1.31(H) 1.54(H) 1.26(H)  Sodium 135 - 145 mmol/L 139 139 141  Potassium 3.5 - 5.1 mmol/L 4.3 5.4(H) 4.4  Chloride 98 - 111 mmol/L 105 109 105  CO2 22 - 32 mmol/L 24 23 27   Calcium 8.9 - 10.3 mg/dL 9.4 8.8(L) 9.4  Total Protein 6.5 - 8.1 g/dL 7.1 6.7 7.0  Total Bilirubin 0.3 - 1.2 mg/dL 0.7 0.5 1.1  Alkaline Phos 38 - 126 U/L 62 66 63  AST 15 - 41 U/L 19 29 26   ALT 0 - 44 U/L 13 22 18        DIAGNOSTIC IMAGING:  I have independently reviewed scans and discussed with the patient.   I have reviewed Venita Lick LPN's note and agree with the documentation.  I personally performed a face-to-face visit, made revisions and my assessment and plan is as follows.    ASSESSMENT & PLAN:   Metastatic melanoma to lung, right ([place]) 1.  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 -Opdivo every 4 weeks started on 03/10/2018. -CyberKnife radiosurgery to the lung and right adrenal lesion completed on 01/21/2019 with Dr. Zena Amos in [place]. -We reviewed results of the PET CT scan from 05/14/2019.  Fiducial markers in the medial right upper lobe.  No suspicious hypermetabolic lung nodules.  Fiducial markers along the right adrenal gland.  Mild residual adrenal thickening without hypermetabolism.  No focal hypermetabolic activity to suggest bone metastasis.  Scattered osseous lesions with thin sclerotic rim throughout the visualized axial and appendicular skeleton grossly unchanged.  There is a new left adrenal nodule measuring  14 mm, max SUV 5.6. -We have discussed management of this new left adrenal nodule. -Patient is apparently going through a lot of stress at this time as his wife is looking for placement in a nursing home because of advanced dementia.  Hence we have  decided to monitor this left adrenal nodule at this time with a repeat scan in 2 months. -His creatinine today has improved to 1.3.  Hence he will proceed with his Opdivo.  Last treatment was on 03/22/2019, held in February due to increased creatinine of 1.5.  I have reviewed his LFTs which are normal.  TSH was also normal. -He will be reevaluated in 4 weeks.  2.  Atrial fibrillation: -She will continue Xarelto.  No bleeding episodes.  3.  Bone metastases: -He will continue denosumab.  He will continue calcium and vitamin D supplements.      Orders placed this encounter:  No orders of the defined types were placed in this encounter.     Derek Jack, MD [place] [phone]

## 2019-06-13 ENCOUNTER — Other Ambulatory Visit (HOSPITAL_COMMUNITY): Payer: Self-pay | Admitting: Hematology

## 2019-06-13 DIAGNOSIS — C7801 Secondary malignant neoplasm of right lung: Secondary | ICD-10-CM

## 2019-06-14 ENCOUNTER — Ambulatory Visit (HOSPITAL_COMMUNITY): Payer: Medicare Other

## 2019-06-14 ENCOUNTER — Ambulatory Visit (HOSPITAL_COMMUNITY): Payer: Medicare Other | Admitting: Hematology

## 2019-06-14 ENCOUNTER — Inpatient Hospital Stay (HOSPITAL_COMMUNITY): Payer: Medicare Other

## 2019-06-25 NOTE — Progress Notes (Signed)
Pharmacist Chemotherapy Monitoring - Follow Up Assessment    I verify that I have reviewed each item in the below checklist:  . Regimen for the patient is scheduled for the appropriate day and plan matches scheduled date. Marland Kitchen Appropriate non-routine labs are ordered dependent on drug ordered. . If applicable, additional medications reviewed and ordered per protocol based on lifetime cumulative doses and/or treatment regimen.   Plan for follow-up and/or issues identified: No . I-vent associated with next due treatment: No . MD and/or nursing notified: No  Delane Ginger 06/25/2019 11:52 AM

## 2019-06-27 ENCOUNTER — Telehealth (HOSPITAL_COMMUNITY): Payer: Self-pay | Admitting: Hematology

## 2019-06-27 MED ORDER — PLASMA-LYTE A IV SOLN
200.00 | INTRAVENOUS | Status: DC
Start: ? — End: 2019-06-27

## 2019-06-27 MED ORDER — FLUTICASONE PROPIONATE 50 MCG/ACT NA SUSP
2.00 | NASAL | Status: DC
Start: 2019-06-28 — End: 2019-06-27

## 2019-06-27 MED ORDER — LEVETIRACETAM 250 MG PO TABS
250.00 | ORAL_TABLET | ORAL | Status: DC
Start: 2019-06-27 — End: 2019-06-27

## 2019-06-27 MED ORDER — TEMAZEPAM 15 MG PO CAPS
15.00 | ORAL_CAPSULE | ORAL | Status: DC
Start: ? — End: 2019-06-27

## 2019-06-27 MED ORDER — GENERIC EXTERNAL MEDICATION
Status: DC
Start: ? — End: 2019-06-27

## 2019-06-27 MED ORDER — GENERIC EXTERNAL MEDICATION
5.00 | Status: DC
Start: ? — End: 2019-06-27

## 2019-06-27 MED ORDER — ESMOLOL HCL-SODIUM CHLORIDE 2500 MG/250ML IV SOLN
50.00 | INTRAVENOUS | Status: DC
Start: ? — End: 2019-06-27

## 2019-06-27 MED ORDER — GABAPENTIN 100 MG PO CAPS
100.00 | ORAL_CAPSULE | ORAL | Status: DC
Start: 2019-06-27 — End: 2019-06-27

## 2019-06-27 MED ORDER — ALLOPURINOL 300 MG PO TABS
300.00 | ORAL_TABLET | ORAL | Status: DC
Start: 2019-06-28 — End: 2019-06-27

## 2019-06-27 NOTE — Telephone Encounter (Signed)
Stated patient had moved to [facility] and would continue care there.  Requested all appointments here at [facility] be cancelled.  Call was received by Jaynee Eagles.

## 2019-06-28 ENCOUNTER — Ambulatory Visit (HOSPITAL_COMMUNITY): Payer: Medicare Other

## 2019-06-28 ENCOUNTER — Ambulatory Visit (HOSPITAL_COMMUNITY): Payer: Medicare Other | Admitting: Hematology

## 2019-06-28 ENCOUNTER — Inpatient Hospital Stay (HOSPITAL_COMMUNITY): Payer: Medicare Other

## 2019-07-14 DEATH — deceased

## 2020-05-20 IMAGING — PT NM PET TUM IMG RESTAG (PS) SKULL BASE T - THIGH
3 series · 22 of 25 positions shown · non-contrast
Comparison: 07/24/2018

CLINICAL DATA: Subsequent treatment strategy for melanoma.

EXAM:
NUCLEAR MEDICINE PET SKULL VERTEX TO THIGH
TECHNIQUE: 13.9 mCi F-18 FDG was injected intravenously. Full-ring PET imaging
was performed from the skull vertex to thigh after the radiotracer.
CT data was obtained and used for attenuation correction and
anatomic localization.
Fasting blood glucose: 101 mg/dl

[axial ct wb fusion · 14 of 211 slices shown]
[im 1/211]
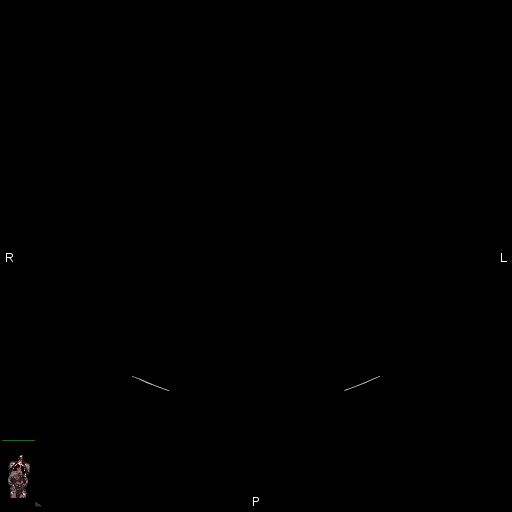
[im 15/211]
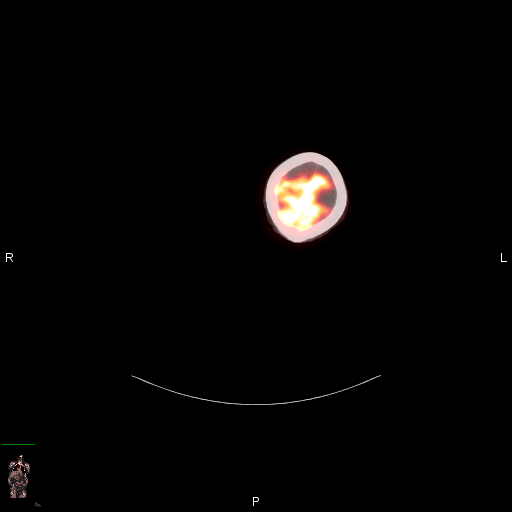
[im 29/211]
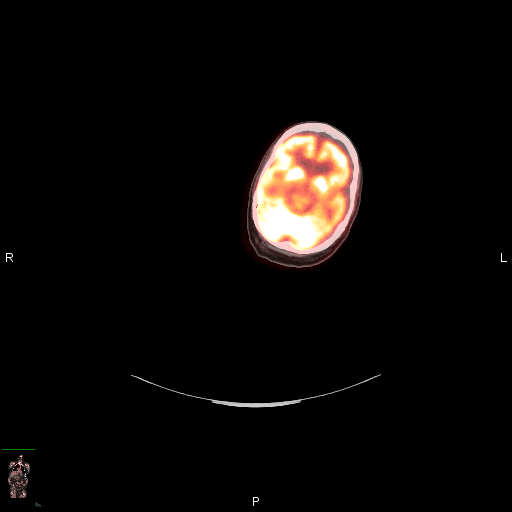
[im 43/211]
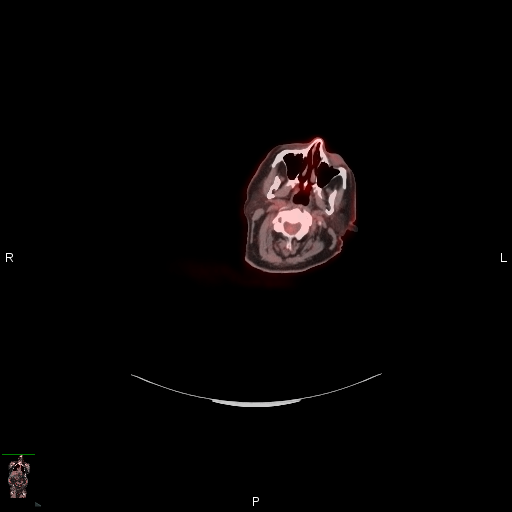
[im 71/211]
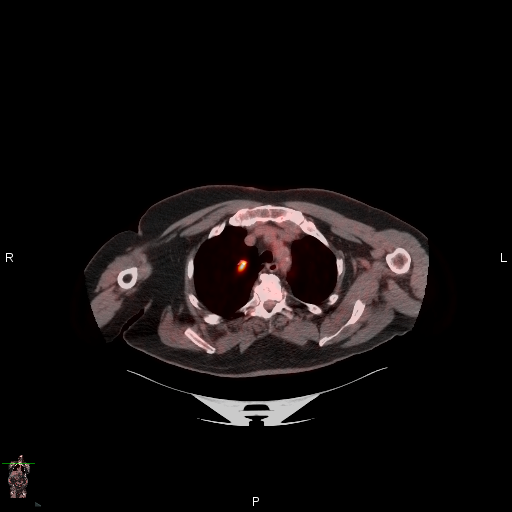
[im 85/211]
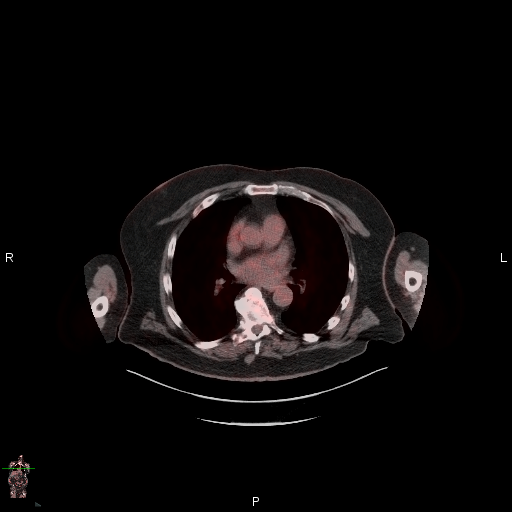
[im 99/211]
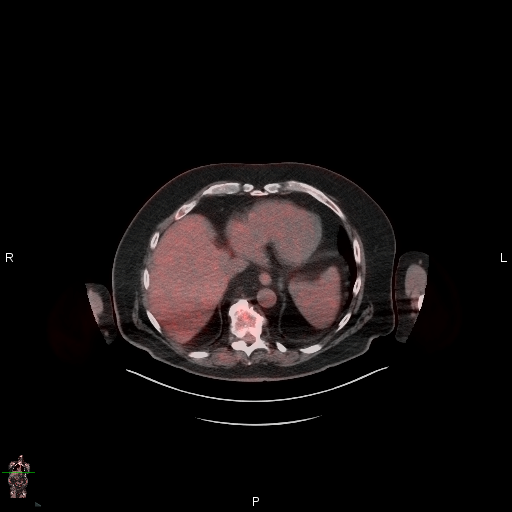
[im 113/211]
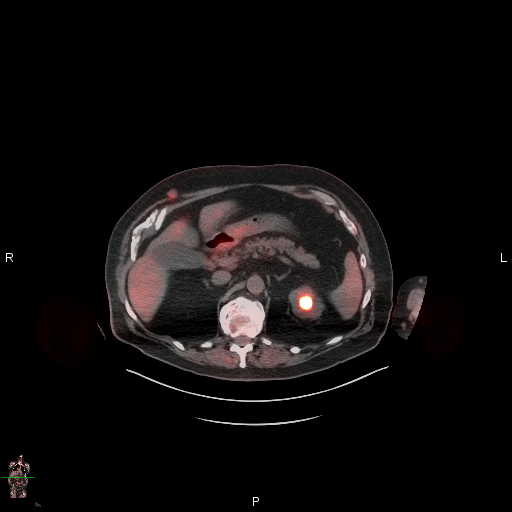
[im 127/211]
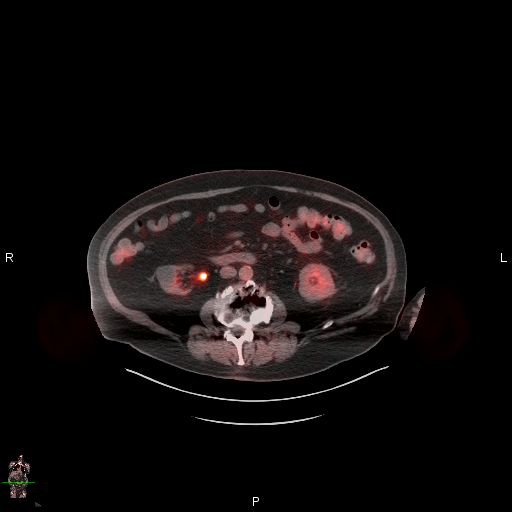
[im 141/211]
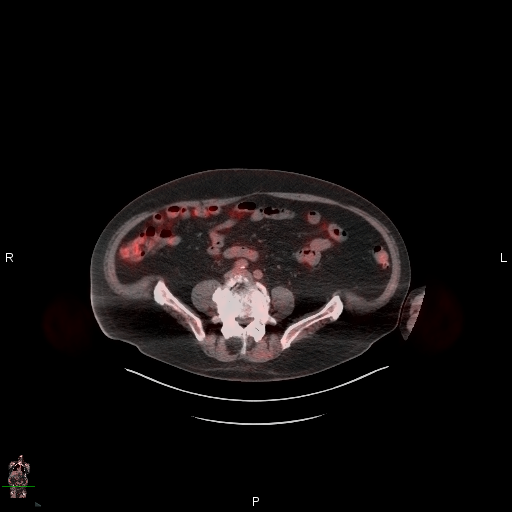
[im 155/211]
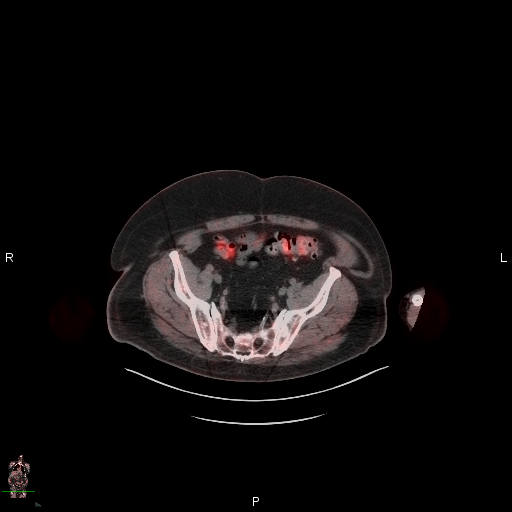
[im 183/211]
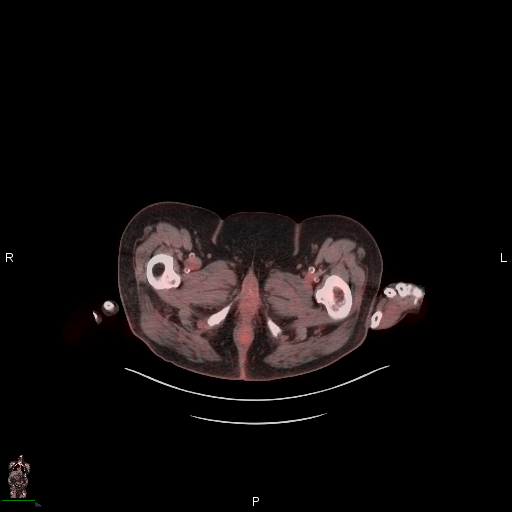
[im 197/211]
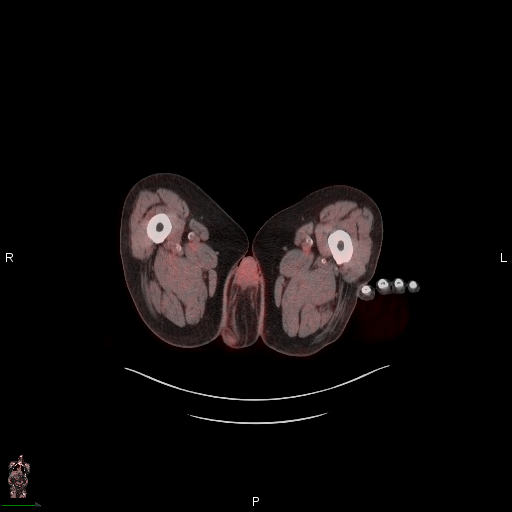
[im 211/211]
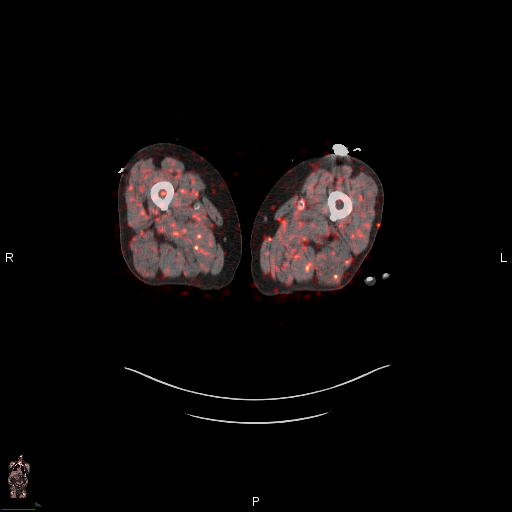

[mip · 4 of 48 slices shown]
[im 1/48]
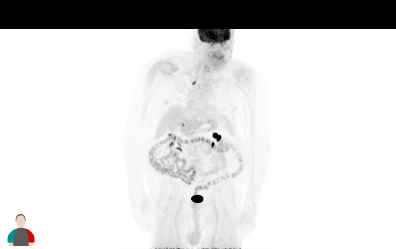
[im 16/48]
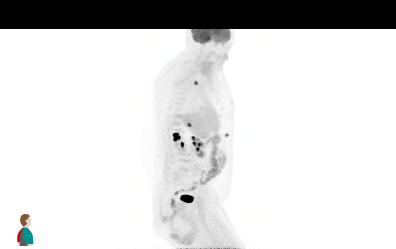
[im 32/48]
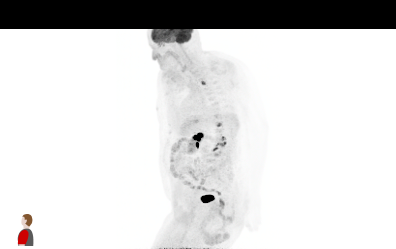
[im 48/48]
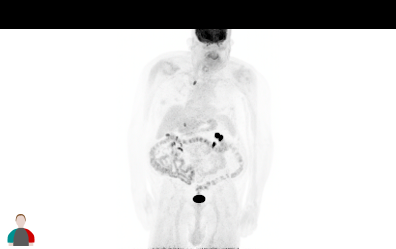

[coronal ct wb fusion · 4 of 66 slices shown]
[im 17/66]
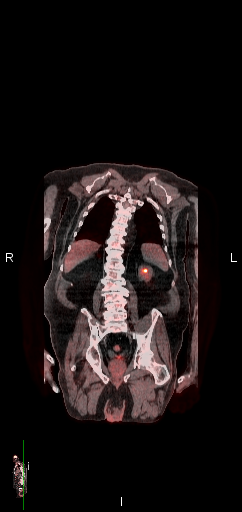
[im 33/66]
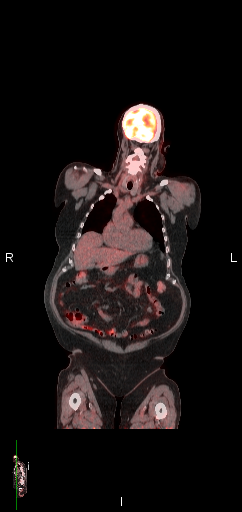
[im 49/66]
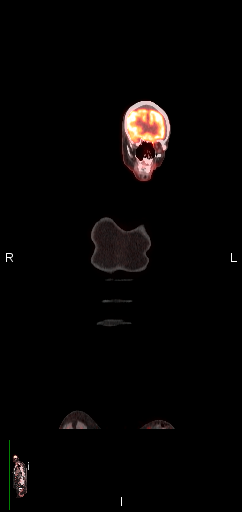
[im 66/66]
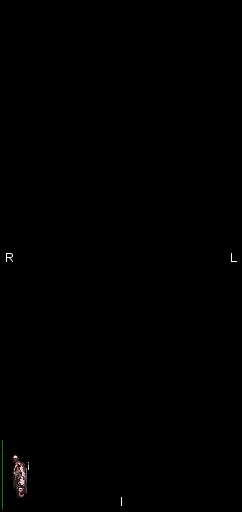

[22 of 25 positions shown; findings below may reference images not displayed]

FINDINGS: Mediastinal blood pool activity: SUV max

Liver activity: SUV max

NECK: No significant abnormal hypermetabolic activity in this
region.

Incidental CT findings: Findings of chronic atrophy intracranially
not appreciably changed prior MRI examinations. Bilateral common
carotid atherosclerotic calcifications.

CHEST: The dominant right upper lobe pulmonary nodule is blurred by
motion artifact, but approximately 1.5 by 0.8 cm on image 143/3,
with maximum SUV 9.5, formerly 5.1.

The right perifissural nodule is obscured by motion artifact and
accordingly difficult to measure, but has no appreciable metabolic
activity.

Incidental CT findings: Atherosclerotic calcification of the aortic
arch.

ABDOMEN/PELVIS: New hypermetabolic mass of the right adrenal gland
measuring 1.9 by 1.2 cm on image 218/3, maximum SUV 9.2, favoring
metastatic lesion.

Subcutaneous soft tissue nodule along the right anterior abdominal
wall just below the margin of the rib cage measures 1.9 by 1.5 cm on
image 229/3 with maximum SUV 7.0, formerly 1.6 by 1.4 cm with
maximum SUV formerly 5.2.

Incidental CT findings: Cholelithiasis. Right renal scarring.
Bilateral nonobstructive nephrolithiasis including a 1.8 cm left
renal staghorn calculus. Photopenic cyst in the right kidney lower
pole.

Aortoiliac atherosclerotic vascular disease. Sigmoid colon
diverticulosis. Prostatomegaly.

SKELETON: Scattered rim sclerotic lesions in the skeleton, for
example in the spine, bony pelvis, and left proximal humerus, are
not hypermetabolic.

Incidental CT findings: Thoracic and lumbar spondylosis. Old
deformity of the sternal manubrium.
IMPRESSION: 1. New hypermetabolic right adrenal mass with increase in activity
and size of the right upper lobe pulmonary nodule and of the
subcutaneous nodule along the right anterior abdominal wall,
compatible with progression of malignancy.
2. Numerous scattered rim sclerotic lesions in the skeleton,
previously hypermetabolic back on 11/22/2017, currently not
demonstrating abnormal metabolic activity, likely representing
effectively treated osseous prior metastatic disease.
3. Other imaging findings of potential clinical significance: Aortic
Atherosclerosis ([id number]). Cholelithiasis. Bilateral
nonobstructive nephrolithiasis including a left renal staghorn
calculus. Prostatomegaly. Sigmoid colon diverticulosis. Old
deformities of the sternal manubrium and right upper ribs.
